# Patient Record
Sex: Male | Born: 1945
Health system: Southern US, Community
[De-identification: ages and names within clinical notes are randomized; demographics above are authoritative.]

## PROBLEM LIST (undated history)

## (undated) DIAGNOSIS — M2550 Pain in unspecified joint: Secondary | ICD-10-CM

## (undated) DIAGNOSIS — F32A Depression, unspecified: Secondary | ICD-10-CM

## (undated) DIAGNOSIS — H269 Unspecified cataract: Secondary | ICD-10-CM

## (undated) DIAGNOSIS — R413 Other amnesia: Secondary | ICD-10-CM

## (undated) DIAGNOSIS — M109 Gout, unspecified: Secondary | ICD-10-CM

## (undated) DIAGNOSIS — F419 Anxiety disorder, unspecified: Secondary | ICD-10-CM

## (undated) DIAGNOSIS — I10 Essential (primary) hypertension: Secondary | ICD-10-CM

## (undated) DIAGNOSIS — K5790 Diverticulosis of intestine, part unspecified, without perforation or abscess without bleeding: Secondary | ICD-10-CM

## (undated) DIAGNOSIS — Z8709 Personal history of other diseases of the respiratory system: Secondary | ICD-10-CM

## (undated) DIAGNOSIS — G5139 Clonic hemifacial spasm, unspecified: Secondary | ICD-10-CM

## (undated) DIAGNOSIS — E079 Disorder of thyroid, unspecified: Secondary | ICD-10-CM

## (undated) DIAGNOSIS — M1990 Unspecified osteoarthritis, unspecified site: Secondary | ICD-10-CM

## (undated) HISTORY — DX: Gout, unspecified: M10.9

## (undated) HISTORY — DX: Unspecified cataract: H26.9

## (undated) HISTORY — PX: OTHER SURGICAL HISTORY: SHX169

## (undated) HISTORY — PX: JOINT REPLACEMENT: SHX530

## (undated) HISTORY — DX: Depression, unspecified: F32.A

## (undated) HISTORY — DX: Other amnesia: R41.3

## (undated) HISTORY — PX: CATARACT EXTRACTION, BILATERAL: SHX1313

## (undated) HISTORY — PX: HERNIA REPAIR: SHX51

## (undated) HISTORY — DX: Disorder of thyroid, unspecified: E07.9

## (undated) HISTORY — PX: COLONOSCOPY: SHX174

## (undated) HISTORY — DX: Anxiety disorder, unspecified: F41.9

---

## 2001-08-29 ENCOUNTER — Ambulatory Visit (HOSPITAL_COMMUNITY): Admission: RE | Admit: 2001-08-29 | Discharge: 2001-08-29 | Payer: Self-pay | Admitting: Family Medicine

## 2001-08-29 ENCOUNTER — Encounter: Payer: Self-pay | Admitting: Family Medicine

## 2001-09-09 ENCOUNTER — Encounter: Payer: Self-pay | Admitting: Family Medicine

## 2001-09-09 ENCOUNTER — Ambulatory Visit (HOSPITAL_COMMUNITY): Admission: RE | Admit: 2001-09-09 | Discharge: 2001-09-09 | Payer: Self-pay | Admitting: Family Medicine

## 2003-03-03 ENCOUNTER — Encounter: Payer: Self-pay | Admitting: Orthopaedic Surgery

## 2003-03-03 ENCOUNTER — Ambulatory Visit (HOSPITAL_COMMUNITY): Admission: RE | Admit: 2003-03-03 | Discharge: 2003-03-03 | Payer: Self-pay | Admitting: Orthopaedic Surgery

## 2003-04-06 ENCOUNTER — Ambulatory Visit (HOSPITAL_COMMUNITY): Admission: RE | Admit: 2003-04-06 | Discharge: 2003-04-06 | Payer: Self-pay | Admitting: Family Medicine

## 2003-04-06 ENCOUNTER — Encounter: Payer: Self-pay | Admitting: Family Medicine

## 2004-05-30 ENCOUNTER — Ambulatory Visit (HOSPITAL_COMMUNITY): Admission: RE | Admit: 2004-05-30 | Discharge: 2004-05-30 | Payer: Self-pay | Admitting: Family Medicine

## 2006-05-29 ENCOUNTER — Ambulatory Visit (HOSPITAL_COMMUNITY): Admission: RE | Admit: 2006-05-29 | Discharge: 2006-05-29 | Payer: Self-pay | Admitting: Family Medicine

## 2006-06-26 ENCOUNTER — Ambulatory Visit (HOSPITAL_COMMUNITY): Admission: RE | Admit: 2006-06-26 | Discharge: 2006-06-26 | Payer: Self-pay | Admitting: General Surgery

## 2010-06-06 ENCOUNTER — Ambulatory Visit (HOSPITAL_COMMUNITY): Admission: RE | Admit: 2010-06-06 | Discharge: 2010-06-06 | Payer: Self-pay | Admitting: Family Medicine

## 2010-12-07 ENCOUNTER — Ambulatory Visit (INDEPENDENT_AMBULATORY_CARE_PROVIDER_SITE_OTHER): Payer: Medicare Other | Admitting: Otolaryngology

## 2010-12-07 DIAGNOSIS — J310 Chronic rhinitis: Secondary | ICD-10-CM

## 2010-12-07 DIAGNOSIS — R070 Pain in throat: Secondary | ICD-10-CM

## 2010-12-07 DIAGNOSIS — R05 Cough: Secondary | ICD-10-CM

## 2011-01-04 ENCOUNTER — Ambulatory Visit (INDEPENDENT_AMBULATORY_CARE_PROVIDER_SITE_OTHER): Payer: Medicare Other | Admitting: Otolaryngology

## 2011-01-04 DIAGNOSIS — H902 Conductive hearing loss, unspecified: Secondary | ICD-10-CM

## 2011-01-04 DIAGNOSIS — H6120 Impacted cerumen, unspecified ear: Secondary | ICD-10-CM

## 2011-01-04 DIAGNOSIS — J310 Chronic rhinitis: Secondary | ICD-10-CM

## 2011-03-09 NOTE — H&P (Signed)
NAME:  Erik Odonnell, Erik Odonnell                ACCOUNT NO.:  [id number]   MEDICAL RECORD NO.:  [id number]          PATIENT TYPE:  AMB   LOCATION:                                FACILITY:  [facility]   PHYSICIAN:  Dalia Heading, M.D.  DATE OF BIRTH:  [birth year]   DATE OF ADMISSION:  DATE OF DISCHARGE:  LH                                HISTORY & PHYSICAL   CHIEF COMPLAINT:  Need for screening colonoscopy.   HISTORY OF PRESENT ILLNESS:  Patient is a 65 year old white male who is  referred for endoscopic evaluation.  He needs a colonoscopy for screening  purposes.  No abdominal pain, weight loss, nausea, vomiting, diarrhea,  constipation, melena, hematochezia have been noted.  He has never had a  colonoscopy.  There is no family history of colon carcinoma.   PAST MEDICAL HISTORY:  Hypertension and gout.   PAST SURGICAL HISTORY:  Eye surgery, herniorrhaphy.   CURRENT MEDICATIONS:  Ziac, allopurinol, Artane, baby aspirin.   ALLERGIES:  No known drug allergies.   REVIEW OF SYSTEMS:  Noncontributory.   PHYSICAL EXAMINATION:  GENERAL:  Patient is a well-developed, well-nourished  white male in no acute distress.  LUNGS:  Clear to auscultation with equal breath sounds bilaterally.  HEART:  Regular rate and rhythm without S3, S4, or murmurs.  ABDOMEN:  Soft, nontender, nondistended.  No hepatosplenomegaly or masses  noted.  RECTAL:  Deferred to the procedure.   IMPRESSION:  Need for screening colonoscopy.   PLAN:  The patient is scheduled for a colonoscopy on June 20, 2006.  The  risks and benefits of the procedure including bleeding and perforation were  fully explained to the patient, gave informed consent.      Dalia Heading, M.D.  Electronically Signed     MAJ/MEDQ  D:  06/11/2006  T:  06/11/2006  Job:  [id number]   cc:   Kirk Ruths, M.D.  Fax: [phone]

## 2012-02-04 ENCOUNTER — Other Ambulatory Visit (HOSPITAL_COMMUNITY): Payer: Self-pay | Admitting: Family Medicine

## 2012-02-04 ENCOUNTER — Ambulatory Visit (HOSPITAL_COMMUNITY)
Admission: RE | Admit: 2012-02-04 | Discharge: 2012-02-04 | Disposition: A | Discharge status: Home / Self Care | Payer: Medicare Other | Source: Ambulatory Visit | Attending: Family Medicine | Admitting: Family Medicine

## 2012-02-04 DIAGNOSIS — R05 Cough: Secondary | ICD-10-CM

## 2012-02-04 DIAGNOSIS — J309 Allergic rhinitis, unspecified: Secondary | ICD-10-CM | POA: Insufficient documentation

## 2012-02-04 DIAGNOSIS — I10 Essential (primary) hypertension: Secondary | ICD-10-CM | POA: Insufficient documentation

## 2012-02-04 DIAGNOSIS — R059 Cough, unspecified: Secondary | ICD-10-CM | POA: Insufficient documentation

## 2012-12-16 ENCOUNTER — Other Ambulatory Visit (HOSPITAL_COMMUNITY): Payer: Self-pay | Admitting: Family Medicine

## 2013-02-19 ENCOUNTER — Ambulatory Visit (HOSPITAL_COMMUNITY)
Admission: RE | Admit: 2013-02-19 | Discharge: 2013-02-19 | Disposition: A | Discharge status: Home / Self Care | Payer: Medicare Other | Source: Ambulatory Visit | Attending: Family Medicine | Admitting: Family Medicine

## 2013-02-19 DIAGNOSIS — Z1389 Encounter for screening for other disorder: Secondary | ICD-10-CM | POA: Insufficient documentation

## 2013-02-19 DIAGNOSIS — Z0000 Encounter for general adult medical examination without abnormal findings: Secondary | ICD-10-CM

## 2013-02-19 DIAGNOSIS — I77819 Aortic ectasia, unspecified site: Secondary | ICD-10-CM | POA: Insufficient documentation

## 2013-02-19 DIAGNOSIS — I10 Essential (primary) hypertension: Secondary | ICD-10-CM | POA: Insufficient documentation

## 2013-03-23 IMAGING — CR DG CHEST 2V
2 series · 2 of 2 positions shown · non-contrast
Comparison: 06/06/2010

CLINICAL DATA: Cough and allergic rhinitis

CHEST - 2 VIEW

[view not recorded (1 of 2)]
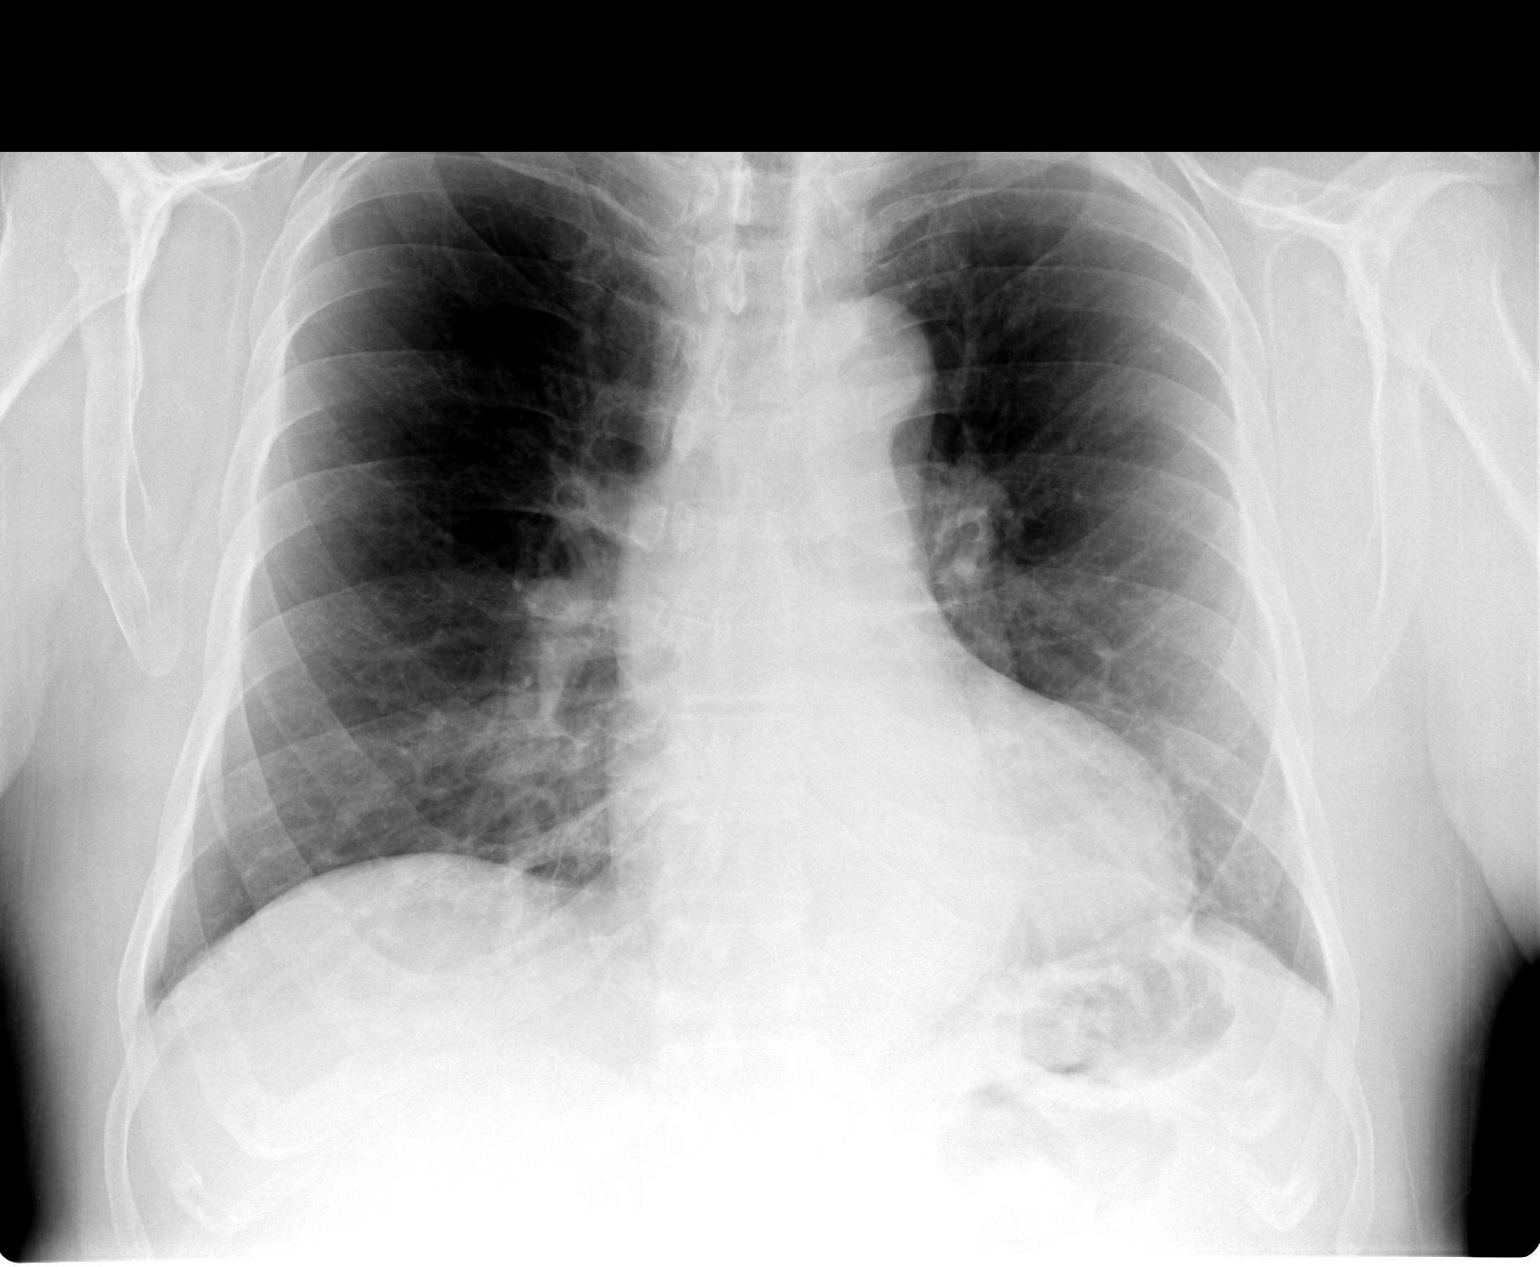

[view not recorded (2 of 2)]
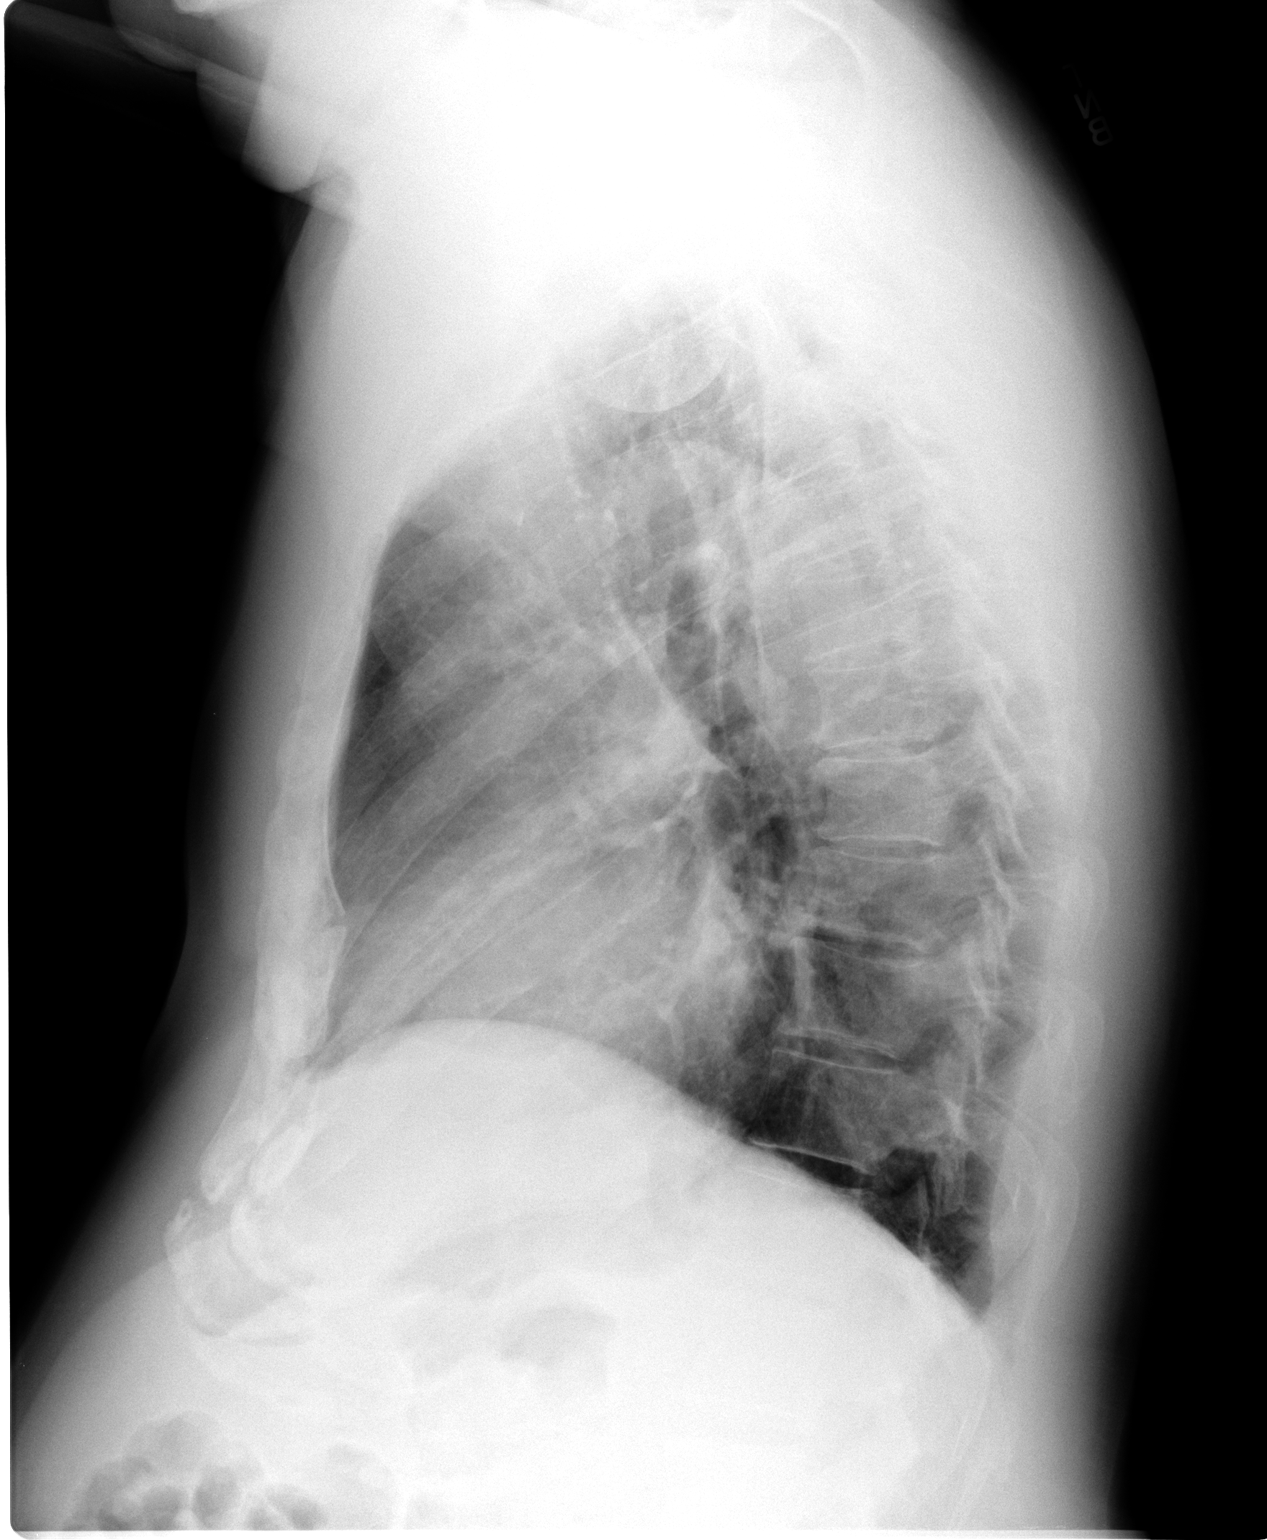

[2 of 2 positions shown; findings below may reference images not displayed]

FINDINGS: The heart size and mediastinal contours are within normal
limits.  Both lungs are clear.  The visualized skeletal structures
are unremarkable.
IMPRESSION: No active cardiopulmonary abnormalities.

## 2013-11-22 DIAGNOSIS — Z8709 Personal history of other diseases of the respiratory system: Secondary | ICD-10-CM

## 2013-11-22 HISTORY — DX: Personal history of other diseases of the respiratory system: Z87.09

## 2013-11-23 ENCOUNTER — Other Ambulatory Visit (HOSPITAL_COMMUNITY): Payer: Self-pay | Admitting: Internal Medicine

## 2013-11-23 ENCOUNTER — Ambulatory Visit (HOSPITAL_COMMUNITY)
Admission: RE | Admit: 2013-11-23 | Discharge: 2013-11-23 | Disposition: A | Discharge status: Home / Self Care | Payer: Medicare Other | Source: Ambulatory Visit | Attending: Internal Medicine | Admitting: Internal Medicine

## 2013-11-23 DIAGNOSIS — R05 Cough: Secondary | ICD-10-CM

## 2013-11-23 DIAGNOSIS — R059 Cough, unspecified: Secondary | ICD-10-CM

## 2013-11-23 DIAGNOSIS — R0602 Shortness of breath: Secondary | ICD-10-CM | POA: Insufficient documentation

## 2013-11-23 DIAGNOSIS — R079 Chest pain, unspecified: Secondary | ICD-10-CM | POA: Insufficient documentation

## 2014-03-08 ENCOUNTER — Other Ambulatory Visit: Payer: Self-pay | Admitting: Physician Assistant

## 2014-03-08 NOTE — H&P (Signed)
TOTAL HIP ADMISSION H&P  Patient is admitted for right total hip arthroplasty.  Subjective:  Chief Complaint: right hip pain  HPI: Erik Odonnell, 68 y.o. male, has a history of pain and functional disability in the right hip(s) due to arthritis and patient has failed non-surgical conservative treatments for greater than 12 weeks to include NSAID's and/or analgesics, corticosteriod injections and activity modification.  Onset of symptoms was abrupt starting 1 years ago with rapidlly worsening course since that time.The patient noted no past surgery on the right hip(s).  Patient currently rates pain in the right hip at 5 out of 10 with activity. Patient has night pain, worsening of pain with activity and weight bearing, trendelenberg gait and pain that interfers with activities of daily living. Patient has evidence of subchondral sclerosis and joint space narrowing by imaging studies. This condition presents safety issues increasing the risk of falls.  There is no current active infection.  There are no active problems to display for this patient.  No past medical history on file.  No past surgical history on file.   (Not in a hospital admission) Allergies not on file  History  Substance Use Topics  . Smoking status: Not on file  . Smokeless tobacco: Not on file  . Alcohol Use: Not on file    No family history on file.   Review of Systems  Constitutional: Negative.   HENT: Negative.   Eyes: Negative.   Respiratory: Negative.   Cardiovascular: Negative.   Gastrointestinal: Negative.   Genitourinary: Negative.   Musculoskeletal: Positive for joint pain.  Skin: Negative.   Neurological: Negative.   Endo/Heme/Allergies: Negative.   Psychiatric/Behavioral: Negative.     Objective:  Physical Exam  Constitutional: He is oriented to person, place, and time. He appears well-developed and well-nourished.  HENT:  Head: Normocephalic and atraumatic.  Eyes: EOM are normal. Pupils are  equal, round, and reactive to light.  Neck: Normal range of motion. Neck supple.  Cardiovascular: Normal rate and regular rhythm.  Exam reveals no gallop and no friction rub.   No murmur heard. Respiratory: Effort normal and breath sounds normal. No respiratory distress. He has no wheezes. He has no rales.  GI: Soft. Bowel sounds are normal. He exhibits no distension. There is no tenderness.  Musculoskeletal:  Antalgic gait Trendelenburg component on the right. Some decreased lumbar motion not extreme. He is neurovascularly intact distally. Markedly positive log roll right hip with very little internal rotation. The left hip has fairly good motion mildly positive log roll nothing extreme. Both knees have good alignment and stability. He is neurovascularly intact distally.  Neurological: He is alert and oriented to person, place, and time.  Skin: Skin is warm and dry.  Psychiatric: He has a normal mood and affect. His behavior is normal. Judgment and thought content normal.    Vital signs in last 24 hours: @VSRANGES @  Labs:   There is no height or weight on file to calculate BMI.   Imaging Review Plain radiographs demonstrate severe degenerative joint disease of the right hip(s). The bone quality appears to be fair for age and reported activity level.  Assessment/Plan:  End stage arthritis, right hip(s)  The patient history, physical examination, clinical judgement of the provider and imaging studies are consistent with end stage degenerative joint disease of the right hip(s) and total hip arthroplasty is deemed medically necessary. The treatment options including medical management, injection therapy, arthroscopy and arthroplasty were discussed at length. The risks and benefits  of total hip arthroplasty were presented and reviewed. The risks due to aseptic loosening, infection, stiffness, dislocation/subluxation,  thromboembolic complications and other imponderables were discussed.  The  patient acknowledged the explanation, agreed to proceed with the plan and consent was signed. Patient is being admitted for inpatient treatment for surgery, pain control, PT, OT, prophylactic antibiotics, VTE prophylaxis, progressive ambulation and ADL's and discharge planning.The patient is planning to be discharged home with home health services

## 2014-03-16 ENCOUNTER — Encounter (HOSPITAL_COMMUNITY): Payer: Self-pay | Admitting: Pharmacy Technician

## 2014-03-17 ENCOUNTER — Encounter (HOSPITAL_COMMUNITY)
Admission: RE | Admit: 2014-03-17 | Discharge: 2014-03-17 | Disposition: A | Discharge status: Home / Self Care | Payer: Medicare Other | Source: Ambulatory Visit | Attending: Orthopedic Surgery | Admitting: Orthopedic Surgery

## 2014-03-17 ENCOUNTER — Encounter (HOSPITAL_COMMUNITY): Payer: Self-pay

## 2014-03-17 DIAGNOSIS — Z01812 Encounter for preprocedural laboratory examination: Secondary | ICD-10-CM | POA: Insufficient documentation

## 2014-03-17 HISTORY — DX: Unspecified osteoarthritis, unspecified site: M19.90

## 2014-03-17 HISTORY — DX: Personal history of other diseases of the respiratory system: Z87.09

## 2014-03-17 HISTORY — DX: Essential (primary) hypertension: I10

## 2014-03-17 HISTORY — DX: Diverticulosis of intestine, part unspecified, without perforation or abscess without bleeding: K57.90

## 2014-03-17 HISTORY — DX: Clonic hemifacial spasm, unspecified: G51.39

## 2014-03-17 HISTORY — DX: Pain in unspecified joint: M25.50

## 2014-03-17 LAB — COMPREHENSIVE METABOLIC PANEL
ALBUMIN: 4.1 g/dL (ref 3.5–5.2)
ALT: 25 U/L (ref 0–53)
AST: 24 U/L (ref 0–37)
Alkaline Phosphatase: 64 U/L (ref 39–117)
BUN: 18 mg/dL (ref 6–23)
CHLORIDE: 96 meq/L (ref 96–112)
CO2: 24 mEq/L (ref 19–32)
CREATININE: 1.03 mg/dL (ref 0.50–1.35)
Calcium: 9.3 mg/dL (ref 8.4–10.5)
GFR calc Af Amer: 84 mL/min — ABNORMAL LOW (ref >90)
GFR calc non Af Amer: 72 mL/min — ABNORMAL LOW (ref >90)
GLUCOSE: 95 mg/dL (ref 70–99)
Potassium: 4.2 mEq/L (ref 3.7–5.3)
Sodium: 134 mEq/L — ABNORMAL LOW (ref 137–147)
Total Bilirubin: 0.5 mg/dL (ref 0.3–1.2)
Total Protein: 7.5 g/dL (ref 6.0–8.3)

## 2014-03-17 LAB — CBC WITH DIFFERENTIAL/PLATELET
BASOS PCT: 1 % (ref 0–1)
Basophils Absolute: 0.1 10*3/uL (ref 0.0–0.1)
EOS ABS: 0.2 10*3/uL (ref 0.0–0.7)
Eosinophils Relative: 3 % (ref 0–5)
HEMATOCRIT: 45.1 % (ref 39.0–52.0)
HEMOGLOBIN: 16 g/dL (ref 13.0–17.0)
LYMPHS ABS: 1.6 10*3/uL (ref 0.7–4.0)
Lymphocytes Relative: 22 % (ref 12–46)
MCH: 34.3 pg — AB (ref 26.0–34.0)
MCHC: 35.5 g/dL (ref 30.0–36.0)
MCV: 96.6 fL (ref 78.0–100.0)
MONO ABS: 0.6 10*3/uL (ref 0.1–1.0)
MONOS PCT: 8 % (ref 3–12)
NEUTROS ABS: 4.8 10*3/uL (ref 1.7–7.7)
Neutrophils Relative %: 66 % (ref 43–77)
Platelets: 198 10*3/uL (ref 150–400)
RBC: 4.67 MIL/uL (ref 4.22–5.81)
RDW: 13.7 % (ref 11.5–15.5)
WBC: 7.3 10*3/uL (ref 4.0–10.5)

## 2014-03-17 LAB — URINALYSIS, ROUTINE W REFLEX MICROSCOPIC
Bilirubin Urine: NEGATIVE
Glucose, UA: NEGATIVE mg/dL
Hgb urine dipstick: NEGATIVE
KETONES UR: NEGATIVE mg/dL
Leukocytes, UA: NEGATIVE
NITRITE: NEGATIVE
PH: 6.5 (ref 5.0–8.0)
Protein, ur: NEGATIVE mg/dL
Specific Gravity, Urine: 1.016 (ref 1.005–1.030)
Urobilinogen, UA: 0.2 mg/dL (ref 0.0–1.0)

## 2014-03-17 LAB — ABO/RH: ABO/RH(D): O POS

## 2014-03-17 LAB — PROTIME-INR
INR: 0.98 (ref 0.00–1.49)
PROTHROMBIN TIME: 12.8 s (ref 11.6–15.2)

## 2014-03-17 LAB — SURGICAL PCR SCREEN
MRSA, PCR: NEGATIVE
Staphylococcus aureus: NEGATIVE

## 2014-03-17 LAB — TYPE AND SCREEN
ABO/RH(D): O POS
Antibody Screen: NEGATIVE

## 2014-03-17 LAB — APTT: aPTT: 32 seconds (ref 24–37)

## 2014-03-17 MED ORDER — CHLORHEXIDINE GLUCONATE 4 % EX LIQD: 60.0000 mL | Freq: Once | CUTANEOUS | Status: DC

## 2014-03-17 NOTE — Progress Notes (Signed)
03/17/14 1154  OBSTRUCTIVE SLEEP APNEA  Have you ever been diagnosed with sleep apnea through a sleep study? No  Do you snore loudly (loud enough to be heard through closed doors)?  0  Do you often feel tired, fatigued, or sleepy during the daytime? 0  Has anyone observed you stop breathing during your sleep? 0  Do you have, or are you being treated for high blood pressure? 1  BMI more than 35 kg/m2? 0  Age over 70 years old? 1  Neck circumference greater than 40 cm/16 inches? 1 (18.5)  Gender: 1  Obstructive Sleep Apnea Score 4  Score 4 or greater  Results sent to PCP

## 2014-03-17 NOTE — Pre-Procedure Instructions (Signed)
OLLY SHINER  03/17/2014   Your procedure is scheduled on:  Wed, June 3 @ 11:00 AM  Report to [facility]  at 8:00 AM.  Call this number if you have problems the morning of surgery: [phone]   Remember:   Do not eat food or drink liquids after midnight.   Take these medicines the morning of surgery with A SIP OF WATER: Allopurinol(Zyloprim) and Bisoprolol-HCTZ(Ziac)               Stop taking your Aspirin.No Goody's,BC's,Aleve,Ibuprofen,Fish Oil,or any Herbal Medications   Do not wear jewelry  Do not wear lotions, powders, or colognes. You may wear deodorant.  Men may shave face and neck.  Do not bring valuables to the hospital.  [facility] is not responsible                  for any belongings or valuables.               Contacts, dentures or bridgework may not be worn into surgery.  Leave suitcase in the car. After surgery it may be brought to your room.  For patients admitted to the hospital, discharge time is determined by your                treatment team.               Patients discharged the day of surgery will not be allowed to drive  home.    Special Instructions:   - Preparing for Surgery  Before surgery, you can play an important role.  Because skin is not sterile, your skin needs to be as free of germs as possible.  You can reduce the number of germs on you skin by washing with CHG (chlorahexidine gluconate) soap before surgery.  CHG is an antiseptic cleaner which kills germs and bonds with the skin to continue killing germs even after washing.  Please DO NOT use if you have an allergy to CHG or antibacterial soaps.  If your skin becomes reddened/irritated stop using the CHG and inform your nurse when you arrive at Short Stay.  Do not shave (including legs and underarms) for at least 48 hours prior to the first CHG shower.  You may shave your face.  Please follow these instructions carefully:   1.  Shower with CHG Soap the night before  surgery and the                                morning of Surgery.  2.  If you choose to wash your hair, wash your hair first as usual with your       normal shampoo.  3.  After you shampoo, rinse your hair and body thoroughly to remove the                      Shampoo.  4.  Use CHG as you would any other liquid soap.  You can apply chg directly       to the skin and wash gently with scrungie or a clean washcloth.  5.  Apply the CHG Soap to your body ONLY FROM THE NECK DOWN.        Do not use on open wounds or open sores.  Avoid contact with your eyes,       ears, mouth and genitals (private parts).  Wash genitals (  private parts)       with your normal soap.  6.  Wash thoroughly, paying special attention to the area where your surgery        will be performed.  7.  Thoroughly rinse your body with warm water from the neck down.  8.  DO NOT shower/wash with your normal soap after using and rinsing off       the CHG Soap.  9.  Pat yourself dry with a clean towel.            10.  Wear clean pajamas.            11.  Place clean sheets on your bed the night of your first shower and do not        sleep with pets.  Day of Surgery  Do not apply any lotions/deoderants the morning of surgery.  Please wear clean clothes to the hospital/surgery center.     Please read over the following fact sheets that you were given: Pain Booklet, Coughing and Deep Breathing, Blood Transfusion Information, MRSA Information and Surgical Site Infection Prevention

## 2014-03-17 NOTE — Progress Notes (Addendum)
Pt doesn't have a cardiologist  Stress test done 12+yrs ago    Denies ever having a heart cath or echo  CXR in epic from 11-23-13  EKG to be requested from [place] with [facility]

## 2014-03-18 LAB — URINE CULTURE
CULTURE: NO GROWTH
Colony Count: NO GROWTH

## 2014-03-23 MED ORDER — CEFAZOLIN SODIUM-DEXTROSE 2-3 GM-% IV SOLR
2.0000 g | INTRAVENOUS | Status: AC
  Administered 2014-03-24: 2 g via INTRAVENOUS
  Filled 2014-03-23: qty 50

## 2014-03-23 NOTE — Progress Notes (Signed)
Patient called with time change. To arrive at 645am

## 2014-03-24 ENCOUNTER — Encounter (HOSPITAL_COMMUNITY): Payer: Self-pay | Admitting: Certified Registered Nurse Anesthetist

## 2014-03-24 ENCOUNTER — Encounter (HOSPITAL_COMMUNITY): Payer: Medicare Other | Admitting: Anesthesiology

## 2014-03-24 ENCOUNTER — Inpatient Hospital Stay (HOSPITAL_COMMUNITY): Payer: Medicare Other | Admitting: Anesthesiology

## 2014-03-24 ENCOUNTER — Encounter (HOSPITAL_COMMUNITY)
Admission: RE | Disposition: A | Discharge status: Home Health | Payer: Self-pay | Source: Ambulatory Visit | Attending: Orthopedic Surgery

## 2014-03-24 ENCOUNTER — Inpatient Hospital Stay (HOSPITAL_COMMUNITY): Payer: Medicare Other

## 2014-03-24 ENCOUNTER — Inpatient Hospital Stay (HOSPITAL_COMMUNITY)
Admission: RE | Admit: 2014-03-24 | Discharge: 2014-03-25 | DRG: 470 | Disposition: A | Discharge status: Home Health | Payer: Medicare Other | Source: Ambulatory Visit | Attending: Orthopedic Surgery | Admitting: Orthopedic Surgery

## 2014-03-24 DIAGNOSIS — G518 Other disorders of facial nerve: Secondary | ICD-10-CM | POA: Diagnosis present

## 2014-03-24 DIAGNOSIS — I10 Essential (primary) hypertension: Secondary | ICD-10-CM | POA: Diagnosis present

## 2014-03-24 DIAGNOSIS — Z96649 Presence of unspecified artificial hip joint: Secondary | ICD-10-CM

## 2014-03-24 DIAGNOSIS — M1610 Unilateral primary osteoarthritis, unspecified hip: Principal | ICD-10-CM | POA: Diagnosis present

## 2014-03-24 DIAGNOSIS — M169 Osteoarthritis of hip, unspecified: Principal | ICD-10-CM | POA: Diagnosis present

## 2014-03-24 HISTORY — PX: TOTAL HIP ARTHROPLASTY: SHX124

## 2014-03-24 SURGERY — ARTHROPLASTY, HIP, TOTAL, ANTERIOR APPROACH
Anesthesia: General | Laterality: Right

## 2014-03-24 MED ORDER — METOCLOPRAMIDE HCL 10 MG PO TABS: 5.0000 mg | ORAL_TABLET | Freq: Three times a day (TID) | ORAL | Status: DC | PRN

## 2014-03-24 MED ORDER — NEOSTIGMINE METHYLSULFATE 10 MG/10ML IV SOLN
INTRAVENOUS | Status: DC | PRN
  Administered 2014-03-24: 4 mg via INTRAVENOUS

## 2014-03-24 MED ORDER — DOCUSATE SODIUM 100 MG PO CAPS
100.0000 mg | ORAL_CAPSULE | Freq: Two times a day (BID) | ORAL | Status: DC
  Administered 2014-03-24 – 2014-03-25 (×2): 100 mg via ORAL
  Filled 2014-03-24 (×2): qty 1

## 2014-03-24 MED ORDER — LACTATED RINGERS IV SOLN
INTRAVENOUS | Status: DC | PRN
  Administered 2014-03-24 (×2): via INTRAVENOUS

## 2014-03-24 MED ORDER — ACETAMINOPHEN 650 MG RE SUPP: 650.0000 mg | Freq: Four times a day (QID) | RECTAL | Status: DC | PRN

## 2014-03-24 MED ORDER — ROCURONIUM BROMIDE 50 MG/5ML IV SOLN
INTRAVENOUS | Status: AC
  Filled 2014-03-24: qty 1

## 2014-03-24 MED ORDER — FENTANYL CITRATE 0.05 MG/ML IJ SOLN
INTRAMUSCULAR | Status: AC
  Filled 2014-03-24: qty 5

## 2014-03-24 MED ORDER — CELECOXIB 200 MG PO CAPS
200.0000 mg | ORAL_CAPSULE | Freq: Two times a day (BID) | ORAL | Status: DC
  Administered 2014-03-24 – 2014-03-25 (×2): 200 mg via ORAL
  Filled 2014-03-24 (×4): qty 1

## 2014-03-24 MED ORDER — ONDANSETRON HCL 4 MG PO TABS: 4.0000 mg | ORAL_TABLET | Freq: Four times a day (QID) | ORAL | Status: DC | PRN

## 2014-03-24 MED ORDER — 0.9 % SODIUM CHLORIDE (POUR BTL) OPTIME
TOPICAL | Status: DC | PRN
  Administered 2014-03-24: 1000 mL

## 2014-03-24 MED ORDER — BUPIVACAINE LIPOSOME 1.3 % IJ SUSP
INTRAMUSCULAR | Status: DC | PRN
  Administered 2014-03-24: 20 mL

## 2014-03-24 MED ORDER — OXYCODONE-ACETAMINOPHEN 5-325 MG PO TABS: 1.0000 | ORAL_TABLET | ORAL | Status: DC | PRN

## 2014-03-24 MED ORDER — ALUM & MAG HYDROXIDE-SIMETH 200-200-20 MG/5ML PO SUSP: 30.0000 mL | ORAL | Status: DC | PRN

## 2014-03-24 MED ORDER — METHOCARBAMOL 1000 MG/10ML IJ SOLN
500.0000 mg | Freq: Four times a day (QID) | INTRAVENOUS | Status: DC | PRN
  Filled 2014-03-24: qty 5

## 2014-03-24 MED ORDER — ASPIRIN EC 325 MG PO TBEC
325.0000 mg | DELAYED_RELEASE_TABLET | Freq: Every day | ORAL | Status: DC
  Administered 2014-03-25: 325 mg via ORAL
  Filled 2014-03-24 (×2): qty 1

## 2014-03-24 MED ORDER — OXYCODONE HCL 5 MG PO TABS: 5.0000 mg | ORAL_TABLET | Freq: Once | ORAL | Status: DC | PRN

## 2014-03-24 MED ORDER — FENTANYL CITRATE 0.05 MG/ML IJ SOLN
INTRAMUSCULAR | Status: DC | PRN
  Administered 2014-03-24: 50 ug via INTRAVENOUS
  Administered 2014-03-24: 100 ug via INTRAVENOUS
  Administered 2014-03-24 (×2): 50 ug via INTRAVENOUS
  Administered 2014-03-24: 100 ug via INTRAVENOUS

## 2014-03-24 MED ORDER — ALBUMIN HUMAN 5 % IV SOLN
INTRAVENOUS | Status: DC | PRN
  Administered 2014-03-24: 12:00:00 via INTRAVENOUS

## 2014-03-24 MED ORDER — PROMETHAZINE HCL 25 MG/ML IJ SOLN: 6.2500 mg | INTRAMUSCULAR | Status: DC | PRN

## 2014-03-24 MED ORDER — MIDAZOLAM HCL 2 MG/2ML IJ SOLN
INTRAMUSCULAR | Status: AC
  Filled 2014-03-24: qty 2

## 2014-03-24 MED ORDER — TRIHEXYPHENIDYL HCL 2 MG PO TABS
2.0000 mg | ORAL_TABLET | Freq: Every day | ORAL | Status: DC
  Administered 2014-03-24 – 2014-03-25 (×2): 2 mg via ORAL
  Filled 2014-03-24 (×2): qty 1

## 2014-03-24 MED ORDER — METHOCARBAMOL 500 MG PO TABS
500.0000 mg | ORAL_TABLET | Freq: Four times a day (QID) | ORAL | Status: DC | PRN
  Administered 2014-03-24 (×2): 500 mg via ORAL
  Filled 2014-03-24: qty 1

## 2014-03-24 MED ORDER — SODIUM CHLORIDE 0.9 % IR SOLN
Status: DC | PRN
  Administered 2014-03-24: 40 mL

## 2014-03-24 MED ORDER — ROCURONIUM BROMIDE 100 MG/10ML IV SOLN
INTRAVENOUS | Status: DC | PRN
  Administered 2014-03-24: 50 mg via INTRAVENOUS
  Administered 2014-03-24 (×2): 10 mg via INTRAVENOUS
  Administered 2014-03-24: 5 mg via INTRAVENOUS

## 2014-03-24 MED ORDER — ONDANSETRON HCL 4 MG/2ML IJ SOLN
INTRAMUSCULAR | Status: AC
  Filled 2014-03-24: qty 2

## 2014-03-24 MED ORDER — ONDANSETRON HCL 4 MG/2ML IJ SOLN
INTRAMUSCULAR | Status: DC | PRN
  Administered 2014-03-24: 4 mg via INTRAVENOUS

## 2014-03-24 MED ORDER — METHOCARBAMOL 500 MG PO TABS: 500.0000 mg | ORAL_TABLET | Freq: Four times a day (QID) | ORAL | Status: DC

## 2014-03-24 MED ORDER — CEFAZOLIN SODIUM 1-5 GM-% IV SOLN
1.0000 g | Freq: Four times a day (QID) | INTRAVENOUS | Status: AC
  Administered 2014-03-24 (×2): 1 g via INTRAVENOUS
  Filled 2014-03-24 (×2): qty 50

## 2014-03-24 MED ORDER — SODIUM CHLORIDE 0.9 % IV SOLN
1000.0000 mg | INTRAVENOUS | Status: AC
  Administered 2014-03-24: 1000 mg via INTRAVENOUS
  Filled 2014-03-24: qty 10

## 2014-03-24 MED ORDER — DEXAMETHASONE SODIUM PHOSPHATE 4 MG/ML IJ SOLN
INTRAMUSCULAR | Status: DC | PRN
  Administered 2014-03-24: 8 mg via INTRAVENOUS

## 2014-03-24 MED ORDER — METOCLOPRAMIDE HCL 5 MG/ML IJ SOLN: 5.0000 mg | Freq: Three times a day (TID) | INTRAMUSCULAR | Status: DC | PRN

## 2014-03-24 MED ORDER — BISACODYL 5 MG PO TBEC: 5.0000 mg | DELAYED_RELEASE_TABLET | Freq: Every day | ORAL | Status: DC | PRN

## 2014-03-24 MED ORDER — POTASSIUM CHLORIDE IN NACL 20-0.9 MEQ/L-% IV SOLN
INTRAVENOUS | Status: DC
  Administered 2014-03-24: 16:00:00 via INTRAVENOUS
  Filled 2014-03-24 (×3): qty 1000

## 2014-03-24 MED ORDER — BISOPROLOL-HYDROCHLOROTHIAZIDE 2.5-6.25 MG PO TABS
1.0000 | ORAL_TABLET | Freq: Every day | ORAL | Status: DC
  Administered 2014-03-25: 1 via ORAL
  Filled 2014-03-24: qty 1

## 2014-03-24 MED ORDER — MENTHOL 3 MG MT LOZG: 1.0000 | LOZENGE | OROMUCOSAL | Status: DC | PRN

## 2014-03-24 MED ORDER — NEOSTIGMINE METHYLSULFATE 10 MG/10ML IV SOLN
INTRAVENOUS | Status: AC
  Filled 2014-03-24: qty 1

## 2014-03-24 MED ORDER — ACETAMINOPHEN 325 MG PO TABS: 650.0000 mg | ORAL_TABLET | Freq: Four times a day (QID) | ORAL | Status: DC | PRN

## 2014-03-24 MED ORDER — BUPIVACAINE LIPOSOME 1.3 % IJ SUSP
20.0000 mL | INTRAMUSCULAR | Status: DC
  Filled 2014-03-24: qty 20

## 2014-03-24 MED ORDER — ASPIRIN EC 325 MG PO TBEC: 325.0000 mg | DELAYED_RELEASE_TABLET | Freq: Every day | ORAL | Status: DC

## 2014-03-24 MED ORDER — MIDAZOLAM HCL 5 MG/5ML IJ SOLN
INTRAMUSCULAR | Status: DC | PRN
  Administered 2014-03-24: 2 mg via INTRAVENOUS

## 2014-03-24 MED ORDER — GLYCOPYRROLATE 0.2 MG/ML IJ SOLN
INTRAMUSCULAR | Status: AC
  Filled 2014-03-24: qty 3

## 2014-03-24 MED ORDER — GLYCOPYRROLATE 0.2 MG/ML IJ SOLN
INTRAMUSCULAR | Status: DC | PRN
  Administered 2014-03-24: 0.6 mg via INTRAVENOUS

## 2014-03-24 MED ORDER — LIDOCAINE HCL (CARDIAC) 20 MG/ML IV SOLN
INTRAVENOUS | Status: AC
  Filled 2014-03-24: qty 5

## 2014-03-24 MED ORDER — LACTATED RINGERS IV SOLN: INTRAVENOUS | Status: DC

## 2014-03-24 MED ORDER — PROPOFOL 10 MG/ML IV BOLUS
INTRAVENOUS | Status: AC
  Filled 2014-03-24: qty 20

## 2014-03-24 MED ORDER — OXYCODONE HCL 5 MG PO TABS
5.0000 mg | ORAL_TABLET | ORAL | Status: DC | PRN
  Administered 2014-03-24: 10 mg via ORAL
  Administered 2014-03-25: 5 mg via ORAL
  Filled 2014-03-24 (×2): qty 2

## 2014-03-24 MED ORDER — LACTATED RINGERS IV SOLN
INTRAVENOUS | Status: DC
  Administered 2014-03-24: 09:00:00 via INTRAVENOUS

## 2014-03-24 MED ORDER — PHENOL 1.4 % MT LIQD: 1.0000 | OROMUCOSAL | Status: DC | PRN

## 2014-03-24 MED ORDER — ONDANSETRON HCL 4 MG PO TABS: 4.0000 mg | ORAL_TABLET | Freq: Three times a day (TID) | ORAL | Status: DC | PRN

## 2014-03-24 MED ORDER — OXYCODONE HCL 5 MG/5ML PO SOLN: 5.0000 mg | Freq: Once | ORAL | Status: DC | PRN

## 2014-03-24 MED ORDER — METHOCARBAMOL 500 MG PO TABS
ORAL_TABLET | ORAL | Status: AC
  Filled 2014-03-24: qty 1

## 2014-03-24 MED ORDER — PROPOFOL 10 MG/ML IV BOLUS
INTRAVENOUS | Status: DC | PRN
  Administered 2014-03-24: 200 mg via INTRAVENOUS

## 2014-03-24 MED ORDER — DIPHENHYDRAMINE HCL 12.5 MG/5ML PO ELIX: 12.5000 mg | ORAL_SOLUTION | ORAL | Status: DC | PRN

## 2014-03-24 MED ORDER — ALLOPURINOL 300 MG PO TABS
300.0000 mg | ORAL_TABLET | Freq: Every day | ORAL | Status: DC
  Administered 2014-03-25: 300 mg via ORAL
  Filled 2014-03-24: qty 1

## 2014-03-24 MED ORDER — HYDROMORPHONE HCL PF 1 MG/ML IJ SOLN
0.5000 mg | INTRAMUSCULAR | Status: DC | PRN
  Administered 2014-03-24: 1 mg via INTRAVENOUS
  Filled 2014-03-24: qty 1

## 2014-03-24 MED ORDER — LIDOCAINE HCL (CARDIAC) 20 MG/ML IV SOLN
INTRAVENOUS | Status: DC | PRN
  Administered 2014-03-24: 100 mg via INTRAVENOUS

## 2014-03-24 MED ORDER — EPHEDRINE SULFATE 50 MG/ML IJ SOLN
INTRAMUSCULAR | Status: DC | PRN
  Administered 2014-03-24 (×2): 10 mg via INTRAVENOUS
  Administered 2014-03-24 (×2): 5 mg via INTRAVENOUS

## 2014-03-24 MED ORDER — HYDROMORPHONE HCL PF 1 MG/ML IJ SOLN: 0.2500 mg | INTRAMUSCULAR | Status: DC | PRN

## 2014-03-24 MED ORDER — ONDANSETRON HCL 4 MG/2ML IJ SOLN: 4.0000 mg | Freq: Four times a day (QID) | INTRAMUSCULAR | Status: DC | PRN

## 2014-03-24 SURGICAL SUPPLY — 53 items
BLADE SAW SGTL 18X1.27X75 (BLADE) ×2 IMPLANT
BLADE SAW SGTL 18X1.27X75MM (BLADE) ×1
BLADE SURG ROTATE 9660 (MISCELLANEOUS) IMPLANT
CATH URET WHISTLE 8FR 331008 (CATHETERS) ×3 IMPLANT
CLOSURE STERI-STRIP 1/2X4 (GAUZE/BANDAGES/DRESSINGS) ×1
CLSR STERI-STRIP ANTIMIC 1/2X4 (GAUZE/BANDAGES/DRESSINGS) ×2 IMPLANT
COVER SURGICAL LIGHT HANDLE (MISCELLANEOUS) ×3 IMPLANT
DRAPE IMP U-DRAPE 54X76 (DRAPES) ×6 IMPLANT
DRAPE INCISE IOBAN 66X45 STRL (DRAPES) ×3 IMPLANT
DRAPE ORTHO SPLIT 77X108 STRL (DRAPES) ×4
DRAPE PROXIMA HALF (DRAPES) ×3 IMPLANT
DRAPE SURG 17X23 STRL (DRAPES) ×3 IMPLANT
DRAPE SURG ORHT 6 SPLT 77X108 (DRAPES) ×2 IMPLANT
DRAPE U-SHAPE 47X51 STRL (DRAPES) ×3 IMPLANT
DRSG AQUACEL AG ADV 3.5X10 (GAUZE/BANDAGES/DRESSINGS) ×3 IMPLANT
DURAPREP 26ML APPLICATOR (WOUND CARE) ×3 IMPLANT
ELECT BLADE 4.0 EZ CLEAN MEGAD (MISCELLANEOUS)
ELECT CAUTERY BLADE 6.4 (BLADE) ×3 IMPLANT
ELECT REM PT RETURN 9FT ADLT (ELECTROSURGICAL) ×3
ELECTRODE BLDE 4.0 EZ CLN MEGD (MISCELLANEOUS) IMPLANT
ELECTRODE REM PT RTRN 9FT ADLT (ELECTROSURGICAL) ×1 IMPLANT
FACESHIELD WRAPAROUND (MASK) ×6 IMPLANT
GLOVE BIOGEL PI IND STRL 7.0 (GLOVE) IMPLANT
GLOVE BIOGEL PI INDICATOR 7.0 (GLOVE)
GLOVE ECLIPSE 6.5 STRL STRAW (GLOVE) IMPLANT
GLOVE ORTHO TXT STRL SZ7.5 (GLOVE) ×3 IMPLANT
GOWN STRL REUS W/ TWL LRG LVL3 (GOWN DISPOSABLE) ×3 IMPLANT
GOWN STRL REUS W/ TWL XL LVL3 (GOWN DISPOSABLE) ×1 IMPLANT
GOWN STRL REUS W/TWL LRG LVL3 (GOWN DISPOSABLE) ×6
GOWN STRL REUS W/TWL XL LVL3 (GOWN DISPOSABLE) ×2
HANDPIECE INTERPULSE COAX TIP (DISPOSABLE)
HIP/CERM HD VIT E LINR LEV 1C ×3 IMPLANT
KIT BASIN OR (CUSTOM PROCEDURE TRAY) ×3 IMPLANT
KIT ROOM TURNOVER OR (KITS) ×3 IMPLANT
MANIFOLD NEPTUNE II (INSTRUMENTS) ×3 IMPLANT
NDL SAFETY ECLIPSE 18X1.5 (NEEDLE) ×1 IMPLANT
NEEDLE HYPO 18GX1.5 SHARP (NEEDLE) ×2
NS IRRIG 1000ML POUR BTL (IV SOLUTION) ×3 IMPLANT
PACK TOTAL JOINT (CUSTOM PROCEDURE TRAY) ×3 IMPLANT
PAD ARMBOARD 7.5X6 YLW CONV (MISCELLANEOUS) ×6 IMPLANT
SET HNDPC FAN SPRY TIP SCT (DISPOSABLE) IMPLANT
SUT ETHIBOND NAB CT1 #1 30IN (SUTURE) IMPLANT
SUT MNCRL AB 4-0 PS2 18 (SUTURE) ×3 IMPLANT
SUT MON AB 2-0 CT1 27 (SUTURE) ×3 IMPLANT
SUT VIC AB 0 CT1 27 (SUTURE) ×2
SUT VIC AB 0 CT1 27XBRD ANBCTR (SUTURE) ×1 IMPLANT
SUT VIC AB 1 CT1 27 (SUTURE) ×2
SUT VIC AB 1 CT1 27XBRD ANBCTR (SUTURE) ×1 IMPLANT
SYR 50ML LL SCALE MARK (SYRINGE) ×3 IMPLANT
TOWEL OR 17X24 6PK STRL BLUE (TOWEL DISPOSABLE) ×3 IMPLANT
TOWEL OR 17X26 10 PK STRL BLUE (TOWEL DISPOSABLE) ×3 IMPLANT
TRAY FOLEY CATH 14FR (SET/KITS/TRAYS/PACK) IMPLANT
WATER STERILE IRR 1000ML POUR (IV SOLUTION) ×6 IMPLANT

## 2014-03-24 NOTE — Interval H&P Note (Signed)
History and Physical Interval Note:  03/24/2014 7:56 AM  Erik Odonnell  has presented today for surgery, with the diagnosis of DJD RIGHT HIP  The various methods of treatment have been discussed with the patient and family. After consideration of risks, benefits and other options for treatment, the patient has consented to  Procedure(s): TOTAL HIP ARTHROPLASTY ANTERIOR APPROACH (Right) as a surgical intervention .  The patient's history has been reviewed, patient examined, no change in status, stable for surgery.  I have reviewed the patient's chart and labs.  Questions were answered to the patient's satisfaction.     Ninetta Lights

## 2014-03-24 NOTE — Anesthesia Postprocedure Evaluation (Signed)
  Anesthesia Post-op Note  Patient: Erik Odonnell  Procedure(s) Performed: Procedure(s): TOTAL HIP ARTHROPLASTY ANTERIOR APPROACH (Right)  Patient Location: PACU  Anesthesia Type:General  Level of Consciousness: awake and alert   Airway and Oxygen Therapy: Patient Spontanous Breathing  Post-op Pain: mild  Post-op Assessment: Post-op Vital signs reviewed  Post-op Vital Signs: stable  Last Vitals:  Filed Vitals:   03/24/14 1350  BP:   Pulse: 56  Temp:   Resp: 12    Complications: No apparent anesthesia complications

## 2014-03-24 NOTE — Discharge Instructions (Signed)
Total Hip Replacement Care After Refer to this sheet in the next few weeks. These instructions provide you with information on caring for yourself after your procedure. Your caregiver also may give you specific instructions. Your treatment has been planned according to the most current medical practices, but problems sometimes occur. Call your caregiver if you have any problems or questions after your procedure. HOME CARE INSTRUCTIONS   Weight bearing as tolerated.  Take Aspirin 1 tab a day for the next 30 days to prevent blood clots.  May change bandage daily starting on Saturday.  May shower on Monday, but do not soak incision.  May apply ice for up to 20 minutes at a time for pain and swelling.  Follow up appointment in our office in 2 weeks.   Your caregiver will give you specific precautions for certain types of movement. Additional instructions include:  Take over-the-counter or prescription medicines for pain, discomfort, or fever only as directed by your caregiver.  Take quick showers (3 5 min) rather than bathe until your caregiver tells you that you can take baths again.  Avoid lifting until your caregiver instructs you otherwise.  Use a raised toilet seat and avoid sitting in low chairs as instructed by your caregiver.  Use crutches or a walker as instructed by your caregiver. SEEK MEDICAL CARE IF:  You have difficulty breathing.  Your wound is red, swollen, or has become increasingly painful.  You have pus draining from your wound.  You have a bad smell coming from your wound.  You have persistent bleeding from your wound.  Your wound breaks open after sutures (stitches) or staples have been removed. SEEK IMMEDIATE MEDICAL CARE IF:   You have a fever.  You have a rash.  You have pain or swelling in your calf or thigh.  You have shortness of breath or chest pain. MAKE SURE YOU:  Understand these instructions.  Will watch your condition.  Will get help right  away if you are not doing well or get worse. Document Released: 04/27/2005 Document Revised: 04/08/2012 Document Reviewed: 11/25/2011 [facility] Patient Information 2014 [place].

## 2014-03-24 NOTE — H&P (View-Only) (Signed)
TOTAL HIP ADMISSION H&P  Patient is admitted for right total hip arthroplasty.  Subjective:  Chief Complaint: right hip pain  HPI: ACEN CRAUN, 68 y.o. male, has a history of pain and functional disability in the right hip(s) due to arthritis and patient has failed non-surgical conservative treatments for greater than 12 weeks to include NSAID's and/or analgesics, corticosteriod injections and activity modification.  Onset of symptoms was abrupt starting 1 years ago with rapidlly worsening course since that time.The patient noted no past surgery on the right hip(s).  Patient currently rates pain in the right hip at 5 out of 10 with activity. Patient has night pain, worsening of pain with activity and weight bearing, trendelenberg gait and pain that interfers with activities of daily living. Patient has evidence of subchondral sclerosis and joint space narrowing by imaging studies. This condition presents safety issues increasing the risk of falls.  There is no current active infection.  There are no active problems to display for this patient.  No past medical history on file.  No past surgical history on file.   (Not in a hospital admission) Allergies not on file  History  Substance Use Topics  . Smoking status: Not on file  . Smokeless tobacco: Not on file  . Alcohol Use: Not on file    No family history on file.   Review of Systems  Constitutional: Negative.   HENT: Negative.   Eyes: Negative.   Respiratory: Negative.   Cardiovascular: Negative.   Gastrointestinal: Negative.   Genitourinary: Negative.   Musculoskeletal: Positive for joint pain.  Skin: Negative.   Neurological: Negative.   Endo/Heme/Allergies: Negative.   Psychiatric/Behavioral: Negative.     Objective:  Physical Exam  Constitutional: He is oriented to person, place, and time. He appears well-developed and well-nourished.  HENT:  Head: Normocephalic and atraumatic.  Eyes: EOM are normal. Pupils are  equal, round, and reactive to light.  Neck: Normal range of motion. Neck supple.  Cardiovascular: Normal rate and regular rhythm.  Exam reveals no gallop and no friction rub.   No murmur heard. Respiratory: Effort normal and breath sounds normal. No respiratory distress. He has no wheezes. He has no rales.  GI: Soft. Bowel sounds are normal. He exhibits no distension. There is no tenderness.  Musculoskeletal:  Antalgic gait Trendelenburg component on the right. Some decreased lumbar motion not extreme. He is neurovascularly intact distally. Markedly positive log roll right hip with very little internal rotation. The left hip has fairly good motion mildly positive log roll nothing extreme. Both knees have good alignment and stability. He is neurovascularly intact distally.  Neurological: He is alert and oriented to person, place, and time.  Skin: Skin is warm and dry.  Psychiatric: He has a normal mood and affect. His behavior is normal. Judgment and thought content normal.    Vital signs in last 24 hours: @VSRANGES @  Labs:   There is no height or weight on file to calculate BMI.   Imaging Review Plain radiographs demonstrate severe degenerative joint disease of the right hip(s). The bone quality appears to be fair for age and reported activity level.  Assessment/Plan:  End stage arthritis, right hip(s)  The patient history, physical examination, clinical judgement of the provider and imaging studies are consistent with end stage degenerative joint disease of the right hip(s) and total hip arthroplasty is deemed medically necessary. The treatment options including medical management, injection therapy, arthroscopy and arthroplasty were discussed at length. The risks and benefits  of total hip arthroplasty were presented and reviewed. The risks due to aseptic loosening, infection, stiffness, dislocation/subluxation,  thromboembolic complications and other imponderables were discussed.  The  patient acknowledged the explanation, agreed to proceed with the plan and consent was signed. Patient is being admitted for inpatient treatment for surgery, pain control, PT, OT, prophylactic antibiotics, VTE prophylaxis, progressive ambulation and ADL's and discharge planning.The patient is planning to be discharged home with home health services

## 2014-03-24 NOTE — Progress Notes (Signed)
Utilization review completed.  

## 2014-03-24 NOTE — Discharge Summary (Signed)
Patient ID: Erik Odonnell MRN: [id number] DOB/AGE: Aug 23, 1946 68 y.o.  Admit date: 03/24/2014 Discharge date: 03/24/2014  Admission Diagnoses:  Active Problems:   Primary localized osteoarthrosis, pelvic region and thigh   Discharge Diagnoses:  Same  Past Medical History  Diagnosis Date  . Hemifacial spasm     takes Artane daily  . Hypertension     takes Ziac daily  . History of bronchitis 11/2013  . Arthritis   . Joint pain   . Diverticulosis     Surgeries: Procedure(s): TOTAL HIP ARTHROPLASTY ANTERIOR APPROACH on 03/24/2014   Consultants:    Discharged Condition: Improved  Hospital Course: Erik Odonnell is an 68 y.o. male who was admitted 03/24/2014 for operative treatment of<principal problem not specified>. Patient has severe unremitting pain that affects sleep, daily activities, and work/hobbies. After pre-op clearance the patient was taken to the operating room on 03/24/2014 and underwent  Procedure(s): TOTAL HIP ARTHROPLASTY ANTERIOR APPROACH.    Patient was given perioperative antibiotics: Anti-infectives   Start     Dose/Rate Route Frequency Ordered Stop   03/24/14 0600  ceFAZolin (ANCEF) IVPB 2 g/50 mL premix     2 g 100 mL/hr over 30 Minutes Intravenous On call to O.R. 03/23/14 1408 03/24/14 1000       Patient was given sequential compression devices, early ambulation, and chemoprophylaxis to prevent DVT.  Patient benefited maximally from hospital stay and there were no complications.    Recent vital signs: Patient Vitals for the past 24 hrs:  BP Temp Temp src Pulse Resp SpO2 Weight  03/24/14 1400 118/78 mmHg 97.5 F (36.4 C) - 62 14 96 % -  03/24/14 1350 - - - 56 12 97 % -  03/24/14 1345 111/73 mmHg - - 58 16 97 % -  03/24/14 1330 133/80 mmHg - - 57 12 96 % -  03/24/14 1315 113/74 mmHg - - 59 12 96 % -  03/24/14 1300 117/78 mmHg - - 64 14 96 % -  03/24/14 1245 110/72 mmHg - - 62 13 95 % -  03/24/14 1239 117/70 mmHg 97.4 F (36.3 C) - 65 12 95 % -   03/24/14 0736 135/91 mmHg 98.1 F (36.7 C) Oral 56 20 97 % 88.905 kg (196 lb)     Recent laboratory studies: No results found for this basename: WBC, HGB, HCT, PLT, NA, K, CL, CO2, BUN, CREATININE, GLUCOSE, PT, INR, CALCIUM, 2,  in the last 72 hours   Discharge Medications:     Medication List    ASK your doctor about these medications       allopurinol 300 MG tablet  Commonly known as:  ZYLOPRIM  Take 300 mg by mouth daily.     aspirin EC 81 MG tablet  Take 81 mg by mouth daily.     bisoprolol-hydrochlorothiazide 2.5-6.25 MG per tablet  Commonly known as:  ZIAC  Take 1 tablet by mouth daily.     trihexyphenidyl 2 MG tablet  Commonly known as:  ARTANE  Take 2 mg by mouth daily.        Diagnostic Studies: Dg Pelvis Portable  03/24/2014   CLINICAL DATA:  Postop right hip  EXAM: PORTABLE PELVIS 1-2 VIEWS  COMPARISON:  None.  FINDINGS: Right total hip arthroplasty in satisfactory position.  Mild narrowing of the left hip joint space.  Visualized bony pelvis appears intact.  Mild degenerative changes of the lower lumbar spine.  IMPRESSION: Right total hip arthroplasty in satisfactory position.  Electronically Signed   By: Julian Hy M.D.   On: 03/24/2014 13:19   Dg Hip Portable 1 View Right  03/24/2014   CLINICAL DATA:  Status post right hip arthroplasty.  EXAM: PORTABLE RIGHT HIP - 1 VIEW  COMPARISON:  None.  FINDINGS: Cross-table lateral view shows right hip prosthesis in expected position. No fracture identified although radiographic detailed is limited by patient habitus/under exposure.  IMPRESSION: Technically suboptimal exam. Right hip prosthesis demonstrated, without definite complication.   Electronically Signed   By: Earle Gell M.D.   On: 03/24/2014 13:29    Disposition: Final discharge disposition not confirmed        Follow-up Information   Follow up with [facility], MD. Schedule an appointment as soon as possible for a visit in 2 weeks.   Specialty:   Orthopedic Surgery   Contact information:   [place] [address] [phone]        Signed: Jerilynn Mages. Doran Stabler 03/24/2014, 2:17 PM

## 2014-03-24 NOTE — Anesthesia Preprocedure Evaluation (Signed)
Anesthesia Evaluation  Patient identified by MRN, date of birth, ID band Patient awake    Reviewed: Allergy & Precautions, H&P , NPO status , Patient's Chart, lab work & pertinent test results  History of Anesthesia Complications Negative for: history of anesthetic complications  Airway Mallampati: II      Dental  (+) Teeth Intact   Pulmonary neg pulmonary ROS,  breath sounds clear to auscultation        Cardiovascular hypertension, Rhythm:Regular Rate:Normal     Neuro/Psych    GI/Hepatic negative GI ROS, Neg liver ROS,   Endo/Other  negative endocrine ROS  Renal/GU negative Renal ROS     Musculoskeletal   Abdominal   Peds  Hematology negative hematology ROS (+)   Anesthesia Other Findings   Reproductive/Obstetrics                           Anesthesia Physical Anesthesia Plan  ASA: II  Anesthesia Plan: General   Post-op Pain Management:    Induction: Intravenous  Airway Management Planned: Oral ETT  Additional Equipment:   Intra-op Plan:   Post-operative Plan: Extubation in OR  Informed Consent: I have reviewed the patients History and Physical, chart, labs and discussed the procedure including the risks, benefits and alternatives for the proposed anesthesia with the patient or authorized representative who has indicated his/her understanding and acceptance.   Dental advisory given  Plan Discussed with: CRNA and Surgeon  Anesthesia Plan Comments:         Anesthesia Quick Evaluation

## 2014-03-24 NOTE — Transfer of Care (Signed)
Immediate Anesthesia Transfer of Care Note  Patient: Erik Odonnell  Procedure(s) Performed: Procedure(s): TOTAL HIP ARTHROPLASTY ANTERIOR APPROACH (Right)  Patient Location: PACU  Anesthesia Type:General  Level of Consciousness: patient cooperative and responds to stimulation  Airway & Oxygen Therapy: Patient Spontanous Breathing and Patient connected to nasal cannula oxygen  Post-op Assessment: Report given to PACU RN, Post -op Vital signs reviewed and stable and Patient moving all extremities X 4  Post vital signs: Reviewed and stable  Complications: No apparent anesthesia complications

## 2014-03-25 LAB — BASIC METABOLIC PANEL
BUN: 18 mg/dL (ref 6–23)
CHLORIDE: 95 meq/L — AB (ref 96–112)
CO2: 23 meq/L (ref 19–32)
Calcium: 9 mg/dL (ref 8.4–10.5)
Creatinine, Ser: 1 mg/dL (ref 0.50–1.35)
GFR calc Af Amer: 87 mL/min — ABNORMAL LOW (ref >90)
GFR calc non Af Amer: 75 mL/min — ABNORMAL LOW (ref >90)
Glucose, Bld: 140 mg/dL — ABNORMAL HIGH (ref 70–99)
POTASSIUM: 4.3 meq/L (ref 3.7–5.3)
Sodium: 133 mEq/L — ABNORMAL LOW (ref 137–147)

## 2014-03-25 LAB — CBC
HCT: 36.9 % — ABNORMAL LOW (ref 39.0–52.0)
Hemoglobin: 13 g/dL (ref 13.0–17.0)
MCH: 34.4 pg — ABNORMAL HIGH (ref 26.0–34.0)
MCHC: 35.2 g/dL (ref 30.0–36.0)
MCV: 97.6 fL (ref 78.0–100.0)
Platelets: 204 10*3/uL (ref 150–400)
RBC: 3.78 MIL/uL — ABNORMAL LOW (ref 4.22–5.81)
RDW: 13.7 % (ref 11.5–15.5)
WBC: 12.5 10*3/uL — ABNORMAL HIGH (ref 4.0–10.5)

## 2014-03-25 NOTE — Progress Notes (Signed)
Agree with SPT.    Nik Gorrell, PT [phone]  

## 2014-03-25 NOTE — Progress Notes (Signed)
Subjective: 1 Day Post-Op Procedure(s) (LRB): TOTAL HIP ARTHROPLASTY ANTERIOR APPROACH (Right) Patient reports pain as 2 on 0-10 scale.  Pain well controlled.  No nausea/vomiting, lightheadedness/dizziness.  No flatus and no bm as of yet.    Objective: Vital signs in last 24 hours: Temp:  [97.4 F (36.3 C)-98.1 F (36.7 C)] 97.5 F (36.4 C) (06/04 0537) Pulse Rate:  [56-86] 78 (06/04 0537) Resp:  [12-20] 16 (06/04 0537) BP: (110-135)/(70-91) 122/75 mmHg (06/04 0537) SpO2:  [95 %-100 %] 97 % (06/04 0537) Weight:  [88.905 kg (196 lb)] 88.905 kg (196 lb) (06/03 1443)  Intake/Output from previous day: 06/03 0701 - 06/04 0700 In: 2650 [P.O.:240; I.V.:2110; IV Piggyback:300] Out: 1550 [Urine:900; Blood:650] Intake/Output this shift: Total I/O In: 240 [P.O.:240] Out: 900 [Urine:900]   Recent Labs  03/25/14 0538  HGB 13.0    Recent Labs  03/25/14 0538  WBC 12.5*  RBC 3.78*  HCT 36.9*  PLT 204   No results found for this basename: NA, K, CL, CO2, BUN, CREATININE, GLUCOSE, CALCIUM,  in the last 72 hours No results found for this basename: LABPT, INR,  in the last 72 hours  Neurologically intact Neurovascular intact Sensation intact distally Intact pulses distally Dorsiflexion/Plantar flexion intact Compartment soft  Assessment/Plan: 1 Day Post-Op Procedure(s) (LRB): TOTAL HIP ARTHROPLASTY ANTERIOR APPROACH (Right) Advance diet Up with therapy D/C IV fluids Discharge home with home health most likely today wbat RLE Anterior total hip replacement precautions  M. Doran Stabler 03/25/2014, 6:40 AM

## 2014-03-25 NOTE — Op Note (Signed)
Erik Odonnell, Erik Odonnell NO.:  [id number]  MEDICAL RECORD NO.:  [id number]  LOCATION:  5N24C                        FACILITY:  [facility]  PHYSICIAN:  Ninetta Lights, M.D. DATE OF BIRTH:  [birth year]  DATE OF PROCEDURE:  03/24/2014 DATE OF DISCHARGE:                              OPERATIVE REPORT   PREOPERATIVE DIAGNOSIS:  Right hip end-stage degenerative arthritis.  POSTOPERATIVE DIAGNOSIS:  Right hip end-stage degenerative arthritis.  PROCEDURE:  Direct anterior right total hip replacement utilizing Stryker prosthesis.  Press-fit 52 mm acetabular component screw fixation x2.  A 36-mm internal diameter liner.  A press-fit #5 Accolade stem. 127-degree neck angle.  A 36 -2.5 Biolox head.  SURGEON:  Ninetta Lights, M.D.  ASSISTANT:  Doran Stabler P.A., present throughout the entire case, necessary for timely completion of procedure.  ANESTHESIA:  General.  ESTIMATED BLOOD LOSS:  300 mL.  BLOOD GIVEN:  None.  SPECIMENS:  None.  CULTURES:  None.  COMPLICATIONS:  None.  DRESSINGS:  Soft compressive.  DESCRIPTION OF PROCEDURE:  The patient was brought to the operating room, placed on the operating table in supine position.  After adequate anesthesia had been obtained, appropriate positioning, prepped and draped for an anterior approach total hip replacement.  Longitudinal incision just posterior to the anterior superior iliac spine.  Extending distal and slightly posterior.  Skin and subcutaneous tissues divided. Fascia over the tensor incised.  Retracted to allow access.  The deep fascia and capsule were then completely excised from the front of the hip.  Hemostasis with cautery.  Neurovascular structures protected.  The femoral head was cut, applied, and then a napkin ring below this.  The most distal cut 1 fingerbreadth above the lesser trochanter.  Bone fragments removed.  Acetabulum exposed.  Brought up to good bleeding bone with reamers with  sufficient inferior medial placement.  Fitted with a 52-mm cup and 40 degrees of abduction.  Hammered in place.  Good capturing and fixation augmented with 2 screws through the cup.  A 36-mm internal diameter liner.  The femur was then freed up and delivered. Appropriate broaches up to a #5 which gave good fitting and filling. After appropriate trials, #5 stem was seated.  A 36 -2.5 Biolox head. Hip reduced.  Grade stability equal leg lengths.  Wound irrigated.  Soft tissues injected with Exparel.  The fascia over the tensor was closed with Vicryl and then subcutaneous subcuticular closure.  Margins were injected with Marcaine. Sterile compressive dressing applied.  Anesthesia reversed.  Brought to the recovery room.  Tolerated the surgery well.  No complications.     Ninetta Lights, M.D.     DFM/MEDQ  D:  03/24/2014  T:  03/25/2014  Job:  [id number]

## 2014-03-25 NOTE — Care Management Note (Signed)
CARE MANAGEMENT NOTE 03/25/2014  Patient:  CHIVAS, NOTZ   Account Number:  [id number]  Date Initiated:  03/25/2014  Documentation initiated by:  Ricki Miller  Subjective/Objective Assessment:   68 yr old male s/p right total hip arthroplasty.     Action/Plan:   Case manager spoke with patient and wife concerning home health and DME needs at discharge. Choice offered. Referral called to [facility], [facility] called with referral. Patient requests Tressia Danas if he is available in [place], [place].   Anticipated DC Date:  03/26/2014   Anticipated DC Plan:  [place]  CM consult      PAC Choice  [place]   Choice offered to / List presented to:  C-1 Patient   DME arranged  3-N-1  [facility]      DME agency  [employer]     HH arranged  HH-2 PT      [facility].   Status of service:  Completed, signed off Medicare Important Message given?  NA - LOS <3 / Initial given by admissions (If response is "NO", the following Medicare IM given date fields will be blank) Date Medicare IM given:   Date Additional Medicare IM given:    Discharge Disposition:  [place]  Per UR Regulation:  Reviewed for med. necessity/level of care/duration of stay

## 2014-03-25 NOTE — Progress Notes (Signed)
Oxycodone 5 mg returned after patient changed mind - wanted just 1 tablet.

## 2014-03-25 NOTE — Progress Notes (Signed)
Physical Therapy Treatment Patient Details Name: Erik Odonnell MRN: [id number] DOB: 07-14-1946 Today's Date: 03/25/2014    History of Present Illness 68 y.o. Male s/p direct anterior R THA on 03/24/14    PT Comments    Pt ambulated 500 ft with RW and supervision for safety.  He completed 5 steps with L rail and cane with VC for strategy and sequencing, then completed 2 more sets of 5 stairs with no VC and supervision for balance and safety.  Pt plans to d/c to home today.  Will continue to follow until pt is discharged from acute inpatient care.   Follow Up Recommendations  Home health PT;Supervision for mobility/OOB     Equipment Recommendations  Rolling walker with 5" wheels    Recommendations for Other Services       Precautions / Restrictions Precautions Precautions: Anterior Hip;Fall Precaution Booklet Issued: Yes (comment) Precaution Comments: Discussed precautions with pt. Restrictions Weight Bearing Restrictions: Yes RLE Weight Bearing: Weight bearing as tolerated    Mobility  Bed Mobility               General bed mobility comments: Pt up in chair upon PT arrival.  Transfers Overall transfer level: Needs assistance Equipment used: Rolling walker (2 wheeled) Transfers: Sit to/from Stand Sit to Stand: Supervision         General transfer comment: Supervision to ensure proper technique with RW.  Ambulation/Gait Ambulation/Gait assistance: Supervision Ambulation Distance (Feet): 500 Feet Assistive device: Rolling walker (2 wheeled) Gait Pattern/deviations: Step-to pattern;Decreased stance time - right;Decreased step length - left Gait velocity: decreased Gait velocity interpretation: Below normal speed for age/gender General Gait Details: Supervision for safety with balance.  No VC or physical assistance necessary.   Stairs Stairs: Yes Stairs assistance: Supervision Stair Management: One rail Left;With cane;Step to pattern Number of Stairs:  15 General stair comments: Pt demonstrated good understanding of stair strategy and demonstrated 2 sets of 5 steps without VC after completing first set of 5 with instructions.  Wheelchair Mobility    Modified Rankin (Stroke Patients Only)       Balance Overall balance assessment: Needs assistance Sitting-balance support: No upper extremity supported;Feet supported Sitting balance-Leahy Scale: Fair     Standing balance support: No upper extremity supported;During functional activity Standing balance-Leahy Scale: Fair Standing balance comment: Pt able to take steps to turn from walker in standing with no UE support.                    Cognition Arousal/Alertness: Awake/alert Behavior During Therapy: WFL for tasks assessed/performed Overall Cognitive Status: Within Functional Limits for tasks assessed                      Exercises      General Comments        Pertinent Vitals/Pain Pt reports that he is not having much pain at this time.    Home Living Family/patient expects to be discharged to:: Private residence Living Arrangements: Spouse/significant other Available Help at Discharge: Family;Friend(s);Available 24 hours/day Type of Home: House Home Access: Stairs to enter Entrance Stairs-Rails: Left Home Layout: One level Home Equipment: Walker - standard;Cane - single point;Shower seat;Toilet riser;Adaptive equipment      Prior Function Level of Independence: Independent          PT Goals (current goals can now be found in the care plan section) Acute Rehab PT Goals Patient Stated Goal: Go to [place]. [place] with wife next May.  PT Goal Formulation: With patient Time For Goal Achievement: 04/05/14 Potential to Achieve Goals: Good Progress towards PT goals: Progressing toward goals    Frequency  7X/week    PT Plan Current plan remains appropriate    Co-evaluation             End of Session Equipment Utilized During Treatment: Gait  belt Activity Tolerance: Patient tolerated treatment well Patient left: in chair;with call bell/phone within reach;with family/visitor present     Time: 1349-1415 PT Time Calculation (min): 26 min  Charges:  $Gait Training: 23-37 mins                    G Codes:      Erik Odonnell, SPT 03/25/2014, 3:09 PM

## 2014-03-25 NOTE — Evaluation (Signed)
Agree with SPT.    Rylyn Ranganathan, PT [phone]  

## 2014-03-25 NOTE — Evaluation (Signed)
Agree with note. 03/25/2014 Nestor Lewandowsky, OTR/L Pager: [phone]

## 2014-03-25 NOTE — Evaluation (Signed)
Physical Therapy Evaluation Patient Details Name: Erik Odonnell MRN: [id number] DOB: [birth year] Today's Date: 03/25/2014   History of Present Illness  68 y.o. Male s/p direct anterior R THA on 03/24/14  Clinical Impression  Pt performed bed mobility and sit to/from stand transfers with no physical assistance, VC for hand placement and strategy.  Pt ambulated 200 ft with RW and min-guard with VC for sequencing and safe use of RW.  Pt plans to D/C to home with wife, 4 STE with left hand rail.  Pt will need RW for home use.  He will benefit from continued PT services to improve mobility and increased strength and ROM.  Will continue to follow.    Follow Up Recommendations Home health PT;Supervision for mobility/OOB    Equipment Recommendations  Rolling walker with 5" wheels    Recommendations for Other Services       Precautions / Restrictions Precautions Precautions: Anterior Hip;Fall Precaution Booklet Issued: Yes (comment) Precaution Comments: Discussed precautions with pt. Restrictions Weight Bearing Restrictions: Yes RLE Weight Bearing: Weight bearing as tolerated      Mobility  Bed Mobility Overal bed mobility: Needs Assistance Bed Mobility: Supine to Sit     Supine to sit: Min guard     General bed mobility comments: Pt able to perform supine to sit with no physical assistance, VC for strategy and min-guard for safety due to decreased speed of movement.  Transfers Overall transfer level: Needs assistance Equipment used: Rolling walker (2 wheeled) Transfers: Sit to/from Stand Sit to Stand: Min guard         General transfer comment: VC provided for hand placement and to lean forward before standing.  Min-guard for safety.  Ambulation/Gait Ambulation/Gait assistance: Min guard Ambulation Distance (Feet): 200 Feet Assistive device: Rolling walker (2 wheeled) Gait Pattern/deviations: Step-to pattern;Decreased stance time - right;Decreased step length -  left;Decreased weight shift to right Gait velocity: decreased Gait velocity interpretation: Below normal speed for age/gender General Gait Details: VC for sequencing and safe use of RW.  Pt ambulates very slowly to concentrate on advancing walker before stepping forward.  Pt tends to push walker too far out in front of him, which he corrected with VC.  Pt's O2 sats at 93 in sitting with 2L O2 upon PT arrival, this went up to 95 when sitting EOB having conversation.  O2 was removed for ambulation, upon return to room, pt's O2 was 88-89.  Pt put back on 2L O2 in sitting, in 1 minute, his O2 sat was back to 92.  Stairs            Wheelchair Mobility    Modified Rankin (Stroke Patients Only)       Balance Overall balance assessment: Needs assistance Sitting-balance support: No upper extremity supported;Feet supported Sitting balance-Leahy Scale: Fair Sitting balance - Comments: Pt able to sit at EOB with no UE support but not able to accept more than minimal challenge.   Standing balance support: During functional activity;Bilateral upper extremity supported Standing balance-Leahy Scale: Poor Standing balance comment: Pt maintained bil. UE support on RW throughout stance activities.                             Pertinent Vitals/Pain Pt reports 3/10 pain with activity.  Pt premedicated.    Home Living Family/patient expects to be discharged to:: Private residence Living Arrangements: Spouse/significant other Available Help at Discharge: Family;Friend(s);Available 24 hours/day Type of Home: [place]  Access: Stairs to enter Entrance Stairs-Rails: Left Entrance Stairs-Number of Steps: 4 Home Layout: One level Home Equipment: Walker - standard;Cane - single point;Shower seat;Toilet riser;Adaptive equipment      Prior Function Level of Independence: Independent               Hand Dominance        Extremity/Trunk Assessment   Upper Extremity Assessment:  Overall WFL for tasks assessed           Lower Extremity Assessment: RLE deficits/detail RLE Deficits / Details: decreased ROM and strength, increased pain       Communication   Communication: No difficulties  Cognition Arousal/Alertness: Awake/alert Behavior During Therapy: WFL for tasks assessed/performed Overall Cognitive Status: Within Functional Limits for tasks assessed                      General Comments      Exercises Total Joint Exercises Ankle Circles/Pumps: AROM;Both;20 reps;Seated Quad Sets: AROM;Right;5 reps;Seated Long Arc Quad: AROM;15 reps;Seated Knee Flexion: AROM;Right;10 reps;Seated      Assessment/Plan    PT Assessment Patient needs continued PT services  PT Diagnosis Abnormality of gait;Acute pain   PT Problem List Decreased strength;Decreased range of motion;Decreased activity tolerance;Decreased balance;Decreased mobility;Decreased knowledge of use of DME;Decreased safety awareness;Decreased knowledge of precautions;Pain  PT Treatment Interventions DME instruction;Stair training;Gait training;Functional mobility training;Therapeutic activities;Therapeutic exercise;Balance training;Patient/family education   PT Goals (Current goals can be found in the Care Plan section) Acute Rehab PT Goals Patient Stated Goal: Go to [place]. [place] with wife next May. PT Goal Formulation: With patient Time For Goal Achievement: 04/05/14 Potential to Achieve Goals: Good    Frequency 7X/week   Barriers to discharge        Co-evaluation               End of Session Equipment Utilized During Treatment: Gait belt Activity Tolerance: Patient tolerated treatment well Patient left: in chair;with call bell/phone within reach Nurse Communication: Mobility status         Time: 7035-0093 PT Time Calculation (min): 47 min   Charges:         PT G Codes:          Lateka Rady, SPT 03/25/2014, 9:35 AM

## 2014-03-25 NOTE — Evaluation (Signed)
Occupational Therapy Evaluation and Discharge Patient Details Name: Erik Odonnell MRN: [id number] DOB: 1946-01-02 Today's Date: 03/25/2014    History of Present Illness 68 y.o. Male s/p direct anterior R THA on 03/24/14   Clinical Impression   Prior to admission for above, pt was independent with ADLs and is now at a modified independent level. Pt ambulated to/from the toilet using his RW without physical assistance. Pt was educated on and practiced using AE to assist with LB dressing and bathing. Pt has good support and assistance as needed from his wife at home and therefore no further OT is needed.   Follow Up Recommendations  No OT follow up          Precautions / Restrictions Precautions Precautions: Anterior Hip;Fall Precaution Comments: Discussed precautions with pt. Restrictions Weight Bearing Restrictions: Yes RLE Weight Bearing: Weight bearing as tolerated      Mobility Bed Mobility  Transfers Overall transfer level: Needs assistance Equipment used: Rolling walker (2 wheeled) Transfers: Sit to/from Stand Sit to Stand: Modified independent (Device/Increase time)         General transfer comment: VC provided for technique while sitting.    Balance Overall balance assessment: Needs assistance Sitting-balance support: No upper extremity supported;Feet supported Sitting balance-Leahy Scale: Fair    Standing balance support: During functional activity;Bilateral upper extremity supported Standing balance-Leahy Scale: Fair Standing balance comment: Pt able to lift up RW while standing putting all his weight onto LEs                            ADL Overall ADL's : Needs assistance/impaired Eating/Feeding: Independent;Sitting   Grooming: Modified independent;Standing   Upper Body Bathing: Modified independent;Sitting;With adaptive equipment   Lower Body Bathing: Modified independent;Sit to/from stand;With adaptive equipment   Upper Body Dressing :  Sitting;Modified independent   Lower Body Dressing: With adaptive equipment;Modified independent;Sit to/from stand   Toilet Transfer: Modified Independent;Ambulation;RW   Toileting- Clothing Manipulation and Hygiene: Modified independent;Sit to/from stand       Functional mobility during ADLs: Modified independent;Rolling walker General ADL Comments: Pt ambulated to/from toilet using RW without physical assistance. Pt educated on and practiced using AE to assist with LB bathing and dressing. Pt requires additional time for ADLs due to WBAT and ROM limitations.               Pertinent Vitals/Pain No c/o pain.     Hand Dominance  Right   Extremity/Trunk Assessment Upper Extremity Assessment Upper Extremity Assessment: Overall WFL for tasks assessed       Communication Communication Communication: No difficulties   Cognition Arousal/Alertness: Awake/alert Behavior During Therapy: WFL for tasks assessed/performed Overall Cognitive Status: Within Functional Limits for tasks assessed                                Home Living Family/patient expects to be discharged to:: Private residence Living Arrangements: Spouse/significant other Available Help at Discharge: Family;Friend(s);Available 24 hours/day Type of Home: House Home Access: Stairs to enter [employer] of Steps: 4 Entrance Stairs-Rails: Left Home Layout: One level     Bathroom Shower/Tub: Teacher, early years/pre: Standard     Home Equipment: Walker - standard;Cane - single point;Shower seat;Toilet riser;Adaptive equipment Adaptive Equipment: Reacher;Sock aid;Long-handled shoe horn        Prior Functioning/Environment Level of Independence: Independent  OT Goals(Current goals can be found in the care plan section) Acute Rehab OT Goals Patient Stated Goal: Go to [place]. [place] with wife next May. OT Goal Formulation: With patient Time For Goal  Achievement: 04/01/14 Potential to Achieve Goals: Good                End of Session Equipment Utilized During Treatment: Rolling walker  Activity Tolerance: Patient tolerated treatment well Patient left: in chair;with call bell/phone within reach;with family/visitor present   Time:  -    Charges:    G-Codes:    Lyda Perone 2014/04/07, 2:00 PM

## 2014-03-26 ENCOUNTER — Encounter (HOSPITAL_COMMUNITY): Payer: Self-pay | Admitting: Orthopedic Surgery

## 2014-04-07 ENCOUNTER — Ambulatory Visit (HOSPITAL_COMMUNITY)
Admission: RE | Admit: 2014-04-07 | Discharge: 2014-04-07 | Disposition: A | Discharge status: Home / Self Care | Payer: Medicare Other | Source: Ambulatory Visit | Attending: Orthopedic Surgery | Admitting: Orthopedic Surgery

## 2014-04-07 DIAGNOSIS — R262 Difficulty in walking, not elsewhere classified: Secondary | ICD-10-CM

## 2014-04-07 DIAGNOSIS — IMO0001 Reserved for inherently not codable concepts without codable children: Secondary | ICD-10-CM | POA: Insufficient documentation

## 2014-04-07 DIAGNOSIS — R2689 Other abnormalities of gait and mobility: Secondary | ICD-10-CM

## 2014-04-07 DIAGNOSIS — R29898 Other symptoms and signs involving the musculoskeletal system: Secondary | ICD-10-CM

## 2014-04-07 DIAGNOSIS — I10 Essential (primary) hypertension: Secondary | ICD-10-CM | POA: Insufficient documentation

## 2014-04-07 DIAGNOSIS — M25559 Pain in unspecified hip: Secondary | ICD-10-CM | POA: Insufficient documentation

## 2014-04-07 HISTORY — DX: Difficulty in walking, not elsewhere classified: R26.2

## 2014-04-07 HISTORY — DX: Other symptoms and signs involving the musculoskeletal system: R29.898

## 2014-04-07 HISTORY — DX: Other abnormalities of gait and mobility: R26.89

## 2014-04-07 NOTE — Evaluation (Signed)
Physical Therapy Evaluation  Patient Details  Name: Erik Odonnell MRN: [id number] Date of Birth: [birth year]  Today's Date: 04/07/2014 Time: 1015-1100 PT Time Calculation (min): 45 min Charge:   Evaluation 1015-1050: there ex 1050-1100             Visit#: 1 of 8  Re-eval: 05/07/14 Assessment Diagnosis: Rt TKR  Surgical Date: 03/24/14 Next MD Visit: 05/07/2014 Prior Therapy: HH  Authorization: [employer] medicare    Past Medical History:  Past Medical History  Diagnosis Date  . Hemifacial spasm     takes Artane daily  . Hypertension     takes Ziac daily  . History of bronchitis 11/2013  . Arthritis   . Joint pain   . Diverticulosis    Past Surgical History:  Past Surgical History  Procedure Laterality Date  . Hernia repair Left     as a child-inguinal;right inguinal in 1969  . Eye muschle surgeries  as a child    x 5  . Colonoscopy    . Total hip arthroplasty Right 03/24/2014    Procedure: TOTAL HIP ARTHROPLASTY ANTERIOR APPROACH;  Surgeon: Erik Lights, MD;  Location: [place];  Service: Orthopedics;  Laterality: Right;    Subjective Symptoms/Limitations Symptoms: Erik Odonnell had a THR on his Rt hip on 03/24/2014 and was discharged to home on 03/26/2014.  He did well with home health and is now being referred to therapy to improve his functional ability.  Erik Odonnell states that standing prolong periods and squatting is the most difficult things for him to complete. How long can you sit comfortably?: able to sit as long as he wanted  How long can you stand comfortably?: able to stand for several minutes  How long can you walk comfortably?: Pt  is walking with a cane for about ten minutes  Patient Stated Goals: walk without a cane, able to go up steps reciprically; able to go up 29 steps to get [place] to [place].  Pt to be able to come up from squat positon with ease; to be painfree; to be able to walk 4 miles  Pain Assessment Currently in Pain?: No/denies (highest pain has been a  5/10 in the past 24 hr. )  Precautions/Restrictions   none  Balance Screening  no falls  Prior Functioning   I;  Pt enjoys chopping wood, walking and going to the beach     Sensation/Coordination/Flexibility/Functional Tests Functional Tests Functional Tests: foto 76  Assessment RLE Strength Right Hip Flexion: 5/5 Right Hip Extension:  (4-/5) Right Hip ABduction:  (4-/5) Right Hip ADduction: 5/5 Right Knee Extension: 5/5 Right Ankle Dorsiflexion: 5/5  Exercise/Treatments Mobility/Balance  Static Standing Balance Single Leg Stance - Right Leg: 3 Single Leg Stance - Left Leg: 19     Standing Heel Raises: 10 reps Functional Squat: 10 reps SLS: 3 x 10"   Supine Bridges: 10 reps Sidelying Hip ABduction: 10 reps Prone  Hip Extension: 10 reps     Physical Therapy Assessment and Plan PT Assessment and Plan Clinical Impression Statement: PT is a 68 yo male who underwent and anterior approach THR on his Rt side.  The patient has been referred to outpatient physical therapy.  Examination demonstrates decreased strength, decreased balance and decreased power.  Erik Odonnell will benefit from skilled PT to improve these deficiets and maximize his functioning abiliyt.  Pt will benefit from skilled therapeutic intervention in order to improve on the following deficits: Decreased activity tolerance;Decreased balance;Pain;Decreased strength Rehab  Potential: Good PT Frequency: Min 2X/week PT Duration: 4 weeks PT Plan: Pt to begin Rockerboard; step up, lateral step up , step down, lunging and balance actitivies.     Goals Home Exercise Program Pt/caregiver will Perform Home Exercise Program: For increased strengthening PT Goal: Perform Home Exercise Program - Progress: Goal set today PT Short Term Goals Time to Complete Short Term Goals: 2 weeks PT Short Term Goal 1: Pt to be ambulating without a cane inside PT Short Term Goal 2: Pt pain to be no greater than a 3/10 PT Short  Term Goal 3: Pt to be able to go up a filight of steps in a reciprocal manner with hand hold PT Long Term Goals Time to Complete Long Term Goals: 4 weeks PT Long Term Goal 1: Pt to be I in advance HEP PT Long Term Goal 2: Pt to be able to ambulate with confidence outside without a cane Long Term Goal 3: Pt to be able to go upu 29 steps in a reciprocal manner to get into his beach house.   Long Term Goal 4: no pain  Problem List Patient Active Problem List   Diagnosis Date Noted  . Proximal leg weakness 04/07/2014  . Difficulty in walking(719.7) 04/07/2014  . Impairment of balance 04/07/2014  . Primary localized osteoarthrosis, pelvic region and thigh 03/24/2014    PT Plan of Care PT Home Exercise Plan: given  GP Functional Assessment Tool Used: foto Functional Limitation: Mobility: Walking and moving around Mobility: Walking and Moving Around Current Status (E8315): At least 20 percent but less than 40 percent impaired, limited or restricted Mobility: Walking and Moving Around Goal Status [phone]): At least 1 percent but less than 20 percent impaired, limited or restricted  Erik Odonnell 04/07/2014, 11:15 AM  Physician Documentation Your signature is required to indicate approval of the treatment plan as stated above.  Please sign and either send electronically or make a copy of this report for your files and return this physician signed original.   Please mark one 1.__approve of plan  2. ___approve of plan with the following conditions.   ______________________________                                                          _____________________ Physician Signature                                                                                                             Date

## 2014-04-09 ENCOUNTER — Ambulatory Visit (HOSPITAL_COMMUNITY)
Admission: RE | Admit: 2014-04-09 | Discharge: 2014-04-09 | Disposition: A | Discharge status: Home / Self Care | Payer: Medicare Other | Source: Ambulatory Visit | Attending: Orthopedic Surgery | Admitting: Orthopedic Surgery

## 2014-04-09 DIAGNOSIS — R262 Difficulty in walking, not elsewhere classified: Secondary | ICD-10-CM

## 2014-04-09 DIAGNOSIS — R2689 Other abnormalities of gait and mobility: Secondary | ICD-10-CM

## 2014-04-09 DIAGNOSIS — R29898 Other symptoms and signs involving the musculoskeletal system: Secondary | ICD-10-CM

## 2014-04-09 NOTE — Progress Notes (Signed)
Physical Therapy Treatment Patient Details  Name: Erik Odonnell MRN: [id number] Date of Birth: Mar 02, 1946  Today's Date: 04/09/2014 Time: 1140-1227 PT Time Calculation (min): 47 min There ex 8003-4917 Visit#: 2 of 8  Re-eval: 05/07/14    Authorization: [employer] medicare    Subjective: Symptoms/Limitations Symptoms: Pt states he was sore after last treatmentl Pain Assessment Pain Score: 1   Exercise/Treatments  Stretches Passive Hamstring Stretch: 60 seconds;Limitations Passive Hamstring Stretch Limitations: on14" box Gastroc Stretch: 30 seconds;3 reps;Limitations Gastroc Stretch Limitations: on slant board Aerobic Stationary Bike: nustep hills L4 x 8'   Standing Heel Raises: 10 reps Forward Lunges: 10 reps Lateral Step Up: 10 reps;Step Height: 4" Forward Step Up: 10 reps;Step Height: 4" Functional Squat: 10 reps Rocker Board: 2 minutes SLS: 3x max 12 seconds Other Standing Knee Exercises: marching x 10; sidestep with t-band x 2 RT  Seated Other Seated Knee Exercises: sit to stand x 10      Physical Therapy Assessment and Plan PT Assessment and Plan Clinical Impression Statement: Pt completed strengthening and balance routine with therapist facilitation.  Pt deficits include weak gluteals both maximus and medius as well as balance.   PT Plan: gt train to decrease limp increase stride.    Goals    Problem List Patient Active Problem List   Diagnosis Date Noted  . Proximal leg weakness 04/07/2014  . Difficulty in walking(719.7) 04/07/2014  . Impairment of balance 04/07/2014  . Primary localized osteoarthrosis, pelvic region and thigh 03/24/2014       GP    RUSSELL,CINDY 04/09/2014, 12:25 PM

## 2014-04-13 ENCOUNTER — Ambulatory Visit (HOSPITAL_COMMUNITY)
Admission: RE | Admit: 2014-04-13 | Discharge: 2014-04-13 | Disposition: A | Discharge status: Home / Self Care | Payer: Medicare Other | Source: Ambulatory Visit | Attending: Orthopedic Surgery | Admitting: Orthopedic Surgery

## 2014-04-13 NOTE — Progress Notes (Signed)
Physical Therapy Treatment Patient Details  Name: Erik Odonnell MRN: [id number] Date of Birth: 06/02/1946  Today's Date: 04/13/2014 Time: 1110-1206 PT Time Calculation (min): 56 min TE   Visit#: 3 of 8  Re-eval: 05/07/14    Authorization: [employer] medicare  Authorization Time Period:    Authorization Visit#:   of     Subjective: Symptoms/Limitations Symptoms: Pt states no pain just sore. Pain Assessment Pain Score: 0-No pain  Precautions/Restrictions  Anterior Hip    Exercise/Treatments        Stretches Passive Hamstring Stretch: 60 seconds;Limitations Gastroc Stretch: 30 seconds;3 reps;Limitations Gastroc Stretch Limitations: on slant board Aerobic Stationary Bike: nustep hills L4 x 8' Standing Heel Raises: 15 reps;Limitations Heel Raises Limitations: toe raises Forward Lunges: 10 reps Lateral Step Up: Step Height: 6";Hand Hold: 1;10 reps Forward Step Up: Hand Hold: 1;Step Height: 6";10 reps;Limitations (fingertips) Step Down: Hand Hold: 1;Step Height: 6";10 reps Rocker Board: 2 minutes SLS: best of 3=11" SLS with Vectors: 2" hold with fingertips x3 Other Standing Knee Exercises: marching x 10; sidestep with t-band x 2 NT:[id number] 1 RT  Seated Other Seated Knee Exercises: sit to stand x 10 (last 5 with LLE fwd) Supine Bridges: 10 reps Sidelying Hip ABduction: 10 reps Prone  Hip Extension: 10 reps      Physical Therapy Assessment and Plan PT Assessment and Plan Clinical Impression Statement: Patient continues to recovery well; strength is improving, however balance is poor. Patient is confident and is anxious to return to his very active lifestyle. Warned patient of not overdoing it and to take safety measures as in using cane at all time to decrease risk of fall until balance improves PT Plan: gt train to decrease limp increase stride.: progress balace acitivitie, continue tiwht pT POC    Goals    Problem List Patient Active Problem List   Diagnosis  Date Noted  . Proximal leg weakness 04/07/2014  . Difficulty in walking(719.7) 04/07/2014  . Impairment of balance 04/07/2014  . Primary localized osteoarthrosis, pelvic region and thigh 03/24/2014    PT - End of Session Equipment Utilized During Treatment: Gait belt Activity Tolerance: Patient tolerated treatment well  GP    EAGLETON, [place] 04/13/2014, 3:26 PM

## 2014-04-15 ENCOUNTER — Ambulatory Visit (HOSPITAL_COMMUNITY)
Admission: RE | Admit: 2014-04-15 | Discharge: 2014-04-15 | Disposition: A | Discharge status: Home / Self Care | Payer: Medicare Other | Source: Ambulatory Visit | Attending: Orthopedic Surgery | Admitting: Orthopedic Surgery

## 2014-04-15 NOTE — Progress Notes (Signed)
Physical Therapy Treatment Patient Details  Name: Erik Odonnell MRN: [id number] Date of Birth: [birth year]  Today's Date: 04/15/2014 Time: 1110-1144 PT Time Calculation (min): 34 min  Visit#: 5 of 8  Re-eval: 05/07/14    Authorization: [employer] medicare  Authorization Time Period:    Authorization Visit#:   of     Subjective: Symptoms/Limitations Symptoms: Patient enters gym without cane today, states he is no longer using it. Pain only reported when pt initial walks, 3/10 and then pain subsides and soreness only presents Pain Assessment Pain Score: 0-No pain  Precautions/Restrictions   Anterior HIp  Exercise/Treatments    Stretches Active Hamstring Stretch: 30 seconds;Limitations (14" box) Gastroc Stretch: 30 seconds;3 reps;Limitations Gastroc Stretch Limitations: on slant board Aerobic Stationary Bike: nustep hills L4 x 8'    Standing Heel Raises: 15 reps;Limitations Forward Lunges: 15 reps;Limitations (6"box) Side Lunges: 15 reps        Physical Therapy Assessment and Plan PT Assessment and Plan Clinical Impression Statement: Patient has met short term goals of reciprocal stairs (22) with railing;supervision. Patient again displays "overconfidence" not using any hands to hold, discussed safety with patient and importance of slowing down and using safety measure to decrease fall risks. Patient has also d/c cane use. Patient completed 225' of outdoor gait without AD. He required verbal cues for increased step length of RLE.  PT Plan: gt train to decrease limp increase stride.: progress balace acitivitie, continue with pT POC    Goals    Problem List Patient Active Problem List   Diagnosis Date Noted  . Proximal leg weakness 04/07/2014  . Difficulty in walking(719.7) 04/07/2014  . Impairment of balance 04/07/2014  . Primary localized osteoarthrosis, pelvic region and thigh 03/24/2014    PT - End of Session Activity Tolerance: Patient tolerated treatment  well  GP    EAGLETON, [place] 04/15/2014, 12:02 PM

## 2014-04-19 ENCOUNTER — Ambulatory Visit (HOSPITAL_COMMUNITY)
Admission: RE | Admit: 2014-04-19 | Discharge: 2014-04-19 | Disposition: A | Discharge status: Home / Self Care | Payer: Medicare Other | Source: Ambulatory Visit | Attending: Family Medicine | Admitting: Family Medicine

## 2014-04-19 DIAGNOSIS — R262 Difficulty in walking, not elsewhere classified: Secondary | ICD-10-CM

## 2014-04-19 DIAGNOSIS — R29898 Other symptoms and signs involving the musculoskeletal system: Secondary | ICD-10-CM

## 2014-04-19 DIAGNOSIS — R2689 Other abnormalities of gait and mobility: Secondary | ICD-10-CM

## 2014-04-19 NOTE — Progress Notes (Signed)
Physical Therapy Treatment Patient Details  Name: Erik Odonnell MRN: [id number] Date of Birth: [birth year]  Today's Date: 04/19/2014 Time: 5361-4431 PT Time Calculation (min): 48 min Charge TE 5400-8676  Visit#: 6 of 8  Re-eval: 05/07/14 Assessment Diagnosis: Rt TKR  Surgical Date: 03/24/14 Next MD Visit: 05/07/2014 Prior Therapy: HH  Authorization: [employer] medicare  Authorization Time Period:    Authorization Visit#: 6 of 10   Subjective: Symptoms/Limitations Symptoms: Pt reported working in garden, stated hip was stiff.  Pain free today. Pain Assessment Currently in Pain?: No/denies  Precautions/Restrictions  Precautions Precautions: Anterior Hip  Exercise/Treatments Stretches Active Hamstring Stretch: 3 reps;30 seconds;Limitations Active Hamstring Stretch Limitations: 3 directions on 14in box Hip Flexor Stretch: 2 reps;20 seconds;Limitations Hip Flexor Stretch Limitations: on 14in step Gastroc Stretch: 2 reps;30 seconds;Limitations Gastroc Stretch Limitations: on slant board Aerobic Stationary Bike: nustep hills L4 x10' SPM goal 100' Standing Heel Raises: 15 reps;Limitations Heel Raises Limitations: toe raises Forward Lunges: 15 reps;Limitations Side Lunges: 15 reps Functional Squat: 15 reps;Limitations Functional Squat Limitations: 3D hip excursion Stairs: 2RT with intermittent HHA Rocker Board: 2 minutes;Limitations Rocker Board Limitations: R/L and A/P SLS: Rt 13", Lt 15" max of 3 SLS with Vectors: 3x 5" without HHA Gait Training: 46' with min cueing for equal stance phase     Physical Therapy Assessment and Plan PT Assessment and Plan Clinical Impression Statement: Session focus on improving gait mechanics with cueing to improve stance phase and stride length, pt able to demonstrate proper gait mechanics following cueing.  Began hip mobilty exercises following report that his hips felt tight, pt given 3D hip excursion for HEP.  Pt was able to demonstrate  appropriate reciprocal pattern stair training safely with cueing for eccentric controlling due to weakness.  Added balance activities to improve stabilty wtih gait.  No reports of increased pain through session. PT Plan: Continue with current POC.  Progress to dynamic surfaces with vector stance and begin squat reaching activities within anterior hip precautions.    Goals PT Short Term Goals PT Short Term Goal 1: Pt to be ambulating without a cane inside PT Short Term Goal 1 - Progress: Progressing toward goal PT Short Term Goal 2: Pt pain to be no greater than a 3/10 PT Short Term Goal 2 - Progress: Progressing toward goal PT Short Term Goal 3: Pt to be able to go up a filight of steps in a reciprocal manner with hand hold PT Short Term Goal 3 - Progress: Progressing toward goal  Problem List Patient Active Problem List   Diagnosis Date Noted  . Proximal leg weakness 04/07/2014  . Difficulty in walking(719.7) 04/07/2014  . Impairment of balance 04/07/2014  . Primary localized osteoarthrosis, pelvic region and thigh 03/24/2014    PT - End of Session Activity Tolerance: Patient tolerated treatment well General Behavior During Therapy: WFL for tasks assessed/performed PT Plan of Care PT Home Exercise Plan: 3D hip excursion given today, discussed balance activities to complete at home safely  GP    Aldona Lento 04/19/2014, 12:25 PM

## 2014-04-21 ENCOUNTER — Ambulatory Visit (HOSPITAL_COMMUNITY)
Admission: RE | Admit: 2014-04-21 | Discharge: 2014-04-21 | Disposition: A | Discharge status: Home / Self Care | Payer: Medicare Other | Source: Ambulatory Visit | Attending: Orthopedic Surgery | Admitting: Orthopedic Surgery

## 2014-04-21 DIAGNOSIS — I10 Essential (primary) hypertension: Secondary | ICD-10-CM | POA: Insufficient documentation

## 2014-04-21 DIAGNOSIS — R2689 Other abnormalities of gait and mobility: Secondary | ICD-10-CM

## 2014-04-21 DIAGNOSIS — IMO0001 Reserved for inherently not codable concepts without codable children: Secondary | ICD-10-CM | POA: Diagnosis not present

## 2014-04-21 DIAGNOSIS — R29898 Other symptoms and signs involving the musculoskeletal system: Secondary | ICD-10-CM | POA: Insufficient documentation

## 2014-04-21 DIAGNOSIS — R262 Difficulty in walking, not elsewhere classified: Secondary | ICD-10-CM | POA: Insufficient documentation

## 2014-04-21 DIAGNOSIS — M25559 Pain in unspecified hip: Secondary | ICD-10-CM | POA: Insufficient documentation

## 2014-04-21 NOTE — Progress Notes (Signed)
Physical Therapy Treatment Patient Details  Name: Erik Odonnell MRN: [id number] Date of Birth: 1946/08/15  Today's Date: 04/21/2014 Time: 4132-4401 PT Time Calculation (min): 32 min Charge: TE 0272-5366  Visit#: 7 of 8  Re-eval: 05/07/14 Assessment Diagnosis: Rt TKR  Surgical Date: 03/24/14 Next MD Visit: Maretta Los 05/07/2014 Prior Therapy: HH  Authorization: [employer] medicare  Authorization Time Period:    Authorization Visit#: 7 of 10   Subjective: Symptoms/Limitations Symptoms: Pain free today, reports thigh a little stiff today.  Preparing to go to [employer].   Pain Assessment Currently in Pain?: No/denies  Precautions/Restrictions  Precautions Precautions: Anterior Hip  Exercise/Treatments Stretches Active Hamstring Stretch: 3 reps;30 seconds;Limitations Active Hamstring Stretch Limitations: 3 directions on 14in box Hip Flexor Stretch: 2 reps;20 seconds;Limitations Hip Flexor Stretch Limitations: on 14in step Gastroc Stretch: 3 reps;30 seconds;Limitations Gastroc Stretch Limitations: on slant board Aerobic Stationary Bike: nustep hills L4 x10' SPM goal >100 Standing Forward Lunges: 15 reps;Limitations Side Lunges: 15 reps Lateral Step Up: Right;10 reps;Hand Hold: 1;Step Height: 6" Forward Step Up: Right;Hand Hold: 1;Step Height: 6";10 reps Step Down: Hand Hold: 1;Step Height: 6";10 reps Functional Squat: 15 reps;Limitations Functional Squat Limitations: 3D hip excursion SLS: Rt 18", Lt 30" SLS with Vectors: 5x5 without HHA    Physical Therapy Assessment and Plan PT Assessment and Plan Clinical Impression Statement: Pt progressing well towards goals.  Pt ambulates without AD indoor and outdoor setting and reports confidence without AD.  Continued hip mobilty exercises to improve pelvic rotation with gait.  Progressed balance gait activtiies to improve confidence with gait and overall hip stabilty.  Added squat reaching matrix for gluteal strengthening.   PT  Plan: Reassess next session for probable discharge to HEP.  Continue with dynamic surface gait activties to improve confidence with balance and gait stabilty and functional strengthening activities.      Goals PT Short Term Goals PT Short Term Goal 1 - Progress: Met PT Short Term Goal 2: Pt pain to be no greater than a 3/10 PT Short Term Goal 2 - Progress: Progressing toward goal PT Short Term Goal 3: Pt to be able to go up a filight of steps in a reciprocal manner with hand hold PT Short Term Goal 3 - Progress: Progressing toward goal PT Long Term Goals PT Long Term Goal 1: Pt to be I in advance HEP PT Long Term Goal 2: Pt to be able to ambulate with confidence outside without a cane PT Long Term Goal 2 - Progress: Met Long Term Goal 3: Pt to be able to go up 29 steps in a reciprocal manner to get into his beach house.   Long Term Goal 4: no pain  Problem List Patient Active Problem List   Diagnosis Date Noted  . Proximal leg weakness 04/07/2014  . Difficulty in walking(719.7) 04/07/2014  . Impairment of balance 04/07/2014  . Primary localized osteoarthrosis, pelvic region and thigh 03/24/2014    PT - End of Session Activity Tolerance: Patient tolerated treatment well General Behavior During Therapy: [facility] for tasks assessed/performed  GP    Aldona Lento 04/21/2014, 11:00 AM

## 2014-04-26 ENCOUNTER — Ambulatory Visit (HOSPITAL_COMMUNITY): Payer: Medicare Other

## 2014-04-28 ENCOUNTER — Ambulatory Visit (HOSPITAL_COMMUNITY): Payer: Medicare Other | Admitting: Physical Therapy

## 2014-05-03 ENCOUNTER — Ambulatory Visit (HOSPITAL_COMMUNITY)
Admission: RE | Admit: 2014-05-03 | Discharge: 2014-05-03 | Disposition: A | Discharge status: Home / Self Care | Payer: Medicare Other | Source: Ambulatory Visit | Attending: Family Medicine | Admitting: Family Medicine

## 2014-05-03 DIAGNOSIS — R2689 Other abnormalities of gait and mobility: Secondary | ICD-10-CM

## 2014-05-03 DIAGNOSIS — R262 Difficulty in walking, not elsewhere classified: Secondary | ICD-10-CM

## 2014-05-03 DIAGNOSIS — R29898 Other symptoms and signs involving the musculoskeletal system: Secondary | ICD-10-CM

## 2014-05-03 DIAGNOSIS — IMO0001 Reserved for inherently not codable concepts without codable children: Secondary | ICD-10-CM | POA: Diagnosis not present

## 2014-05-03 NOTE — Evaluation (Addendum)
Physical Therapy reassessment/discharge Patient Details  Name: Erik Odonnell MRN: [id number] Date of Birth: [birth year]  Today's Date: 05/03/2014 Time: 1300-1325 PT Time Calculation (min): 25 min Charge:  Mm test 2409-7353; self care; 1315-1325             Visit#: 8 of 8  Re-eval:   Assessment Diagnosis: Rt TKR  Surgical Date: 03/24/14 Next MD Visit: Maretta Los 05/07/2014 Prior Therapy: HH  Authorization: [employer] medicare     Authorization Visit#: 8 of 8   Past Medical History:  Past Medical History  Diagnosis Date  . Hemifacial spasm     takes Artane daily  . Hypertension     takes Ziac daily  . History of bronchitis 11/2013  . Arthritis   . Joint pain   . Diverticulosis    Past Surgical History:  Past Surgical History  Procedure Laterality Date  . Hernia repair Left     as a child-inguinal;right inguinal in 1969  . Eye muschle surgeries  as a child    x 5  . Colonoscopy    . Total hip arthroplasty Right 03/24/2014    Procedure: TOTAL HIP ARTHROPLASTY ANTERIOR APPROACH;  Surgeon: Ninetta Lights, MD;  Location: [place];  Service: Orthopedics;  Laterality: Right;    Subjective Symptoms/Limitations Symptoms: Erik Odonnell states that his hip is stiff in the morning but after he walks around it feels beter.  He states that at night his hip aches like a toothache but it is not enough to keep him up.  He is not taking any pain medication  How long can you sit comfortably?: able to sit as long as he wanted  How long can you stand comfortably?: Pt is able to stand as long as he wants to was several minutes.  How long can you walk comfortably?: Pt is not longer walking with a cane he is able to walk for over a mile was ten minutes with a cane.  Pain Assessment Currently in Pain?: No/denies  Precautions/Restrictions  Precautions Precautions: Anterior Hip    Sensation/Coordination/Flexibility/Functional Tests Functional Tests Functional Tests: foto 88 was 76  Assessment RLE  Strength Right Hip Flexion: 5/5 (was 5/5) Right Hip Extension: 5/5 Right Hip ABduction: 5/5 (was 4-/5) Right Hip ADduction: 5/5 (was 5/5) Right Knee Flexion: 5/5 Right Knee Extension: 5/5 (was 5/5) Right Ankle Dorsiflexion: 5/5 (was 5/5 )  Exercise/Treatments Mobility/Balance  Static Standing Balance Single Leg Stance - Right Leg: 16 Single Leg Stance - Left Leg: 18     Physical Therapy Assessment and Plan PT Assessment and Plan Clinical Impression Statement: Pt has met all goals and is ready to be discharged PT Plan: discharge from skilled therapy.    Goals Home Exercise Program Pt/caregiver will Perform Home Exercise Program: For increased strengthening PT Goal: Perform Home Exercise Program - Progress: Met PT Short Term Goals Time to Complete Short Term Goals: 2 weeks PT Short Term Goal 1: Pt to be ambulating without a cane inside PT Short Term Goal 1 - Progress: Met PT Short Term Goal 2: Pt pain to be no greater than a 3/10 PT Short Term Goal 2 - Progress: Met PT Short Term Goal 3: Pt to be able to go up a filight of steps in a reciprocal manner with hand hold PT Short Term Goal 3 - Progress: Met PT Long Term Goals Time to Complete Long Term Goals: 4 weeks PT Long Term Goal 1: Pt to be I in advance HEP PT Long Term Goal  1 - Progress: Met PT Long Term Goal 2: Pt to be able to ambulate with confidence outside without a cane PT Long Term Goal 2 - Progress: Met Long Term Goal 3: Pt to be able to go up 29 steps in a reciprocal manner to get into his beach house.   Long Term Goal 3 Progress: Met Long Term Goal 4: no pain Long Term Goal 4 Progress: Progressing toward goal  Problem List Patient Active Problem List   Diagnosis Date Noted  . Proximal leg weakness 04/07/2014  . Difficulty in walking(719.7) 04/07/2014  . Impairment of balance 04/07/2014  . Primary localized osteoarthrosis, pelvic region and thigh 03/24/2014       GP Functional Assessment Tool Used:  foto Functional Limitation: Mobility: Walking and moving around Mobility: Walking and Moving Around Goal Status [phone]): At least 1 percent but less than 20 percent impaired, limited or restricted Mobility: Walking and Moving Around Discharge Status [phone]): At least 1 percent but less than 20 percent impaired, limited or restricted  Erik Odonnell 05/03/2014, 1:33 PM  Physician Documentation Your signature is required to indicate approval of the treatment plan as stated above.  Please sign and either send electronically or make a copy of this report for your files and return this physician signed original.   Please mark one 1.__approve of plan  2. ___approve of plan with the following conditions.   ______________________________                                                          _____________________ Physician Signature                                                                                                             Date

## 2014-05-06 ENCOUNTER — Ambulatory Visit (HOSPITAL_COMMUNITY): Payer: Medicare Other | Admitting: Physical Therapy

## 2014-05-10 ENCOUNTER — Ambulatory Visit (HOSPITAL_COMMUNITY): Payer: Medicare Other | Admitting: Physical Therapy

## 2014-05-12 ENCOUNTER — Ambulatory Visit (HOSPITAL_COMMUNITY): Payer: Medicare Other | Admitting: Physical Therapy

## 2015-05-11 IMAGING — CR DG PORTABLE PELVIS
1 series · 1 of 1 positions shown · non-contrast
Comparison: None.

CLINICAL DATA: Postop right hip

EXAM:
PORTABLE PELVIS 1-2 VIEWS

[AP]
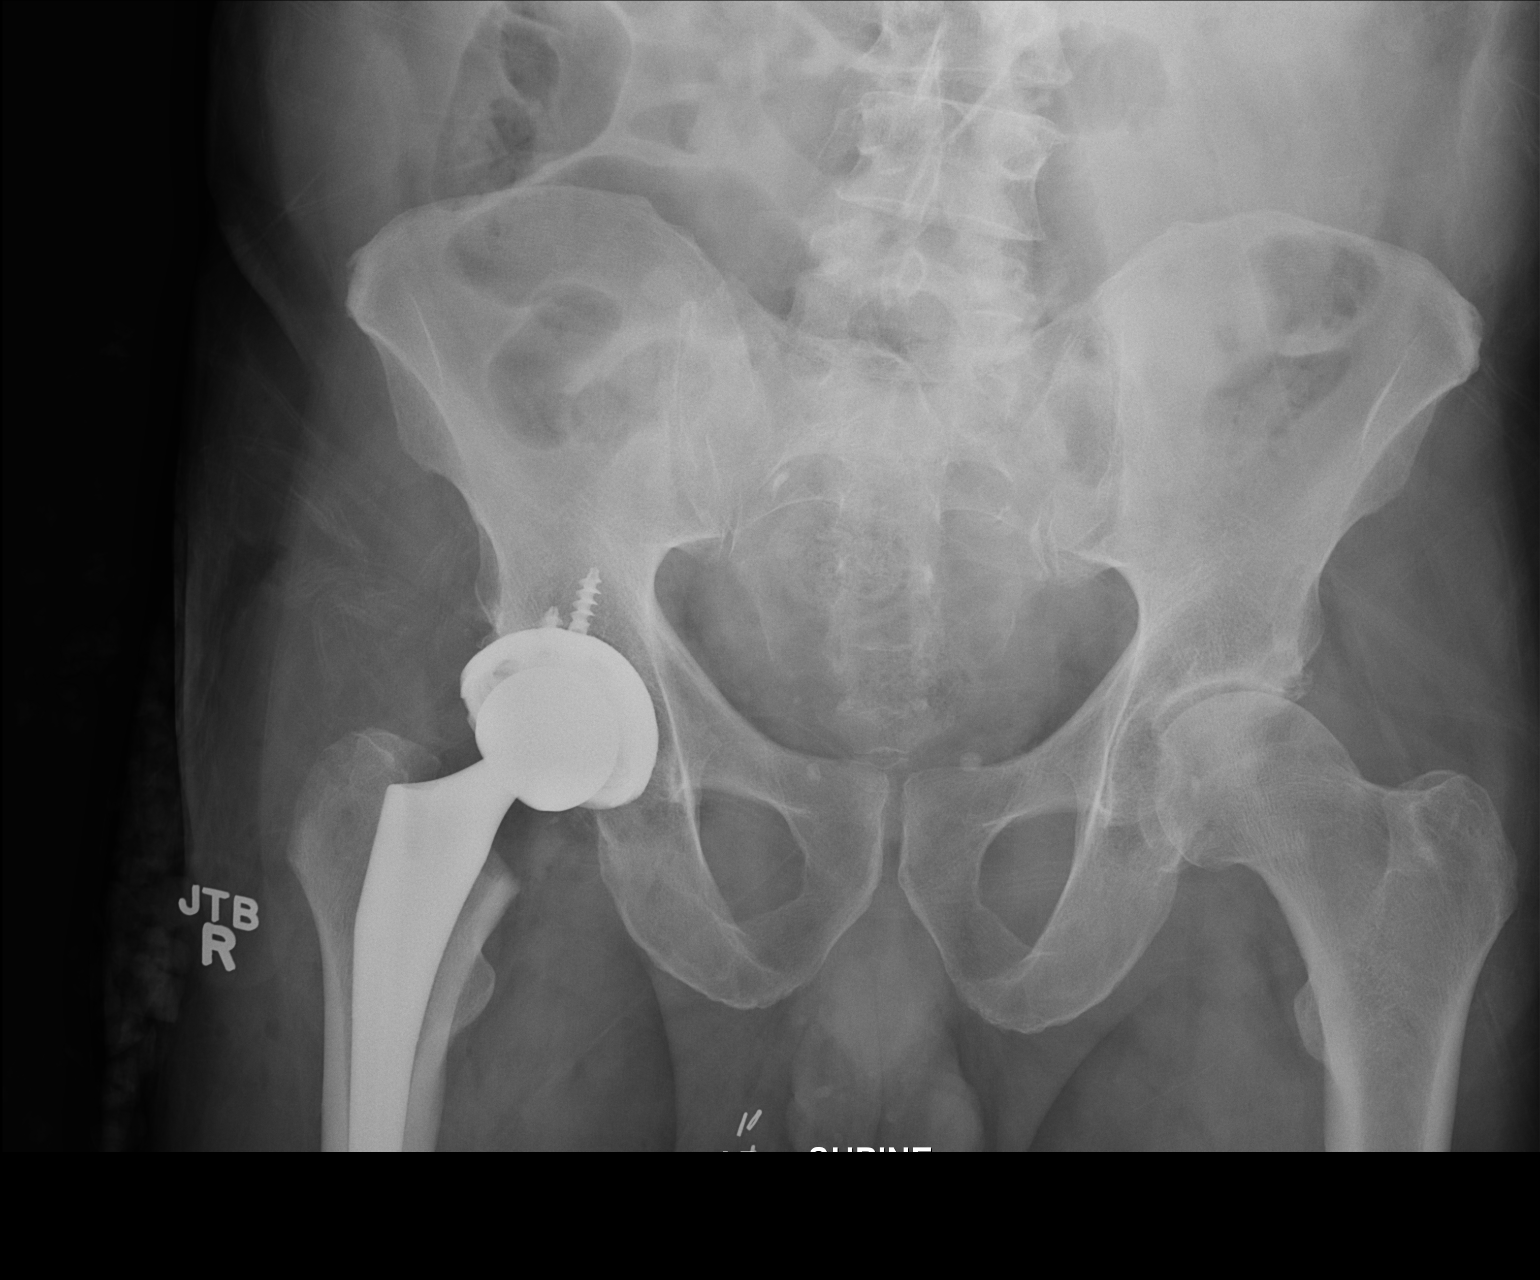

[1 of 1 positions shown; findings below may reference images not displayed]

FINDINGS: Right total hip arthroplasty in satisfactory position.

Mild narrowing of the left hip joint space.

Visualized bony pelvis appears intact.

Mild degenerative changes of the lower lumbar spine.
IMPRESSION: Right total hip arthroplasty in satisfactory position.

## 2017-01-08 ENCOUNTER — Encounter (INDEPENDENT_AMBULATORY_CARE_PROVIDER_SITE_OTHER): Payer: Self-pay | Admitting: *Deleted

## 2017-09-11 ENCOUNTER — Other Ambulatory Visit (INDEPENDENT_AMBULATORY_CARE_PROVIDER_SITE_OTHER): Payer: Self-pay | Admitting: *Deleted

## 2017-09-11 DIAGNOSIS — Z1211 Encounter for screening for malignant neoplasm of colon: Secondary | ICD-10-CM | POA: Insufficient documentation

## 2017-10-31 ENCOUNTER — Encounter (INDEPENDENT_AMBULATORY_CARE_PROVIDER_SITE_OTHER): Payer: Self-pay | Admitting: *Deleted

## 2017-10-31 ENCOUNTER — Telehealth (INDEPENDENT_AMBULATORY_CARE_PROVIDER_SITE_OTHER): Payer: Self-pay | Admitting: *Deleted

## 2017-10-31 MED ORDER — PEG 3350-KCL-NA BICARB-NACL 420 G PO SOLR: 4000.0000 mL | Freq: Once | ORAL | 0 refills | Status: AC

## 2017-10-31 NOTE — Telephone Encounter (Signed)
Patient needs trilyte 

## 2017-11-20 ENCOUNTER — Telehealth (INDEPENDENT_AMBULATORY_CARE_PROVIDER_SITE_OTHER): Payer: Self-pay | Admitting: *Deleted

## 2017-11-20 NOTE — Telephone Encounter (Signed)
agree

## 2017-11-20 NOTE — Telephone Encounter (Signed)
Referring MD/PCP: fusco   Procedure: tcs  Reason/Indication:  screening  Has patient had this procedure before?  Yes, 10 yrs sgo  If so, when, by whom and where?    Is there a family history of colon cancer?  no  Who?  What age when diagnosed?    Is patient diabetic?   no      Does patient have prosthetic heart valve or mechanical valve?  no  Do you have a pacemaker?  no  Has patient ever had endocarditis? no  Has patient had joint replacement within last 12 months?  no  Is patient constipated or take laxatives? no  Does patient have a history of alcohol/drug use?  no  Is patient on Coumadin, Plavix and/or Aspirin? yes  Medications: see epic  Allergies: nkda  Medication Adjustment per Dr Laural Golden: asa 2 days  Procedure date & time: 12/18/17 930

## 2017-12-18 ENCOUNTER — Encounter (HOSPITAL_COMMUNITY)
Admission: RE | Disposition: A | Discharge status: Home / Self Care | Payer: Self-pay | Source: Ambulatory Visit | Attending: Internal Medicine

## 2017-12-18 ENCOUNTER — Encounter (HOSPITAL_COMMUNITY): Payer: Self-pay | Admitting: *Deleted

## 2017-12-18 ENCOUNTER — Ambulatory Visit (HOSPITAL_COMMUNITY)
Admission: RE | Admit: 2017-12-18 | Discharge: 2017-12-18 | Disposition: A | Discharge status: Home / Self Care | Payer: Medicare Other | Source: Ambulatory Visit | Attending: Internal Medicine | Admitting: Internal Medicine

## 2017-12-18 ENCOUNTER — Other Ambulatory Visit: Payer: Self-pay

## 2017-12-18 DIAGNOSIS — G5139 Clonic hemifacial spasm, unspecified: Secondary | ICD-10-CM | POA: Insufficient documentation

## 2017-12-18 DIAGNOSIS — Z79899 Other long term (current) drug therapy: Secondary | ICD-10-CM | POA: Diagnosis not present

## 2017-12-18 DIAGNOSIS — Z8719 Personal history of other diseases of the digestive system: Secondary | ICD-10-CM | POA: Diagnosis not present

## 2017-12-18 DIAGNOSIS — I10 Essential (primary) hypertension: Secondary | ICD-10-CM | POA: Insufficient documentation

## 2017-12-18 DIAGNOSIS — K648 Other hemorrhoids: Secondary | ICD-10-CM

## 2017-12-18 DIAGNOSIS — Z96641 Presence of right artificial hip joint: Secondary | ICD-10-CM | POA: Insufficient documentation

## 2017-12-18 DIAGNOSIS — K5730 Diverticulosis of large intestine without perforation or abscess without bleeding: Secondary | ICD-10-CM

## 2017-12-18 DIAGNOSIS — Z7982 Long term (current) use of aspirin: Secondary | ICD-10-CM | POA: Insufficient documentation

## 2017-12-18 DIAGNOSIS — Z1211 Encounter for screening for malignant neoplasm of colon: Secondary | ICD-10-CM | POA: Diagnosis not present

## 2017-12-18 DIAGNOSIS — M1990 Unspecified osteoarthritis, unspecified site: Secondary | ICD-10-CM | POA: Diagnosis not present

## 2017-12-18 HISTORY — PX: COLONOSCOPY: SHX5424

## 2017-12-18 SURGERY — COLONOSCOPY
Anesthesia: Moderate Sedation

## 2017-12-18 MED ORDER — SODIUM CHLORIDE 0.9 % IV SOLN
INTRAVENOUS | Status: DC
  Administered 2017-12-18: 20 mL/h via INTRAVENOUS

## 2017-12-18 MED ORDER — MEPERIDINE HCL 50 MG/ML IJ SOLN
INTRAMUSCULAR | Status: AC
  Filled 2017-12-18: qty 1

## 2017-12-18 MED ORDER — STERILE WATER FOR IRRIGATION IR SOLN
Status: DC | PRN
  Administered 2017-12-18: 10:00:00

## 2017-12-18 MED ORDER — MIDAZOLAM HCL 5 MG/5ML IJ SOLN
INTRAMUSCULAR | Status: AC
  Filled 2017-12-18: qty 10

## 2017-12-18 MED ORDER — MEPERIDINE HCL 50 MG/ML IJ SOLN
INTRAMUSCULAR | Status: DC | PRN
  Administered 2017-12-18 (×2): 25 mg via INTRAVENOUS

## 2017-12-18 MED ORDER — MIDAZOLAM HCL 5 MG/5ML IJ SOLN
INTRAMUSCULAR | Status: DC | PRN
  Administered 2017-12-18: 2 mg via INTRAVENOUS
  Administered 2017-12-18: 1 mg via INTRAVENOUS
  Administered 2017-12-18: 2 mg via INTRAVENOUS
  Administered 2017-12-18: 1 mg via INTRAVENOUS

## 2017-12-18 NOTE — Discharge Instructions (Signed)
Resume usual medications including aspirin as before. High fiber diet. No driving for 24 hours.   Colonoscopy, Adult, Care After This sheet gives you information about how to care for yourself after your procedure. Your doctor may also give you more specific instructions. If you have problems or questions, call your doctor. Follow these instructions at home: General instructions   For the first 24 hours after the procedure: ? Do not drive or use machinery. ? Do not sign important documents. ? Do not drink alcohol. ? Do your daily activities more slowly than normal. ? Eat foods that are soft and easy to digest. ? Rest often.  Take over-the-counter or prescription medicines only as told by your doctor.  It is up to you to get the results of your procedure. Ask your doctor, or the department performing the procedure, when your results will be ready. To help cramping and bloating:  Try walking around.  Put heat on your belly (abdomen) as told by your doctor. Use a heat source that your doctor recommends, such as a moist heat pack or a heating pad. ? Put a towel between your skin and the heat source. ? Leave the heat on for 20-30 minutes. ? Remove the heat if your skin turns bright red. This is especially important if you cannot feel pain, heat, or cold. You can get burned. Eating and drinking  Drink enough fluid to keep your pee (urine) clear or pale yellow.  Return to your normal diet as told by your doctor. Avoid heavy or fried foods that are hard to digest.  Avoid drinking alcohol for as long as told by your doctor. Contact a doctor if:  You have blood in your poop (stool) 2-3 days after the procedure. Get help right away if:  You have more than a small amount of blood in your poop.  You see large clumps of tissue (blood clots) in your poop.  Your belly is swollen.  You feel sick to your stomach (nauseous).  You throw up (vomit).  You have a fever.  You have belly  pain that gets worse, and medicine does not help your pain. This information is not intended to replace advice given to you by your health care provider. Make sure you discuss any questions you have with your health care provider. Document Released: 11/10/2010 Document Revised: 07/02/2016 Document Reviewed: 07/02/2016 Elsevier Interactive Patient Education  2017 [employer]  Hemorrhoids Hemorrhoids are swollen veins in and around the rectum or anus. There are two types of hemorrhoids:  Internal hemorrhoids. These occur in the veins that are just inside the rectum. They may poke through to the outside and become irritated and painful.  External hemorrhoids. These occur in the veins that are outside of the anus and can be felt as a painful swelling or hard lump near the anus.  Most hemorrhoids do not cause serious problems, and they can be managed with home treatments such as diet and lifestyle changes. If home treatments do not help your symptoms, procedures can be done to shrink or remove the hemorrhoids. What are the causes? This condition is caused by increased pressure in the anal area. This pressure may result from various things, including:  Constipation.  Straining to have a bowel movement.  Diarrhea.  Pregnancy.  Obesity.  Sitting for long periods of time.  Heavy lifting or other activity that causes you to strain.  Anal sex.  What are the signs or symptoms? Symptoms of this condition include:  Pain.  Anal itching or irritation.  Rectal bleeding.  Leakage of stool (feces).  Anal swelling.  One or more lumps around the anus.  How is this diagnosed? This condition can often be diagnosed through a visual exam. Other exams or tests may also be done, such as:  Examination of the rectal area with a gloved hand (digital rectal exam).  Examination of the anal canal using a small tube (anoscope).  A blood test, if you have lost a significant amount of  blood.  A test to look inside the colon (sigmoidoscopy or colonoscopy).  How is this treated? This condition can usually be treated at home. However, various procedures may be done if dietary changes, lifestyle changes, and other home treatments do not help your symptoms. These procedures can help make the hemorrhoids smaller or remove them completely. Some of these procedures involve surgery, and others do not. Common procedures include:  Rubber band ligation. Rubber bands are placed at the base of the hemorrhoids to cut off the blood supply to them.  Sclerotherapy. Medicine is injected into the hemorrhoids to shrink them.  Infrared coagulation. A type of light energy is used to get rid of the hemorrhoids.  Hemorrhoidectomy surgery. The hemorrhoids are surgically removed, and the veins that supply them are tied off.  Stapled hemorrhoidopexy surgery. A circular stapling device is used to remove the hemorrhoids and use staples to cut off the blood supply to them.  Follow these instructions at home: Eating and drinking  Eat foods that have a lot of fiber in them, such as whole grains, beans, nuts, fruits, and vegetables. Ask your health care provider about taking products that have added fiber (fiber supplements).  Drink enough fluid to keep your urine clear or pale yellow. Managing pain and swelling  Take warm sitz baths for 20 minutes, 3-4 times a day to ease pain and discomfort.  If directed, apply ice to the affected area. Using ice packs between sitz baths may be helpful. ? Put ice in a plastic bag. ? Place a towel between your skin and the bag. ? Leave the ice on for 20 minutes, 2-3 times a day. General instructions  Take over-the-counter and prescription medicines only as told by your health care provider.  Use medicated creams or suppositories as told.  Exercise regularly.  Go to the bathroom when you have the urge to have a bowel movement. Do not wait.  Avoid straining  to have bowel movements.  Keep the anal area dry and clean. Use wet toilet paper or moist towelettes after a bowel movement.  Do not sit on the toilet for long periods of time. This increases blood pooling and pain. Contact a health care provider if:  You have increasing pain and swelling that are not controlled by treatment or medicine.  You have uncontrolled bleeding.  You have difficulty having a bowel movement, or you are unable to have a bowel movement.  You have pain or inflammation outside the area of the hemorrhoids. This information is not intended to replace advice given to you by your health care provider. Make sure you discuss any questions you have with your health care provider. Document Released: 10/05/2000 Document Revised: 03/07/2016 Document Reviewed: 06/22/2015 Elsevier Interactive Patient Education  2018 [employer].  Diverticulosis Diverticulosis is a condition that develops when small pouches (diverticula) form in the wall of the large intestine (colon). The colon is where water is absorbed and stool is formed. The pouches form when the inside  layer of the colon pushes through weak spots in the outer layers of the colon. You may have a few pouches or many of them. What are the causes? The cause of this condition is not known. What increases the risk? The following factors may make you more likely to develop this condition:  Being older than age 63. Your risk for this condition increases with age. Diverticulosis is rare among people younger than age 74. By age 81, many people have it.  Eating a low-fiber diet.  Having frequent constipation.  Being overweight.  Not getting enough exercise.  Smoking.  Taking over-the-counter pain medicines, like aspirin and ibuprofen.  Having a family history of diverticulosis.  What are the signs or symptoms? In most people, there are no symptoms of this condition. If you do have symptoms, they may  include:  Bloating.  Cramps in the abdomen.  Constipation or diarrhea.  Pain in the lower left side of the abdomen.  How is this diagnosed? This condition is most often diagnosed during an exam for other colon problems. Because diverticulosis usually has no symptoms, it often cannot be diagnosed independently. This condition may be diagnosed by:  Using a flexible scope to examine the colon (colonoscopy).  Taking an X-ray of the colon after dye has been put into the colon (barium enema).  Doing a CT scan.  How is this treated? You may not need treatment for this condition if you have never developed an infection related to diverticulosis. If you have had an infection before, treatment may include:  Eating a high-fiber diet. This may include eating more fruits, vegetables, and grains.  Taking a fiber supplement.  Taking a live bacteria supplement (probiotic).  Taking medicine to relax your colon.  Taking antibiotic medicines.  Follow these instructions at home:  Drink 6-8 glasses of water or more each day to prevent constipation.  Try not to strain when you have a bowel movement.  If you have had an infection before: ? Eat more fiber as directed by your health care provider or your diet and nutrition specialist (dietitian). ? Take a fiber supplement or probiotic, if your health care provider approves.  Take over-the-counter and prescription medicines only as told by your health care provider.  If you were prescribed an antibiotic, take it as told by your health care provider. Do not stop taking the antibiotic even if you start to feel better.  Keep all follow-up visits as told by your health care provider. This is important. Contact a health care provider if:  You have pain in your abdomen.  You have bloating.  You have cramps.  You have not had a bowel movement in 3 days. Get help right away if:  Your pain gets worse.  Your bloating becomes very bad.  You  have a fever or chills, and your symptoms suddenly get worse.  You vomit.  You have bowel movements that are bloody or black.  You have bleeding from your rectum. Summary  Diverticulosis is a condition that develops when small pouches (diverticula) form in the wall of the large intestine (colon).  You may have a few pouches or many of them.  This condition is most often diagnosed during an exam for other colon problems.  If you have had an infection related to diverticulosis, treatment may include increasing the fiber in your diet, taking supplements, or taking medicines. This information is not intended to replace advice given to you by your health care provider. Make sure  you discuss any questions you have with your health care provider. Document Released: 07/05/2004 Document Revised: 08/27/2016 Document Reviewed: 08/27/2016 Elsevier Interactive Patient Education  2017 [employer].

## 2017-12-18 NOTE — Op Note (Signed)
[facility] Patient Name: Erik Odonnell Procedure Date: 12/18/2017 9:23 AM MRN: [id number] Date of Birth: 1946/04/17 Attending MD: Hildred Laser , MD CSN: [id number] Age: 72 Admit Type: Outpatient Procedure:                Colonoscopy Indications:              Screening for colorectal malignant neoplasm Providers:                Hildred Laser, MD, Otis Peak B. Sharon Seller, RN, Nelma Rothman, Technician Referring MD:             Redmond School, MD Medicines:                Meperidine 50 mg IV, Midazolam 6 mg IV Complications:            No immediate complications. Estimated Blood Loss:     Estimated blood loss: none. Procedure:                Pre-Anesthesia Assessment:                           - Prior to the procedure, a History and Physical                            was performed, and patient medications and                            allergies were reviewed. The patient's tolerance of                            previous anesthesia was also reviewed. The risks                            and benefits of the procedure and the sedation                            options and risks were discussed with the patient.                            All questions were answered, and informed consent                            was obtained. Prior Anticoagulants: The patient                            last took aspirin 3 days and ibuprofen 7 days prior                            to the procedure. ASA Grade Assessment: II - A                            patient with mild systemic disease. After reviewing  the risks and benefits, the patient was deemed in                            satisfactory condition to undergo the procedure.                           After obtaining informed consent, the colonoscope                            was passed under direct vision. Throughout the                            procedure, the patient's blood pressure, pulse, and                             oxygen saturations were monitored continuously. The                            EC-[id number] ([id number]) scope was introduced through                            the anus and advanced to the the cecum, identified                            by appendiceal orifice and ileocecal valve. The                            colonoscopy was somewhat difficult due to                            significant looping and a tortuous colon. The                            patient tolerated the procedure well. The quality                            of the bowel preparation was adequate. The                            ileocecal valve, appendiceal orifice, and rectum                            were photographed. Scope In: 9:44:18 AM Scope Out: 10:02:47 AM Scope Withdrawal Time: 0 hours 9 minutes 9 seconds  Total Procedure Duration: 0 hours 18 minutes 29 seconds  Findings:      The perianal and digital rectal examinations were normal.      Multiple small and large-mouthed diverticula were found in the sigmoid       colon.      Scattered small and large-mouthed diverticula were found in the       descending colon, splenic flexure, transverse colon and hepatic flexure.      Internal hemorrhoids were found during retroflexion. The hemorrhoids       were small. Impression:               -  Diverticulosis in the sigmoid colon.                           - Diverticulosis in the descending colon, at the                            splenic flexure, in the transverse colon and at the                            hepatic flexure.                           - Internal hemorrhoids.                           - No specimens collected. Moderate Sedation:      Moderate (conscious) sedation was administered by the endoscopy nurse       and supervised by the endoscopist. The following parameters were       monitored: oxygen saturation, heart rate, blood pressure, CO2       capnography and response to care.  Total physician intraservice time was       24 minutes. Recommendation:           - Patient has a contact number available for                            emergencies. The signs and symptoms of potential                            delayed complications were discussed with the                            patient. Return to normal activities tomorrow.                            Written discharge instructions were provided to the                            patient.                           - High fiber diet today.                           - Continue present medications.                           - Resume aspirin at prior dose today.                           - No repeat colonoscopy due to age and the absence                            of advanced adenomas. Procedure Code(s):        --- Professional ---  [phone], Colonoscopy, flexible; diagnostic, including                            collection of specimen(s) by brushing or washing,                            when performed (separate procedure)                           99152, Moderate sedation services provided by the                            same physician or other qualified health care                            professional performing the diagnostic or                            therapeutic service that the sedation supports,                            requiring the presence of an independent trained                            observer to assist in the monitoring of the                            patient's level of consciousness and physiological                            status; initial 15 minutes of intraservice time,                            patient age 72 years or older                           [id number], Moderate sedation services; each additional                            15 minutes intraservice time Diagnosis Code(s):        --- Professional ---                           Z12.11, Encounter for screening for  malignant                            neoplasm of colon                           K64.8, Other hemorrhoids                           K57.30, Diverticulosis of large intestine without                            perforation or abscess  without bleeding CPT copyright 2016 American Medical Association. All rights reserved. The codes documented in this report are preliminary and upon coder review may  be revised to meet current compliance requirements. Hildred Laser, MD Hildred Laser, MD 12/18/2017 10:08:54 AM This report has been signed electronically. Number of Addenda: 0

## 2017-12-18 NOTE — H&P (Signed)
Erik Odonnell is an 72 y.o. male.   Chief Complaint: Patient is here for colonoscopy.   HPI: Patient is 72 year old Caucasian male who is here for screening colonoscopy.  Last exam in August 2007 revealed colonic diverticulosis but no polyps.  He denies abdominal pain change in bowel habits or rectal bleeding. On low-dose aspirin.  Last dose was 3 days ago.  He also takes OTC ibuprofen as needed for left hip pain but not daily. Family History is negative for CRC.  Past Medical History:  Diagnosis Date  . Arthritis   . Diverticulosis   . Hemifacial spasm    takes Artane daily  . History of bronchitis 11/2013  . Hypertension    takes Ziac daily  . Joint pain     Past Surgical History:  Procedure Laterality Date  . COLONOSCOPY    . eye muschle surgeries  as a child   x 5  . HERNIA REPAIR Left    as a child-inguinal;right inguinal in 1969  . TOTAL HIP ARTHROPLASTY Right 03/24/2014   Procedure: TOTAL HIP ARTHROPLASTY ANTERIOR APPROACH;  Surgeon: Ninetta Lights, MD;  Location: [place];  Service: Orthopedics;  Laterality: Right;    Family History  Problem Relation Age of Onset  . Colon cancer Neg Hx    Social History:  reports that  has never smoked. he has never used smokeless tobacco. He reports that he drinks alcohol. He reports that he does not use drugs.  Allergies: No Known Allergies  Medications Prior to Admission  Medication Sig Dispense Refill  . allopurinol (ZYLOPRIM) 300 MG tablet Take 300 mg by mouth daily.    Marland Kitchen ALPRAZolam (XANAX) 0.5 MG tablet Take 0.5 mg by mouth at bedtime as needed for anxiety.    Marland Kitchen aspirin EC 81 MG tablet Take 81 mg by mouth daily.    . bisoprolol-hydrochlorothiazide (ZIAC) 2.5-6.25 MG per tablet Take 1 tablet by mouth daily.    . hydrocortisone (PROCTOSOL HC) 2.5 % rectal cream Place 1 application rectally 2 (two) times daily.    . polyethylene glycol-electrolytes (NULYTELY/GOLYTELY) 420 g solution Take 4,000 mLs by mouth once.  0    No results  found for this or any previous visit (from the past 48 hour(s)). No results found.  ROS  Blood pressure (!) 144/99, pulse (!) 57, temperature 97.7 F (36.5 C), temperature source Oral, resp. rate 17, height 5\' 8"  (1.727 m), weight 195 lb (88.5 kg), SpO2 98 %. Physical Exam  Constitutional: He appears well-developed and well-nourished.  HENT:  Mouth/Throat: Oropharynx is clear and moist.  Eyes: Conjunctivae are normal. No scleral icterus.  Neck: No thyromegaly present.  Cardiovascular: Normal rate, regular rhythm and normal heart sounds.  No murmur heard. Respiratory: Effort normal and breath sounds normal.  GI: He exhibits no distension.  Abdomen is full.  Bilateral small inguinal scars noted.  Abdomen is soft and nontender with organomegaly or masses.  Musculoskeletal: He exhibits no edema.  Lymphadenopathy:    He has no cervical adenopathy.  Neurological: He is alert.  Skin: Skin is warm and dry.     Assessment/Plan Average risk screening colonoscopy.  Hildred Laser, MD 12/18/2017, 9:33 AM

## 2017-12-20 ENCOUNTER — Encounter (HOSPITAL_COMMUNITY): Payer: Self-pay | Admitting: Internal Medicine

## 2017-12-20 ENCOUNTER — Encounter (INDEPENDENT_AMBULATORY_CARE_PROVIDER_SITE_OTHER): Payer: Self-pay | Admitting: *Deleted

## 2018-08-04 DIAGNOSIS — M1612 Unilateral primary osteoarthritis, left hip: Secondary | ICD-10-CM | POA: Diagnosis present

## 2018-08-04 NOTE — H&P (Signed)
HIP ARTHROPLASTY ADMISSION H&P  Patient ID: Erik Odonnell MRN: [id number] DOB/AGE: 74-30-1947 72 y.o.  Chief Complaint: left hip pain.  Planned Procedure Date: 08/26/18 Medical Clearance by Dr. Gerarda Fraction    HPI: Erik Odonnell is a 72 y.o. male with a history of hypothyroidism, and gout who presents for evaluation of OA LEFT HIP.  He is status post right total hip arthroplasty by Dr. Amada Jupiter 03/25/2014.  The patient has a history of pain and functional disability in the left hip due to arthritis and has failed non-surgical conservative treatments for greater than 12 weeks to include NSAID's and/or analgesics and activity modification.  Onset of symptoms was gradual, starting 2 + years ago with gradually worsening course since that time.  Patient currently rates pain at 8 out of 10 with activity. Patient has night pain, worsening of pain with activity and weight bearing and pain that interferes with activities of daily living.  Patient has evidence of subchondral cysts, subchondral sclerosis, periarticular osteophytes and joint space narrowing by imaging studies.  There is no active infection.  Past Medical History:  Diagnosis Date  . Arthritis   . Diverticulosis   . Hemifacial spasm    takes Artane daily  . History of bronchitis 11/2013  . Hypertension    takes Ziac daily  . Joint pain    Past Surgical History:  Procedure Laterality Date  . COLONOSCOPY    . COLONOSCOPY N/A 12/18/2017   Procedure: COLONOSCOPY;  Surgeon: Rogene Houston, MD;  Location[facility];  Service: Endoscopy;  Laterality: N/A;  930  . eye muschle surgeries  as a child   x 5  . HERNIA REPAIR Left    as a child-inguinal;right inguinal in 1969  . TOTAL HIP ARTHROPLASTY Right 03/24/2014   Procedure: TOTAL HIP ARTHROPLASTY ANTERIOR APPROACH;  Surgeon: Ninetta Lights, MD;  Location: [facility];  Service: Orthopedics;  Laterality: Right;   No Known Allergies   Prior to Admission medications   Medication Sig Start Date  End Date Taking? Authorizing Provider  allopurinol (ZYLOPRIM) 300 MG tablet Take 300 mg by mouth daily.    [provider]  ALPRAZolam Duanne Moron) 0.5 MG tablet Take 0.5 mg by mouth at bedtime as needed for anxiety.    [provider]  aspirin EC 81 MG tablet Take 81 mg by mouth daily.    [provider]  bisoprolol-hydrochlorothiazide (ZIAC) 2.5-6.25 MG per tablet Take 1 tablet by mouth daily.    [provider]  hydrocortisone (PROCTOSOL HC) 2.5 % rectal cream Place 1 application rectally 2 (two) times daily.    [provider]  polyethylene glycol-electrolytes (NULYTELY/GOLYTELY) 420 g solution Take 4,000 mLs by mouth once. 12/03/17   [provider]   Social History: Married, non smoker.  2 alcoholic drinks several times per week.  Family History: Father and grandfather w/ history of MI.  ROS: Currently denies lightheadedness, dizziness, Fever, chills, CP, SOB.   No personal history of DVT, PE, MI, or CVA. No loose teeth or dentures.  He wears glasses or contacts. All other systems have been reviewed and were otherwise currently negative with the exception of those mentioned in the HPI and as above.  Objective: Vitals: Ht: 5'8" Wt: 218 Temp: 97.5 BP: 156/87 Pulse: 50 O2 93% on room air.   Physical Exam: General: Alert, NAD. Trendelenberg Gait without aid.  HEENT: EOMI, Good Neck Extension  Pulm: No increased work of breathing.  Clear B/L A/P w/o crackle  or wheeze.  CV: RRR, No m/g/r appreciated  GI: soft, NT, ND Neuro: Neuro without gross focal deficit.  Sensation intact distally Skin: No lesions in the area of chief complaint MSK/Surgical Site: Left Hip pain with passive ROM.  Positive Stinchfield.  5/5 strength.  NVI.  Sensation intact distally.  Imaging Review Plain radiographs demonstrate severe degenerative joint disease of the left hip.    Preoperative templating of the joint replacement has been completed, documented, and  submitted to the Operating Room personnel in order to optimize intra-operative equipment management.  Assessment: OA LEFT HIP Principal Problem:   Primary osteoarthritis of left hip   Plan: Plan for Procedure(s): LEFT TOTAL HIP ARTHROPLASTY ANTERIOR APPROACH  The patient history, physical exam, clinical judgement of the provider and imaging are consistent with end stage degenerative joint disease and total joint arthroplasty is deemed medically necessary. The treatment options including medical management, injection therapy, and arthroplasty were discussed at length. The risks and benefits of Procedure(s): LEFT TOTAL HIP ARTHROPLASTY ANTERIOR APPROACH were presented and reviewed.  The risks of nonoperative treatment, versus surgical intervention including but not limited to continued pain, aseptic loosening, stiffness, dislocation/subluxation, infection, bleeding, nerve injury, blood clots, cardiopulmonary complications, morbidity, mortality, among others were discussed. The patient verbalizes understanding and wishes to proceed with the plan.  Patient is being admitted for inpatient treatment for surgery, pain control, PT, OT, prophylactic antibiotics, VTE prophylaxis, progressive ambulation, ADL's and discharge planning.   Dental prophylaxis discussed and recommended for 2 years postoperatively.   The patient does meet the criteria for TXA which will be used perioperatively.    ASA 81 mg BID will be used postoperatively for DVT prophylaxis in addition to SCDs, and early ambulation.  Norco for breakthrough pain.  History of agitation and significant constipation with oxycodone.  The patient is planning to be discharged home with home health services [facility] home care) in care of his wife.   Prudencio Burly III, PA-C 08/04/2018 8:09 AM

## 2018-08-15 NOTE — Patient Instructions (Signed)
Erik Odonnell  08/15/2018   Your procedure is scheduled on: 08-26-18   Report to [facility] Main  Entrance    Report to admitting at 5:30AM    Call this number if you have problems the morning of surgery [phone]     Remember: Do not eat food or drink liquids :After Midnight. BRUSH YOUR TEETH MORNING OF SURGERY AND RINSE YOUR MOUTH OUT, NO CHEWING GUM CANDY OR MINTS.     Take these medicines the morning of surgery with A SIP OF WATER: allopurinol, levothyroxine                                 You may not have any metal on your body including hair pins and              piercings  Do not wear jewelry, make-up, lotions, powders or perfumes, deodorant                          Men may shave face and neck.   Do not bring valuables to the hospital. [place].  Contacts, dentures or bridgework may not be worn into surgery.  Leave suitcase in the car. After surgery it may be brought to your room.                 Please read over the following fact sheets you were given: _____________________________________________________________________             [facility] - Preparing for Surgery Before surgery, you can play an important role.  Because skin is not sterile, your skin needs to be as free of germs as possible.  You can reduce the number of germs on your skin by washing with CHG (chlorahexidine gluconate) soap before surgery.  CHG is an antiseptic cleaner which kills germs and bonds with the skin to continue killing germs even after washing. Please DO NOT use if you have an allergy to CHG or antibacterial soaps.  If your skin becomes reddened/irritated stop using the CHG and inform your nurse when you arrive at Short Stay. Do not shave (including legs and underarms) for at least 48 hours prior to the first CHG shower.  You may shave your face/neck. Please follow these instructions carefully:  1.  Shower  with CHG Soap the night before surgery and the  morning of Surgery.  2.  If you choose to wash your hair, wash your hair first as usual with your  normal  shampoo.  3.  After you shampoo, rinse your hair and body thoroughly to remove the  shampoo.                           4.  Use CHG as you would any other liquid soap.  You can apply chg directly  to the skin and wash                       Gently with a scrungie or clean washcloth.  5.  Apply the CHG Soap to your body ONLY FROM THE NECK DOWN.   Do not use on face/ open  Wound or open sores. Avoid contact with eyes, ears mouth and genitals (private parts).                       Wash face,  Genitals (private parts) with your normal soap.             6.  Wash thoroughly, paying special attention to the area where your surgery  will be performed.  7.  Thoroughly rinse your body with warm water from the neck down.  8.  DO NOT shower/wash with your normal soap after using and rinsing off  the CHG Soap.                9.  Pat yourself dry with a clean towel.            10.  Wear clean pajamas.            11.  Place clean sheets on your bed the night of your first shower and do not  sleep with pets. Day of Surgery : Do not apply any lotions/deodorants the morning of surgery.  Please wear clean clothes to the hospital/surgery center.  FAILURE TO FOLLOW THESE INSTRUCTIONS MAY RESULT IN THE CANCELLATION OF YOUR SURGERY PATIENT SIGNATURE_________________________________  NURSE SIGNATURE__________________________________  ________________________________________________________________________   Erik Odonnell  An incentive spirometer is a tool that can help keep your lungs clear and active. This tool measures how well you are filling your lungs with each breath. Taking long deep breaths may help reverse or decrease the chance of developing breathing (pulmonary) problems (especially infection) following:  A long period  of time when you are unable to move or be active. BEFORE THE PROCEDURE   If the spirometer includes an indicator to show your best effort, your nurse or respiratory therapist will set it to a desired goal.  If possible, sit up straight or lean slightly forward. Try not to slouch.  Hold the incentive spirometer in an upright position. INSTRUCTIONS FOR USE  1. Sit on the edge of your bed if possible, or sit up as far as you can in bed or on a chair. 2. Hold the incentive spirometer in an upright position. 3. Breathe out normally. 4. Place the mouthpiece in your mouth and seal your lips tightly around it. 5. Breathe in slowly and as deeply as possible, raising the piston or the ball toward the top of the column. 6. Hold your breath for 3-5 seconds or for as long as possible. Allow the piston or ball to fall to the bottom of the column. 7. Remove the mouthpiece from your mouth and breathe out normally. 8. Rest for a few seconds and repeat Steps 1 through 7 at least 10 times every 1-2 hours when you are awake. Take your time and take a few normal breaths between deep breaths. 9. The spirometer may include an indicator to show your best effort. Use the indicator as a goal to work toward during each repetition. 10. After each set of 10 deep breaths, practice coughing to be sure your lungs are clear. If you have an incision (the cut made at the time of surgery), support your incision when coughing by placing a pillow or rolled up towels firmly against it. Once you are able to get out of bed, walk around indoors and cough well. You may stop using the incentive spirometer when instructed by your caregiver.  RISKS AND COMPLICATIONS  Take your time so you do not get  dizzy or light-headed.  If you are in pain, you may need to take or ask for pain medication before doing incentive spirometry. It is harder to take a deep breath if you are having pain. AFTER USE  Rest and breathe slowly and easily.  It  can be helpful to keep track of a log of your progress. Your caregiver can provide you with a simple table to help with this. If you are using the spirometer at home, follow these instructions: [place] IF:   You are having difficultly using the spirometer.  You have trouble using the spirometer as often as instructed.  Your pain medication is not giving enough relief while using the spirometer.  You develop fever of 100.5 F (38.1 C) or higher. SEEK IMMEDIATE MEDICAL CARE IF:   You cough up bloody sputum that had not been present before.  You develop fever of 102 F (38.9 C) or greater.  You develop worsening pain at or near the incision site. MAKE SURE YOU:   Understand these instructions.  Will watch your condition.  Will get help right away if you are not doing well or get worse. Document Released: 02/18/2007 Document Revised: 12/31/2011 Document Reviewed: 04/21/2007 [facility] Patient Information 2014 [place], [place].   ________________________________________________________________________

## 2018-08-18 ENCOUNTER — Encounter (HOSPITAL_COMMUNITY): Payer: Self-pay

## 2018-08-18 ENCOUNTER — Other Ambulatory Visit: Payer: Self-pay

## 2018-08-18 ENCOUNTER — Encounter (HOSPITAL_COMMUNITY)
Admission: RE | Admit: 2018-08-18 | Discharge: 2018-08-18 | Disposition: A | Discharge status: Home / Self Care | Payer: Medicare Other | Source: Ambulatory Visit | Attending: Orthopedic Surgery | Admitting: Orthopedic Surgery

## 2018-08-18 DIAGNOSIS — Z01818 Encounter for other preprocedural examination: Secondary | ICD-10-CM | POA: Diagnosis not present

## 2018-08-18 DIAGNOSIS — I10 Essential (primary) hypertension: Secondary | ICD-10-CM | POA: Insufficient documentation

## 2018-08-18 LAB — CBC
HCT: 48.8 % (ref 39.0–52.0)
Hemoglobin: 16.3 g/dL (ref 13.0–17.0)
MCH: 33.1 pg (ref 26.0–34.0)
MCHC: 33.4 g/dL (ref 30.0–36.0)
MCV: 99 fL (ref 80.0–100.0)
NRBC: 0 % (ref 0.0–0.2)
Platelets: 161 10*3/uL (ref 150–400)
RBC: 4.93 MIL/uL (ref 4.22–5.81)
RDW: 13.3 % (ref 11.5–15.5)
WBC: 6.9 10*3/uL (ref 4.0–10.5)

## 2018-08-18 LAB — BASIC METABOLIC PANEL
Anion gap: 9 (ref 5–15)
BUN: 29 mg/dL — ABNORMAL HIGH (ref 8–23)
CALCIUM: 9.1 mg/dL (ref 8.9–10.3)
CO2: 26 mmol/L (ref 22–32)
CREATININE: 1.23 mg/dL (ref 0.61–1.24)
Chloride: 100 mmol/L (ref 98–111)
GFR calc non Af Amer: 56 mL/min — ABNORMAL LOW (ref >60)
Glucose, Bld: 107 mg/dL — ABNORMAL HIGH (ref 70–99)
Potassium: 4.2 mmol/L (ref 3.5–5.1)
SODIUM: 135 mmol/L (ref 135–145)

## 2018-08-18 LAB — SURGICAL PCR SCREEN
MRSA, PCR: NEGATIVE
Staphylococcus aureus: NEGATIVE

## 2018-08-18 NOTE — Progress Notes (Signed)
BMP ROUTED [place]

## 2018-08-25 MED ORDER — BUPIVACAINE LIPOSOME 1.3 % IJ SUSP
20.0000 mL | Freq: Once | INTRAMUSCULAR | Status: DC
  Filled 2018-08-25: qty 20

## 2018-08-25 NOTE — Anesthesia Preprocedure Evaluation (Signed)
Anesthesia Evaluation  Patient identified by MRN, date of birth, ID band Patient awake    Reviewed: Allergy & Precautions, H&P , NPO status , Patient's Chart, lab work & pertinent test results  History of Anesthesia Complications Negative for: history of anesthetic complications  Airway Mallampati: II       Dental  (+) Teeth Intact   Pulmonary neg pulmonary ROS,    breath sounds clear to auscultation       Cardiovascular hypertension,  Rhythm:Regular Rate:Normal     Neuro/Psych    GI/Hepatic negative GI ROS, Neg liver ROS,   Endo/Other  negative endocrine ROS  Renal/GU negative Renal ROS     Musculoskeletal  (+) Arthritis ,   Abdominal   Peds  Hematology negative hematology ROS (+)   Anesthesia Other Findings   Reproductive/Obstetrics                             Anesthesia Physical  Anesthesia Plan  ASA: II  Anesthesia Plan:    Post-op Pain Management:    Induction: Intravenous  PONV Risk Score and Plan: 2 and Ondansetron, Dexamethasone and Propofol infusion  Airway Management Planned:   Additional Equipment:   Intra-op Plan:   Post-operative Plan:   Informed Consent: I have reviewed the patients History and Physical, chart, labs and discussed the procedure including the risks, benefits and alternatives for the proposed anesthesia with the patient or authorized representative who has indicated his/her understanding and acceptance.   Dental advisory given  Plan Discussed with: CRNA and Surgeon  Anesthesia Plan Comments:         Anesthesia Quick Evaluation

## 2018-08-26 ENCOUNTER — Other Ambulatory Visit: Payer: Self-pay

## 2018-08-26 ENCOUNTER — Inpatient Hospital Stay (HOSPITAL_COMMUNITY): Payer: Medicare Other | Admitting: Anesthesiology

## 2018-08-26 ENCOUNTER — Inpatient Hospital Stay (HOSPITAL_COMMUNITY): Payer: Medicare Other

## 2018-08-26 ENCOUNTER — Encounter (HOSPITAL_COMMUNITY)
Admission: RE | Disposition: A | Discharge status: Home Health | Payer: Self-pay | Source: Ambulatory Visit | Attending: Orthopedic Surgery

## 2018-08-26 ENCOUNTER — Encounter (HOSPITAL_COMMUNITY): Payer: Self-pay | Admitting: Certified Registered Nurse Anesthetist

## 2018-08-26 ENCOUNTER — Inpatient Hospital Stay (HOSPITAL_COMMUNITY)
Admission: RE | Admit: 2018-08-26 | Discharge: 2018-08-27 | DRG: 470 | Disposition: A | Discharge status: Home Health | Payer: Medicare Other | Source: Ambulatory Visit | Attending: Orthopedic Surgery | Admitting: Orthopedic Surgery

## 2018-08-26 DIAGNOSIS — Z79899 Other long term (current) drug therapy: Secondary | ICD-10-CM | POA: Diagnosis not present

## 2018-08-26 DIAGNOSIS — M1612 Unilateral primary osteoarthritis, left hip: Secondary | ICD-10-CM | POA: Diagnosis present

## 2018-08-26 DIAGNOSIS — Z96641 Presence of right artificial hip joint: Secondary | ICD-10-CM | POA: Diagnosis present

## 2018-08-26 DIAGNOSIS — M1610 Unilateral primary osteoarthritis, unspecified hip: Secondary | ICD-10-CM

## 2018-08-26 DIAGNOSIS — E039 Hypothyroidism, unspecified: Secondary | ICD-10-CM | POA: Diagnosis present

## 2018-08-26 DIAGNOSIS — Z7982 Long term (current) use of aspirin: Secondary | ICD-10-CM

## 2018-08-26 DIAGNOSIS — I10 Essential (primary) hypertension: Secondary | ICD-10-CM | POA: Diagnosis present

## 2018-08-26 DIAGNOSIS — M25552 Pain in left hip: Secondary | ICD-10-CM

## 2018-08-26 HISTORY — DX: Unilateral primary osteoarthritis, unspecified hip: M16.10

## 2018-08-26 HISTORY — PX: TOTAL HIP ARTHROPLASTY: SHX124

## 2018-08-26 SURGERY — ARTHROPLASTY, HIP, TOTAL, ANTERIOR APPROACH
Anesthesia: Spinal | Site: Hip | Laterality: Left

## 2018-08-26 MED ORDER — DEXAMETHASONE SODIUM PHOSPHATE 10 MG/ML IJ SOLN
INTRAMUSCULAR | Status: DC | PRN
  Administered 2018-08-26: 10 mg via INTRAVENOUS

## 2018-08-26 MED ORDER — PROMETHAZINE HCL 25 MG/ML IJ SOLN: 6.2500 mg | INTRAMUSCULAR | Status: DC | PRN

## 2018-08-26 MED ORDER — 0.9 % SODIUM CHLORIDE (POUR BTL) OPTIME
TOPICAL | Status: DC | PRN
  Administered 2018-08-26: 1000 mL

## 2018-08-26 MED ORDER — SORBITOL 70 % SOLN
30.0000 mL | Freq: Every day | Status: DC | PRN
  Filled 2018-08-26: qty 30

## 2018-08-26 MED ORDER — MAGNESIUM CITRATE PO SOLN: 1.0000 | Freq: Once | ORAL | Status: DC | PRN

## 2018-08-26 MED ORDER — FENTANYL CITRATE (PF) 100 MCG/2ML IJ SOLN
INTRAMUSCULAR | Status: DC | PRN
  Administered 2018-08-26 (×2): 50 ug via INTRAVENOUS

## 2018-08-26 MED ORDER — LACTATED RINGERS IV SOLN
INTRAVENOUS | Status: DC
  Administered 2018-08-26: 08:00:00 via INTRAVENOUS
  Administered 2018-08-26: 1000 mL via INTRAVENOUS

## 2018-08-26 MED ORDER — ONDANSETRON HCL 4 MG PO TABS: 4.0000 mg | ORAL_TABLET | Freq: Three times a day (TID) | ORAL | 0 refills | Status: DC | PRN

## 2018-08-26 MED ORDER — DOCUSATE SODIUM 100 MG PO CAPS
100.0000 mg | ORAL_CAPSULE | Freq: Two times a day (BID) | ORAL | Status: DC
  Administered 2018-08-26 – 2018-08-27 (×2): 100 mg via ORAL
  Filled 2018-08-26 (×2): qty 1

## 2018-08-26 MED ORDER — METOCLOPRAMIDE HCL 5 MG/ML IJ SOLN: 5.0000 mg | Freq: Three times a day (TID) | INTRAMUSCULAR | Status: DC | PRN

## 2018-08-26 MED ORDER — ACETAMINOPHEN 500 MG PO TABS
1000.0000 mg | ORAL_TABLET | Freq: Once | ORAL | Status: AC
  Administered 2018-08-26: 1000 mg via ORAL
  Filled 2018-08-26: qty 2

## 2018-08-26 MED ORDER — EPHEDRINE SULFATE-NACL 50-0.9 MG/10ML-% IV SOSY
PREFILLED_SYRINGE | INTRAVENOUS | Status: DC | PRN
  Administered 2018-08-26 (×4): 10 mg via INTRAVENOUS

## 2018-08-26 MED ORDER — MORPHINE SULFATE (PF) 2 MG/ML IV SOLN
0.5000 mg | INTRAVENOUS | Status: DC | PRN
  Administered 2018-08-26: 1 mg via INTRAVENOUS
  Filled 2018-08-26: qty 1

## 2018-08-26 MED ORDER — DIPHENHYDRAMINE HCL 12.5 MG/5ML PO ELIX: 12.5000 mg | ORAL_SOLUTION | ORAL | Status: DC | PRN

## 2018-08-26 MED ORDER — HYDROCODONE-ACETAMINOPHEN 5-325 MG PO TABS
1.0000 | ORAL_TABLET | ORAL | Status: DC | PRN
  Administered 2018-08-26: 1 via ORAL
  Administered 2018-08-27 (×2): 2 via ORAL
  Filled 2018-08-26: qty 1
  Filled 2018-08-26 (×2): qty 2

## 2018-08-26 MED ORDER — BISOPROLOL-HYDROCHLOROTHIAZIDE 2.5-6.25 MG PO TABS
1.0000 | ORAL_TABLET | Freq: Every day | ORAL | Status: DC
  Filled 2018-08-26: qty 1

## 2018-08-26 MED ORDER — FENTANYL CITRATE (PF) 100 MCG/2ML IJ SOLN: 25.0000 ug | INTRAMUSCULAR | Status: DC | PRN

## 2018-08-26 MED ORDER — BUPIVACAINE IN DEXTROSE 0.75-8.25 % IT SOLN
INTRATHECAL | Status: DC | PRN
  Administered 2018-08-26: 2 mL via INTRATHECAL

## 2018-08-26 MED ORDER — ALPRAZOLAM 0.25 MG PO TABS: 0.2500 mg | ORAL_TABLET | Freq: Every evening | ORAL | Status: DC | PRN

## 2018-08-26 MED ORDER — BUPIVACAINE LIPOSOME 1.3 % IJ SUSP
INTRAMUSCULAR | Status: DC | PRN
  Administered 2018-08-26: 20 mL

## 2018-08-26 MED ORDER — GABAPENTIN 300 MG PO CAPS
300.0000 mg | ORAL_CAPSULE | Freq: Once | ORAL | Status: AC
  Administered 2018-08-26: 300 mg via ORAL
  Filled 2018-08-26: qty 1

## 2018-08-26 MED ORDER — SODIUM CHLORIDE FLUSH 0.9 % IV SOLN
INTRAVENOUS | Status: DC | PRN
  Administered 2018-08-26: 20 mL

## 2018-08-26 MED ORDER — ONDANSETRON HCL 4 MG/2ML IJ SOLN
INTRAMUSCULAR | Status: DC | PRN
  Administered 2018-08-26: 4 mg via INTRAVENOUS

## 2018-08-26 MED ORDER — BISOPROLOL-HYDROCHLOROTHIAZIDE 2.5-6.25 MG PO TABS
1.0000 | ORAL_TABLET | Freq: Every day | ORAL | Status: DC
  Administered 2018-08-27: 1 via ORAL
  Filled 2018-08-26: qty 1

## 2018-08-26 MED ORDER — PROPOFOL 10 MG/ML IV BOLUS
INTRAVENOUS | Status: DC | PRN
  Administered 2018-08-26 (×2): 20 mg via INTRAVENOUS

## 2018-08-26 MED ORDER — EPHEDRINE 5 MG/ML INJ
INTRAVENOUS | Status: AC
  Filled 2018-08-26: qty 10

## 2018-08-26 MED ORDER — METHOCARBAMOL 500 MG IVPB - SIMPLE MED
500.0000 mg | Freq: Four times a day (QID) | INTRAVENOUS | Status: DC | PRN
  Filled 2018-08-26: qty 50

## 2018-08-26 MED ORDER — ONDANSETRON HCL 4 MG/2ML IJ SOLN
INTRAMUSCULAR | Status: AC
  Filled 2018-08-26: qty 2

## 2018-08-26 MED ORDER — ASPIRIN EC 81 MG PO TBEC: 81.0000 mg | DELAYED_RELEASE_TABLET | Freq: Two times a day (BID) | ORAL | 0 refills | Status: DC

## 2018-08-26 MED ORDER — TRANEXAMIC ACID-NACL 1000-0.7 MG/100ML-% IV SOLN
1000.0000 mg | INTRAVENOUS | Status: AC
  Administered 2018-08-26: 1000 mg via INTRAVENOUS
  Filled 2018-08-26: qty 100

## 2018-08-26 MED ORDER — HYDROCODONE-ACETAMINOPHEN 5-325 MG PO TABS: 1.0000 | ORAL_TABLET | Freq: Four times a day (QID) | ORAL | 0 refills | Status: DC | PRN

## 2018-08-26 MED ORDER — DEXAMETHASONE SODIUM PHOSPHATE 10 MG/ML IJ SOLN
10.0000 mg | Freq: Once | INTRAMUSCULAR | Status: AC
  Administered 2018-08-27: 10 mg via INTRAVENOUS
  Filled 2018-08-26: qty 1

## 2018-08-26 MED ORDER — ACETAMINOPHEN 500 MG PO TABS
500.0000 mg | ORAL_TABLET | Freq: Four times a day (QID) | ORAL | Status: DC
  Administered 2018-08-26 – 2018-08-27 (×3): 500 mg via ORAL
  Filled 2018-08-26 (×3): qty 1

## 2018-08-26 MED ORDER — LIDOCAINE HCL (CARDIAC) PF 100 MG/5ML IV SOSY
PREFILLED_SYRINGE | INTRAVENOUS | Status: DC | PRN
  Administered 2018-08-26: 60 mg via INTRAVENOUS

## 2018-08-26 MED ORDER — MENTHOL 3 MG MT LOZG: 1.0000 | LOZENGE | OROMUCOSAL | Status: DC | PRN

## 2018-08-26 MED ORDER — METHOCARBAMOL 500 MG PO TABS: 500.0000 mg | ORAL_TABLET | Freq: Three times a day (TID) | ORAL | 0 refills | Status: DC | PRN

## 2018-08-26 MED ORDER — PROPOFOL 500 MG/50ML IV EMUL
INTRAVENOUS | Status: DC | PRN
  Administered 2018-08-26: 75 ug/kg/min via INTRAVENOUS

## 2018-08-26 MED ORDER — GABAPENTIN 300 MG PO CAPS: 300.0000 mg | ORAL_CAPSULE | Freq: Two times a day (BID) | ORAL | 0 refills | Status: DC

## 2018-08-26 MED ORDER — STERILE WATER FOR IRRIGATION IR SOLN
Status: DC | PRN
  Administered 2018-08-26: 2000 mL

## 2018-08-26 MED ORDER — FENTANYL CITRATE (PF) 100 MCG/2ML IJ SOLN
INTRAMUSCULAR | Status: AC
  Filled 2018-08-26: qty 2

## 2018-08-26 MED ORDER — POLYETHYLENE GLYCOL 3350 17 G PO PACK: 17.0000 g | PACK | Freq: Every day | ORAL | Status: DC | PRN

## 2018-08-26 MED ORDER — GABAPENTIN 300 MG PO CAPS
300.0000 mg | ORAL_CAPSULE | Freq: Three times a day (TID) | ORAL | Status: DC
  Administered 2018-08-26 – 2018-08-27 (×3): 300 mg via ORAL
  Filled 2018-08-26 (×3): qty 1

## 2018-08-26 MED ORDER — OXYCODONE HCL 5 MG/5ML PO SOLN
5.0000 mg | Freq: Once | ORAL | Status: DC | PRN
  Filled 2018-08-26: qty 5

## 2018-08-26 MED ORDER — LEVOTHYROXINE SODIUM 25 MCG PO TABS
25.0000 ug | ORAL_TABLET | Freq: Every day | ORAL | Status: DC
  Administered 2018-08-27: 25 ug via ORAL
  Filled 2018-08-26: qty 1

## 2018-08-26 MED ORDER — PROPOFOL 10 MG/ML IV BOLUS
INTRAVENOUS | Status: AC
  Filled 2018-08-26: qty 80

## 2018-08-26 MED ORDER — HYDROMORPHONE HCL 1 MG/ML IJ SOLN: 0.2500 mg | INTRAMUSCULAR | Status: DC | PRN

## 2018-08-26 MED ORDER — ACETAMINOPHEN 325 MG PO TABS: 325.0000 mg | ORAL_TABLET | Freq: Four times a day (QID) | ORAL | Status: DC | PRN

## 2018-08-26 MED ORDER — PHENYLEPHRINE 40 MCG/ML (10ML) SYRINGE FOR IV PUSH (FOR BLOOD PRESSURE SUPPORT)
PREFILLED_SYRINGE | INTRAVENOUS | Status: DC | PRN
  Administered 2018-08-26 (×15): 80 ug via INTRAVENOUS

## 2018-08-26 MED ORDER — OXYCODONE HCL 5 MG PO TABS: 5.0000 mg | ORAL_TABLET | Freq: Once | ORAL | Status: DC | PRN

## 2018-08-26 MED ORDER — LACTATED RINGERS IV SOLN
INTRAVENOUS | Status: DC
  Administered 2018-08-26 (×2): via INTRAVENOUS

## 2018-08-26 MED ORDER — ALLOPURINOL 300 MG PO TABS
300.0000 mg | ORAL_TABLET | Freq: Every day | ORAL | Status: DC
  Administered 2018-08-27: 300 mg via ORAL
  Filled 2018-08-26: qty 1

## 2018-08-26 MED ORDER — LIDOCAINE 2% (20 MG/ML) 5 ML SYRINGE
INTRAMUSCULAR | Status: AC
  Filled 2018-08-26: qty 5

## 2018-08-26 MED ORDER — PROPOFOL 10 MG/ML IV BOLUS
INTRAVENOUS | Status: AC
  Filled 2018-08-26: qty 20

## 2018-08-26 MED ORDER — ASPIRIN 81 MG PO CHEW
81.0000 mg | CHEWABLE_TABLET | Freq: Two times a day (BID) | ORAL | Status: DC
  Administered 2018-08-26 – 2018-08-27 (×2): 81 mg via ORAL
  Filled 2018-08-26 (×2): qty 1

## 2018-08-26 MED ORDER — PHENOL 1.4 % MT LIQD: 1.0000 | OROMUCOSAL | Status: DC | PRN

## 2018-08-26 MED ORDER — CELECOXIB 200 MG PO CAPS
200.0000 mg | ORAL_CAPSULE | Freq: Two times a day (BID) | ORAL | Status: DC
  Administered 2018-08-26 – 2018-08-27 (×3): 200 mg via ORAL
  Filled 2018-08-26 (×3): qty 1

## 2018-08-26 MED ORDER — CEFAZOLIN SODIUM-DEXTROSE 2-4 GM/100ML-% IV SOLN
2.0000 g | INTRAVENOUS | Status: AC
  Administered 2018-08-26: 2 g via INTRAVENOUS
  Filled 2018-08-26: qty 100

## 2018-08-26 MED ORDER — MEPERIDINE HCL 50 MG/ML IJ SOLN: 6.2500 mg | INTRAMUSCULAR | Status: DC | PRN

## 2018-08-26 MED ORDER — ONDANSETRON HCL 4 MG PO TABS: 4.0000 mg | ORAL_TABLET | Freq: Four times a day (QID) | ORAL | Status: DC | PRN

## 2018-08-26 MED ORDER — METHOCARBAMOL 500 MG PO TABS
500.0000 mg | ORAL_TABLET | Freq: Four times a day (QID) | ORAL | Status: DC | PRN
  Administered 2018-08-26 – 2018-08-27 (×2): 500 mg via ORAL
  Filled 2018-08-26 (×2): qty 1

## 2018-08-26 MED ORDER — ONDANSETRON HCL 4 MG/2ML IJ SOLN: 4.0000 mg | Freq: Four times a day (QID) | INTRAMUSCULAR | Status: DC | PRN

## 2018-08-26 MED ORDER — CEFAZOLIN SODIUM-DEXTROSE 1-4 GM/50ML-% IV SOLN
1.0000 g | Freq: Four times a day (QID) | INTRAVENOUS | Status: AC
  Administered 2018-08-26 (×2): 1 g via INTRAVENOUS
  Filled 2018-08-26 (×2): qty 50

## 2018-08-26 MED ORDER — DOCUSATE SODIUM 100 MG PO CAPS: 100.0000 mg | ORAL_CAPSULE | Freq: Two times a day (BID) | ORAL | 0 refills | Status: DC

## 2018-08-26 MED ORDER — DEXAMETHASONE SODIUM PHOSPHATE 10 MG/ML IJ SOLN
INTRAMUSCULAR | Status: AC
  Filled 2018-08-26: qty 1

## 2018-08-26 MED ORDER — METOCLOPRAMIDE HCL 5 MG PO TABS: 5.0000 mg | ORAL_TABLET | Freq: Three times a day (TID) | ORAL | Status: DC | PRN

## 2018-08-26 MED ORDER — CHLORHEXIDINE GLUCONATE 4 % EX LIQD: 60.0000 mL | Freq: Once | CUTANEOUS | Status: DC

## 2018-08-26 MED ORDER — SODIUM CHLORIDE 0.9 % IJ SOLN
INTRAMUSCULAR | Status: AC
  Filled 2018-08-26: qty 20

## 2018-08-26 MED ORDER — PHENYLEPHRINE 40 MCG/ML (10ML) SYRINGE FOR IV PUSH (FOR BLOOD PRESSURE SUPPORT)
PREFILLED_SYRINGE | INTRAVENOUS | Status: AC
  Filled 2018-08-26: qty 10

## 2018-08-26 MED ORDER — CELECOXIB 100 MG PO CAPS: 100.0000 mg | ORAL_CAPSULE | Freq: Two times a day (BID) | ORAL | 0 refills | Status: AC

## 2018-08-26 MED ORDER — HYDROCORTISONE 2.5 % RE CREA: 1.0000 "application " | TOPICAL_CREAM | Freq: Every day | RECTAL | Status: DC | PRN

## 2018-08-26 MED ORDER — BISOPROLOL FUMARATE 5 MG PO TABS
2.5000 mg | ORAL_TABLET | Freq: Once | ORAL | Status: AC
  Administered 2018-08-26: 2.5 mg via ORAL
  Filled 2018-08-26: qty 0.5

## 2018-08-26 SURGICAL SUPPLY — 39 items
BLADE SAG 18X100X1.27 (BLADE) IMPLANT
BLADE SAGITTAL 25.0X1.19X90 (BLADE) ×2 IMPLANT
BLADE SAGITTAL 25.0X1.19X90MM (BLADE) ×1
CLOSURE WOUND 1/2 X4 (GAUZE/BANDAGES/DRESSINGS) ×1
COVER PERINEAL POST (MISCELLANEOUS) ×3 IMPLANT
COVER SURGICAL LIGHT HANDLE (MISCELLANEOUS) ×3 IMPLANT
COVER WAND RF STERILE (DRAPES) IMPLANT
DECANTER SPIKE VIAL GLASS SM (MISCELLANEOUS) IMPLANT
DRAPE IMP U-DRAPE 54X76 (DRAPES) ×3 IMPLANT
DRAPE STERI IOBAN 125X83 (DRAPES) ×3 IMPLANT
DRAPE U-SHAPE 47X51 STRL (DRAPES) ×6 IMPLANT
DRSG MEPILEX BORDER 4X8 (GAUZE/BANDAGES/DRESSINGS) ×3 IMPLANT
DURAPREP 26ML APPLICATOR (WOUND CARE) ×3 IMPLANT
GLOVE BIO SURGEON STRL SZ7.5 (GLOVE) ×6 IMPLANT
GLOVE BIOGEL PI IND STRL 8 (GLOVE) ×2 IMPLANT
GLOVE BIOGEL PI INDICATOR 8 (GLOVE) ×4
GOWN STRL REUS W/TWL LRG LVL3 (GOWN DISPOSABLE) ×3 IMPLANT
GOWN STRL REUS W/TWL XL LVL3 (GOWN DISPOSABLE) ×3 IMPLANT
HEAD BIOLOX HIP 36/-5 (Joint) ×1 IMPLANT
HIP BIOLOX HD 36/-5 (Joint) ×3 IMPLANT
INSERT TRIDENT POLY 36MM 0DEG (Insert) ×3 IMPLANT
MANIFOLD NEPTUNE II (INSTRUMENTS) ×3 IMPLANT
NS IRRIG 1000ML POUR BTL (IV SOLUTION) ×3 IMPLANT
PACK ANTERIOR HIP CUSTOM (KITS) ×3 IMPLANT
POSITIONER SURGICAL ARM (MISCELLANEOUS) ×3 IMPLANT
SCREW HEX LP 6.5X20 (Screw) ×3 IMPLANT
SHELL CLUSTERHOLE ACETABULAR 5 (Shell) ×3 IMPLANT
STEM ACCOLADE SZ 6 (Hips) ×3 IMPLANT
STRIP CLOSURE SKIN 1/2X4 (GAUZE/BANDAGES/DRESSINGS) ×2 IMPLANT
SUT MNCRL AB 4-0 PS2 18 (SUTURE) ×3 IMPLANT
SUT STRATAFIX 0 PDS 27 VIOLET (SUTURE) ×3
SUT VIC AB 0 CT1 36 (SUTURE) ×3 IMPLANT
SUT VIC AB 1 CT1 36 (SUTURE) ×3 IMPLANT
SUT VIC AB 2-0 CT1 27 (SUTURE) ×4
SUT VIC AB 2-0 CT1 TAPERPNT 27 (SUTURE) ×2 IMPLANT
SUTURE STRATFX 0 PDS 27 VIOLET (SUTURE) ×1 IMPLANT
TRAY FOLEY MTR SLVR 16FR STAT (SET/KITS/TRAYS/PACK) ×3 IMPLANT
WATER STERILE IRR 1000ML POUR (IV SOLUTION) ×6 IMPLANT
YANKAUER SUCT BULB TIP 10FT TU (MISCELLANEOUS) ×3 IMPLANT

## 2018-08-26 NOTE — Discharge Instructions (Signed)
You may bear weight as tolerated. °Keep your dressing on and dry until follow up. °Take medicine to prevent blood clots as directed. °Take pain medicine as needed with the goal of transitioning to over the counter medicines.  °If needed, you may increase pain medication for the first few days post op - up to 2 tablets every 4 hours.  Stop as needed pain medication as soon as you are able. ° °INSTRUCTIONS AFTER JOINT REPLACEMENT  ° °o Remove items at home which could result in a fall. This includes throw rugs or furniture in walking pathways °o ICE to the affected joint every three hours while awake for 30 minutes at a time, for at least the first 3-5 days, and then as needed for pain and swelling.  Continue to use ice for pain and swelling. You may notice swelling that will progress down to the foot and ankle.  This is normal after surgery.  Elevate your leg when you are not up walking on it.   °o Continue to use the breathing machine you got in the hospital (incentive spirometer) which will help keep your temperature down.  It is common for your temperature to cycle up and down following surgery, especially at night when you are not up moving around and exerting yourself.  The breathing machine keeps your lungs expanded and your temperature down. ° ° °DIET:  As you were doing prior to hospitalization, we recommend a well-balanced diet. ° °DRESSING / WOUND CARE / SHOWERING ° °You may shower 3 days after surgery, but keep the wounds dry during showering.  You may use an occlusive plastic wrap (Press'n Seal for example) with blue painter's tape at edges, NO SOAKING/SUBMERGING IN THE BATHTUB.  If the bandage gets wet, change with a clean dry gauze.  If the incision gets wet, pat the wound dry with a clean towel. ° °ACTIVITY ° °o Increase activity slowly as tolerated, but follow the weight bearing instructions below.   °o No driving for 6 weeks or until further direction given by your physician.  You cannot drive while  taking narcotics.  °o No lifting or carrying greater than 10 lbs. until further directed by your surgeon. °o Avoid periods of inactivity such as sitting longer than an hour when not asleep. This helps prevent blood clots.  °o You may return to work once you are authorized by your doctor.  ° ° ° °WEIGHT BEARING  ° °Weight bearing as tolerated with assist device (walker, cane, etc) as directed, use it as long as suggested by your surgeon or therapist, typically at least 4-6 weeks. ° ° °EXERCISES ° °Results after joint replacement surgery are often greatly improved when you follow the exercise, range of motion and muscle strengthening exercises prescribed by your doctor. Safety measures are also important to protect the joint from further injury. Any time any of these exercises cause you to have increased pain or swelling, decrease what you are doing until you are comfortable again and then slowly increase them. If you have problems or questions, call your caregiver or physical therapist for advice.  ° °Rehabilitation is important following a joint replacement. After just a few days of immobilization, the muscles of the leg can become weakened and shrink (atrophy).  These exercises are designed to build up the tone and strength of the thigh and leg muscles and to improve motion. Often times heat used for twenty to thirty minutes before working out will loosen up your tissues and help with   improving the range of motion but do not use heat for the first two weeks following surgery (sometimes heat can increase post-operative swelling).  ° °These exercises can be done on a training (exercise) mat, on the floor, on a table or on a bed. Use whatever works the best and is most comfortable for you.    Use music or television while you are exercising so that the exercises are a pleasant break in your day. This will make your life better with the exercises acting as a break in your routine that you can look forward to.   Perform  all exercises about fifteen times, three times per day or as directed.  You should exercise both the operative leg and the other leg as well. ° °Exercises include: °  °• Quad Sets - Tighten up the muscle on the front of the thigh (Quad) and hold for 5-10 seconds.   °• Straight Leg Raises - With your knee straight (if you were given a brace, keep it on), lift the leg to 60 degrees, hold for 3 seconds, and slowly lower the leg.  Perform this exercise against resistance later as your leg gets stronger.  °• Leg Slides: Lying on your back, slowly slide your foot toward your buttocks, bending your knee up off the floor (only go as far as is comfortable). Then slowly slide your foot back down until your leg is flat on the floor again.  °• Angel Wings: Lying on your back spread your legs to the side as far apart as you can without causing discomfort.  °• Hamstring Strength:  Lying on your back, push your heel against the floor with your leg straight by tightening up the muscles of your buttocks.  Repeat, but this time bend your knee to a comfortable angle, and push your heel against the floor.  You may put a pillow under the heel to make it more comfortable if necessary.  ° °A rehabilitation program following joint replacement surgery can speed recovery and prevent re-injury in the future due to weakened muscles. Contact your doctor or a physical therapist for more information on knee rehabilitation.  ° ° °CONSTIPATION ° °Constipation is defined medically as fewer than three stools per week and severe constipation as less than one stool per week.  Even if you have a regular bowel pattern at home, your normal regimen is likely to be disrupted due to multiple reasons following surgery.  Combination of anesthesia, postoperative narcotics, change in appetite and fluid intake all can affect your bowels.  ° °YOU MUST use at least one of the following options; they are listed in order of increasing strength to get the job done.   They are all available over the counter, and you may need to use some, POSSIBLY even all of these options:   ° °Drink plenty of fluids (prune juice may be helpful) and high fiber foods °Colace 100 mg by mouth twice a day  °Senokot for constipation as directed and as needed Dulcolax (bisacodyl), take with full glass of water  °Miralax (polyethylene glycol) once or twice a day as needed. ° °If you have tried all these things and are unable to have a bowel movement in the first 3-4 days after surgery call either your surgeon or your primary doctor.   ° °If you experience loose stools or diarrhea, hold the medications until you stool forms back up.  If your symptoms do not get better within 1 week or if they get worse,   check with your doctor.  If you experience "the worst abdominal pain ever" or develop nausea or vomiting, please contact the office immediately for further recommendations for treatment. ° ° °ITCHING:  If you experience itching with your medications, try taking only a single pain pill, or even half a pain pill at a time.  You can also use Benadryl over the counter for itching or also to help with sleep.  ° °TED HOSE STOCKINGS:  Use stockings on both legs until for at least 2 weeks or as directed by physician office. They may be removed at night for sleeping. ° °MEDICATIONS:  See your medication summary on the “After Visit Summary” that nursing will review with you.  You may have some home medications which will be placed on hold until you complete the course of blood thinner medication.  It is important for you to complete the blood thinner medication as prescribed. ° °PRECAUTIONS:  If you experience chest pain or shortness of breath - call 911 immediately for transfer to the hospital emergency department.  ° °If you develop a fever greater that 101 F, purulent drainage from wound, increased redness or drainage from wound, foul odor from the wound/dressing, or calf pain - CONTACT YOUR SURGEON.   °                                                 °FOLLOW-UP APPOINTMENTS:  If you do not already have a post-op appointment, please call the office for an appointment to be seen by your surgeon.  Guidelines for how soon to be seen are listed in your “After Visit Summary”, but are typically between 1-4 weeks after surgery. ° °OTHER INSTRUCTIONS:  ° ° ° °MAKE SURE YOU:  °• Understand these instructions.  °• Get help right away if you are not doing well or get worse.  ° ° °Thank you for letting us be a part of your medical care team.  It is a privilege we respect greatly.  We hope these instructions will help you stay on track for a fast and full recovery!  ° ° ° ° °

## 2018-08-26 NOTE — Anesthesia Postprocedure Evaluation (Signed)
Anesthesia Post Note  Patient: Erik Odonnell  Procedure(s) Performed: LEFT TOTAL HIP ARTHROPLASTY ANTERIOR APPROACH (Left Hip)     Patient location during evaluation: PACU Anesthesia Type: Spinal Level of consciousness: awake and alert Pain management: pain level controlled Vital Signs Assessment: post-procedure vital signs reviewed and stable Respiratory status: spontaneous breathing and respiratory function stable Cardiovascular status: blood pressure returned to baseline and stable Postop Assessment: spinal receding Anesthetic complications: no    Last Vitals:  Vitals:   08/26/18 1053 08/26/18 1200  BP: 111/75 118/82  Pulse: 67 85  Resp: 14 16  Temp: 36.4 C 36.6 C  SpO2: 96% 98%    Last Pain:  Vitals:   08/26/18 1200  TempSrc: Oral  PainSc:     LLE Motor Response: Purposeful movement(can rock knees) (08/26/18 1152) LLE Sensation: Numbness (08/26/18 1152) RLE Motor Response: Purposeful movement(can rock knees) (08/26/18 1152) RLE Sensation: Numbness (08/26/18 1152) L Sensory Level: L5-Outer lower leg, top of foot, great toe (08/26/18 1152) R Sensory Level: L5-Outer lower leg, top of foot, great toe (08/26/18 1152)  Nolon Nations

## 2018-08-26 NOTE — Evaluation (Signed)
Physical Therapy Evaluation Patient Details Name: Erik Odonnell MRN: [id number] DOB: 23-Jan-1946 Today's Date: 08/26/2018   History of Present Illness  Patient is a 72 y/o male admitted for L direct anterior THA.  Previous R THA 2015, HTN, hyperlipidemia.  Clinical Impression  Patient presents with decreased mobility due to pain, decreased ROM and strength L LE.  Feel he will benefit from skilled PT in the acute setting to allow return home with spouse assist and follow up HHPT.    Follow Up Recommendations Follow surgeon's recommendation for DC plan and follow-up therapies    Equipment Recommendations  None recommended by PT    Recommendations for Other Services       Precautions / Restrictions Precautions Precautions: Fall Restrictions Other Position/Activity Restrictions: WBAT      Mobility  Bed Mobility Overal bed mobility: Needs Assistance Bed Mobility: Supine to Sit     Supine to sit: HOB elevated;Min assist     General bed mobility comments: to lift trunk and guide L leg off bed  Transfers Overall transfer level: Needs assistance Equipment used: Rolling walker (2 wheeled) Transfers: Sit to/from Stand Sit to Stand: From elevated surface;Min guard         General transfer comment: simulated bed height at home  Ambulation/Gait Ambulation/Gait assistance: Min guard Gait Distance (Feet): 45 Feet Assistive device: Rolling walker (2 wheeled) Gait Pattern/deviations: Step-to pattern;Step-through pattern;Decreased stride length     General Gait Details: cues for sequence  Stairs            Wheelchair Mobility    Modified Rankin (Stroke Patients Only)       Balance Overall balance assessment: Mild deficits observed, not formally tested                                           Pertinent Vitals/Pain Pain Assessment: 0-10 Pain Score: 3  Pain Location: L hip Pain Descriptors / Indicators: Operative site guarding Pain  Intervention(s): Repositioned;Monitored during session    Home Living Family/patient expects to be discharged to:: Private residence Living Arrangements: Spouse/significant other Available Help at Discharge: Family Type of Home: House Home Access: Stairs to enter Entrance Stairs-Rails: Left Entrance Stairs-Number of Steps: 4 Home Layout: Multi-level;Able to live on main level with bedroom/bathroom Home Equipment: Gilford Rile - 2 wheels;Adaptive equipment;Shower seat;Toilet riser      Prior Function Level of Independence: Independent         Comments: picks up wood for wood heat     Hand Dominance        Extremity/Trunk Assessment        Lower Extremity Assessment Lower Extremity Assessment: LLE deficits/detail LLE Deficits / Details: AAROM hip flexion about 50 in supine, quad strength at least 3/5, still some tingling in feet       Communication   Communication: No difficulties  Cognition Arousal/Alertness: Awake/alert Behavior During Therapy: WFL for tasks assessed/performed Overall Cognitive Status: Within Functional Limits for tasks assessed                                        General Comments      Exercises Total Joint Exercises Ankle Circles/Pumps: AROM;10 reps;Both;Supine Quad Sets: AROM;10 reps;Left;Supine Heel Slides: AAROM;5 reps;Left;Supine   Assessment/Plan    PT Assessment Patient needs continued PT  services  PT Problem List Decreased strength;Decreased mobility;Pain;Decreased knowledge of use of DME;Decreased range of motion       PT Treatment Interventions Therapeutic activities;DME instruction;Gait training;Therapeutic exercise;Patient/family education;Functional mobility training;Stair training    PT Goals (Current goals can be found in the Care Plan section)  Acute Rehab PT Goals Patient Stated Goal: to go home PT Goal Formulation: With patient Time For Goal Achievement: 08/29/18 Potential to Achieve Goals: Good     Frequency 7X/week   Barriers to discharge        Co-evaluation               AM-PAC PT "6 Clicks" Daily Activity  Outcome Measure Difficulty turning over in bed (including adjusting bedclothes, sheets and blankets)?: Unable Difficulty moving from lying on back to sitting on the side of the bed? : Unable Difficulty sitting down on and standing up from a chair with arms (e.g., wheelchair, bedside commode, etc,.)?: Unable Help needed moving to and from a bed to chair (including a wheelchair)?: A Little Help needed walking in hospital room?: A Little Help needed climbing 3-5 steps with a railing? : A Little 6 Click Score: 12    End of Session Equipment Utilized During Treatment: Gait belt Activity Tolerance: Patient tolerated treatment well Patient left: with call bell/phone within reach;in chair;with chair alarm set Nurse Communication: Mobility status PT Visit Diagnosis: Difficulty in walking, not elsewhere classified (R26.2);Pain Pain - Right/Left: Left Pain - part of body: Hip    Time: 1423-9532 PT Time Calculation (min) (ACUTE ONLY): 25 min   Charges:   PT Evaluation $PT Eval Low Complexity: 1 Low PT Treatments $Gait Training: 8-22 mins        Magda Kiel, [place] [facility] [phone] 08/26/2018   Reginia Naas 08/26/2018, 3:57 PM

## 2018-08-26 NOTE — Plan of Care (Signed)

## 2018-08-26 NOTE — Op Note (Signed)
08/26/2018  9:16 AM  PATIENT:  Erik Odonnell   MRN: [id number]  PRE-OPERATIVE DIAGNOSIS:  OA LEFT HIP  POST-OPERATIVE DIAGNOSIS:  Osteoarthritis left hip  PROCEDURE:  Procedure(s): LEFT TOTAL HIP ARTHROPLASTY ANTERIOR APPROACH  PREOPERATIVE INDICATIONS:    Erik Odonnell is an 72 y.o. male who has a diagnosis of Primary osteoarthritis of left hip and elected for surgical management after failing conservative treatment.  The risks benefits and alternatives were discussed with the patient including but not limited to the risks of nonoperative treatment, versus surgical intervention including infection, bleeding, nerve injury, periprosthetic fracture, the need for revision surgery, dislocation, leg length discrepancy, blood clots, cardiopulmonary complications, morbidity, mortality, among others, and they were willing to proceed.     OPERATIVE REPORT     SURGEON:   Renette Butters, MD    ASSISTANT:  Roxan Hockey, PA-C, he was present and scrubbed throughout the case, critical for completion in a timely fashion, and for retraction, instrumentation, and closure.     ANESTHESIA:  General    COMPLICATIONS:  None.     COMPONENTS:  Stryker acolade fit femur size 6 with a 36 mm -5 head ball and a PSL acetabular shell size 52 with a  polyethylene liner    PROCEDURE IN DETAIL:   The patient was met in the holding area and  identified.  The appropriate hip was identified and marked at the operative site.  The patient was then transported to the OR  and  placed under anesthesia per that record.  At that point, the patient was  placed in the supine position and  secured to the operating room table and all bony prominences padded. He received pre-operative antibiotics    The operative lower extremity was prepped from the iliac crest to the distal leg.  Sterile draping was performed.  Time out was performed prior to incision.      Skin incision was made just 2 cm lateral to the ASIS   extending in line with the tensor fascia lata. Electrocautery was used to control all bleeders. I dissected down sharply to the fascia of the tensor fascia lata was confirmed that the muscle fibers beneath were running posteriorly. I then incised the fascia over the superficial tensor fascia lata in line with the incision. The fascia was elevated off the anterior aspect of the muscle the muscle was retracted posteriorly and protected throughout the case. I then used electrocautery to incise the tensor fascia lata fascia control and all bleeders. Immediately visible was the fat over top of the anterior neck and capsule.  I removed the anterior fat from the capsule and elevated the rectus muscle off of the anterior capsule. I then removed a large time of capsule. The retractors were then placed over the anterior acetabulum as well as around the superior and inferior neck.  I then removed a section of the femoral neck and a napkin ring fashion. Then used the power course to remove the femoral head from the acetabulum and thoroughly irrigated the acetabulum. I sized the femoral head.    I then exposed the deep acetabulum, cleared out any tissue including the ligamentum teres.   After adequate visualization, I excised the labrum, and then sequentially reamed.  I then impacted the acetabular implant into place using fluoroscopy for guidance.  Appropriate version and inclination was confirmed clinically matching their bony anatomy, and with fluoroscopy.  I placed a 20 mm screw in the posterior/superio position with an excellent bite.  I then placed the polyethylene liner in place  I then adducted the leg and released the external rotators from the posterior femur allowing it to be easily delivered up lateral and anterior to the acetabulum for preparation of the femoral canal.    I then prepared the proximal femur using the cookie-cutter and then sequentially reamed and broached.  A trial broach, neck, and  head was utilized, and I reduced the hip and used floroscopy to assess the neck length and femoral implant.  I then impacted the femoral prosthesis into place into the appropriate version. The hip was then reduced and fluoroscopy confirmed appropriate position. Leg lengths were restored.  I then irrigated the hip copiously again with, and repaired the fascia with Vicryl, followed by monocryl for the subcutaneous tissue, Monocryl for the skin, Steri-Strips and sterile gauze. [name] in stable and satisfactory condition. There were no complications.  POST OPERATIVE PLAN: WBAT, DVT px: SCD's/TED, ambulation and chemical dvt px  Erik Alesi, MD Orthopedic Surgeon [phone]     

## 2018-08-26 NOTE — Anesthesia Procedure Notes (Signed)
Spinal  Patient location during procedure: OR End time: 08/26/2018 7:28 AM Staffing Resident/CRNA: Caryl Pina T, CRNA Performed: resident/CRNA  Preanesthetic Checklist Completed: patient identified, site marked, surgical consent, pre-op evaluation, timeout performed, IV checked, risks and benefits discussed and monitors and equipment checked Spinal Block Patient position: sitting Prep: DuraPrep Patient monitoring: heart rate, continuous pulse ox and blood pressure Approach: midline Location: L3-4 Injection technique: single-shot Needle Needle type: Pencan  Needle gauge: 24 G Needle length: 9 cm Assessment Sensory level: T6 Additional Notes Expiration date of kit checked and confirmed. Patient tolerated procedure well, without complications.

## 2018-08-26 NOTE — Interval H&P Note (Signed)
I participated in the care of this patient and agree with the above history, physical and evaluation. I performed a review of the history and a physical exam as detailed   Lexis Potenza Daniel Acacia Latorre MD  

## 2018-08-26 NOTE — Transfer of Care (Signed)
Immediate Anesthesia Transfer of Care Note  Patient: Erik Odonnell  Procedure(s) Performed: LEFT TOTAL HIP ARTHROPLASTY ANTERIOR APPROACH (Left Hip)  Patient Location: PACU  Anesthesia Type:Spinal  Level of Consciousness: awake, alert , oriented and patient cooperative  Airway & Oxygen Therapy: Patient Spontanous Breathing and Patient connected to face mask oxygen  Post-op Assessment: Report given to RN, Post -op Vital signs reviewed and stable and Patient moving all extremities  Post vital signs: Reviewed and stable  Last Vitals:  Vitals Value Taken Time  BP 106/72 08/26/2018  9:38 AM  Temp    Pulse 91 08/26/2018  9:41 AM  Resp 18 08/26/2018  9:41 AM  SpO2 95 % 08/26/2018  9:41 AM  Vitals shown include unvalidated device data.  Last Pain:  Vitals:   08/26/18 0646  TempSrc:   PainSc: 0-No pain      Patients Stated Pain Goal: 4 (29/93/71 6967)  Complications: No apparent anesthesia complications

## 2018-08-27 NOTE — Discharge Summary (Signed)
Discharge Summary  Patient ID: Erik Odonnell MRN: [id number] DOB/AGE: 1946/10/11 72 y.o.  Admit date: 08/26/2018 Discharge date: 08/27/2018  Admission Diagnoses:  Primary osteoarthritis of left hip  Discharge Diagnoses:  Principal Problem:   Primary osteoarthritis of left hip Active Problems:   Primary osteoarthritis of hip   Past Medical History:  Diagnosis Date  . Arthritis   . Diverticulosis   . Hemifacial spasm    left side affected takes Artane daily;   . History of bronchitis 11/2013  . Hypertension    takes Ziac daily  . Joint pain     Surgeries: Procedure(s): LEFT TOTAL HIP ARTHROPLASTY ANTERIOR APPROACH on 08/26/2018   Consultants (if any):   Discharged Condition: Improved  Hospital Course: IMAAD REUSS is an 72 y.o. male who was admitted 08/26/2018 with a diagnosis of Primary osteoarthritis of left hip and went to the operating room on 08/26/2018 and underwent the above named procedures.    He was given perioperative antibiotics:  Anti-infectives (From admission, onward)   Start     Dose/Rate Route Frequency Ordered Stop   08/26/18 1400  ceFAZolin (ANCEF) IVPB 1 g/50 mL premix     1 g 100 mL/hr over 30 Minutes Intravenous Every 6 hours 08/26/18 1054 08/26/18 2103   08/26/18 0600  ceFAZolin (ANCEF) IVPB 2g/100 mL premix     2 g 200 mL/hr over 30 Minutes Intravenous On call to O.R. 08/26/18 4854 08/26/18 0729    .  He was given sequential compression devices, early ambulation, and aspirin for DVT prophylaxis.  He benefited maximally from the hospital stay and there were no complications.    Recent vital signs:  Vitals:   08/27/18 0205 08/27/18 0603  BP: 136/75 133/74  Pulse: 71 60  Resp:  15  Temp: 97.6 F (36.4 C) 97.8 F (36.6 C)  SpO2: 93% 94%    Recent laboratory studies:  Lab Results  Component Value Date   HGB 16.3 08/18/2018   HGB 13.0 03/25/2014   HGB 16.0 03/17/2014   Lab Results  Component Value Date   WBC 6.9 08/18/2018   PLT  161 08/18/2018   Lab Results  Component Value Date   INR 0.98 03/17/2014   Lab Results  Component Value Date   NA 135 08/18/2018   K 4.2 08/18/2018   CL 100 08/18/2018   CO2 26 08/18/2018   BUN 29 (H) 08/18/2018   CREATININE 1.23 08/18/2018   GLUCOSE 107 (H) 08/18/2018    Discharge Medications:   Allergies as of 08/27/2018   No Known Allergies     Medication List    STOP taking these medications   amoxicillin 500 MG capsule Commonly known as:  AMOXIL   polyethylene glycol-electrolytes 420 g solution Commonly known as:  NuLYTELY/GoLYTELY     TAKE these medications   allopurinol 300 MG tablet Commonly known as:  ZYLOPRIM Take 300 mg by mouth daily.   ALPRAZolam 0.5 MG tablet Commonly known as:  XANAX Take 0.25-0.5 mg by mouth at bedtime as needed for anxiety.   aspirin EC 81 MG tablet Take 1 tablet (81 mg total) by mouth 2 (two) times daily. For DVT prophylaxis for 30 days after surgery. What changed:    when to take this  additional instructions   bisoprolol-hydrochlorothiazide 2.5-6.25 MG tablet Commonly known as:  ZIAC Take 1 tablet by mouth daily.   celecoxib 100 MG capsule Commonly known as:  CELEBREX Take 1 capsule (100 mg total) by mouth 2 (two)  times daily for 14 days.   docusate sodium 100 MG capsule Commonly known as:  COLACE Take 1 capsule (100 mg total) by mouth 2 (two) times daily. To prevent constipation while taking pain medication.   gabapentin 300 MG capsule Commonly known as:  NEURONTIN Take 1 capsule (300 mg total) by mouth 2 (two) times daily for 14 days. For 2 weeks post op for pain.   HYDROcodone-acetaminophen 5-325 MG tablet Commonly known as:  NORCO/VICODIN Take 1-2 tablets by mouth every 6 (six) hours as needed for moderate pain.   ketoconazole 2 % cream Commonly known as:  NIZORAL Apply 1 application topically daily as needed (foot rash).   levothyroxine 25 MCG tablet Commonly known as:  SYNTHROID, LEVOTHROID Take 25  mcg by mouth daily before breakfast.   methocarbamol 500 MG tablet Commonly known as:  ROBAXIN Take 1 tablet (500 mg total) by mouth every 8 (eight) hours as needed for muscle spasms.   ondansetron 4 MG tablet Commonly known as:  ZOFRAN Take 1 tablet (4 mg total) by mouth every 8 (eight) hours as needed for nausea or vomiting.   PROCTOSOL HC 2.5 % rectal cream Generic drug:  hydrocortisone Place 1 application rectally daily as needed for hemorrhoids.       Diagnostic Studies: Dg C-arm 1-60 Min-no Report  Result Date: 08/26/2018 Fluoroscopy was utilized by the requesting physician.  No radiographic interpretation.   Dg Hip Operative Unilat With Pelvis Left  Result Date: 08/26/2018 CLINICAL DATA:  LEFT hip arthroplasty EXAM: OPERATIVE LEFT HIP (WITH PELVIS IF PERFORMED) 3 VIEWS TECHNIQUE: Fluoroscopic spot image(s) were submitted for interpretation post-operatively. COMPARISON:  None. FINDINGS: Intraoperative spot views of the hips are submitted postoperatively for interpretation. LEFT hip total arthroplasty identified without definite complicating features. No dislocation. RIGHT total hip arthroplasty again noted. IMPRESSION: LEFT total hip arthroplasty without definite complicating features. Electronically Signed   By: Margarette Canada M.D.   On: 08/26/2018 09:16    Disposition: Discharge disposition: 01-Home or Self Care       Discharge Instructions    Discharge patient   Complete by:  As directed    Discharge disposition:  01-Home or Self Care   Discharge patient date:  08/27/2018      Follow-up Information    Renette Butters, MD Follow up.   Specialty:  Orthopedic Surgery Contact information: [address] [place] [address] [phone]            Signed: Prudencio Burly III PA-C 08/27/2018, 7:25 AM

## 2018-08-27 NOTE — Evaluation (Signed)
Occupational Therapy Evaluation Patient Details Name: Erik Odonnell MRN: [id number] DOB: [birth year] Today's Date: 08/27/2018    History of Present Illness Patient is a 72 y/o male admitted for L direct anterior THA.  Previous R THA 2015, HTN, hyperlipidemia.   Clinical Impression   Pt was admitted for the above sx. He is independent and very active at baseline, including walking and bicycle riding.  He is also active around the home, including getting up on ladders.  Wife will assist as needed and pt verbalizes understanding of safety. No further OT is needed at this time    Follow Up Recommendations  Supervision/Assistance - 24 hour    Equipment Recommendations  None recommended by OT    Recommendations for Other Services       Precautions / Restrictions Precautions Precautions: Fall Restrictions Other Position/Activity Restrictions: WBAT      Mobility Bed Mobility               General bed mobility comments: oob  Transfers   Equipment used: Rolling walker (2 wheeled)   Sit to Stand: Min guard         General transfer comment: for safety    Balance                                           ADL either performed or assessed with clinical judgement   ADL Overall ADL's : Needs assistance/impaired     Grooming: Supervision/safety;Standing;Oral care       Lower Body Bathing: Minimal assistance;Sit to/from stand       Lower Body Dressing: Maximal assistance;Sit to/from stand   Toilet Transfer: Min guard;Ambulation;RW(chair)             General ADL Comments: pt can perform UB adls with set up. Stood with urinal in bathroom at supervision level. Pt initially unsteady but improved.  He has a tub with a grab bar but will wait a few days to shower.  Wife can assist as needed with adls.  Educated on walker safety during ADLs.  He is familiar with AE     Vision         Perception     Praxis      Pertinent Vitals/Pain Pain  Score: 1  Pain Location: L hip Pain Descriptors / Indicators: Sore Pain Intervention(s): Limited activity within patient's tolerance;Monitored during session;Premedicated before session;Repositioned;Ice applied     Hand Dominance     Extremity/Trunk Assessment Upper Extremity Assessment Upper Extremity Assessment: Overall WFL for tasks assessed           Communication Communication Communication: No difficulties   Cognition Arousal/Alertness: Awake/alert Behavior During Therapy: WFL for tasks assessed/performed Overall Cognitive Status: Within Functional Limits for tasks assessed                                     General Comments       Exercises     Shoulder Instructions      Home Living Family/patient expects to be discharged to:: Private residence Living Arrangements: Spouse/significant other Available Help at Discharge: Family               Bathroom Shower/Tub: Tub/shower unit   Bathroom Toilet: Handicapped height     Home Equipment: Environmental consultant - 2 wheels;Adaptive equipment;Shower seat;Toilet  riser;Grab bars - tub/shower          Prior Functioning/Environment Level of Independence: Independent                 OT Problem List:        OT Treatment/Interventions:      OT Goals(Current goals can be found in the care plan section) Acute Rehab OT Goals Patient Stated Goal: to go home OT Goal Formulation: All assessment and education complete, DC therapy  OT Frequency:     Barriers to D/C:            Co-evaluation              AM-PAC PT "6 Clicks" Daily Activity     Outcome Measure Help from another person eating meals?: None Help from another person taking care of personal grooming?: A Little Help from another person toileting, which includes using toliet, bedpan, or urinal?: A Little Help from another person bathing (including washing, rinsing, drying)?: A Little Help from another person to put on and taking off  regular upper body clothing?: A Little Help from another person to put on and taking off regular lower body clothing?: A Lot 6 Click Score: 18   End of Session    Activity Tolerance: Patient tolerated treatment well Patient left: in chair;with call bell/phone within reach;with chair alarm set  OT Visit Diagnosis: Pain Pain - Right/Left: Left Pain - part of body: Hip                Time: 9373-4287 OT Time Calculation (min): 18 min Charges:  OT General Charges $OT Visit: 1 Visit OT Evaluation $OT Eval Low Complexity: 1 Low  Lesle Chris, OTR/L Acute Rehabilitation Services [phone] [facility] pager [phone] office 08/27/2018  [place] 08/27/2018, 8:51 AM

## 2018-08-27 NOTE — Progress Notes (Signed)
Physical Therapy Treatment Patient Details Name: Erik Odonnell MRN: [id number] DOB: January 21, 1946 Today's Date: 08/27/2018    History of Present Illness Patient is a 72 y/o male admitted for L direct anterior THA.  Previous R THA 2015, HTN, hyperlipidemia.    PT Comments    The patient is progressing well. Patient is ready for DC today. Recommend HHPT as patient is highly active outside. Patient also reports that his balance is off but not to falling risk PTA.   Follow Up Recommendations  Follow surgeon's recommendation for DC plan and follow-up therapies;Home health PT     Equipment Recommendations  None recommended by PT    Recommendations for Other Services       Precautions / Restrictions Precautions Precautions: Fall Restrictions Weight Bearing Restrictions: No Other Position/Activity Restrictions: WBAT    Mobility  Bed Mobility               General bed mobility comments: oob  Transfers Overall transfer level: Needs assistance Equipment used: Rolling walker (2 wheeled) Transfers: Sit to/from Stand Sit to Stand: Min guard         General transfer comment: for safety  Ambulation/Gait Ambulation/Gait assistance: Min guard Gait Distance (Feet): 400 Feet Assistive device: Rolling walker (2 wheeled) Gait Pattern/deviations: Step-through pattern         Stairs Stairs: Yes Stairs assistance: Min guard Stair Management: Step to pattern;Forwards;With cane;One rail Left Number of Stairs: 4 General stair comments: cues for safety   Wheelchair Mobility    Modified Rankin (Stroke Patients Only)       Balance                                            Cognition Arousal/Alertness: Awake/alert Behavior During Therapy: WFL for tasks assessed/performed Overall Cognitive Status: Within Functional Limits for tasks assessed                                        Exercises Total Joint Exercises Ankle  Circles/Pumps: AROM;10 reps;Both;Supine Short Arc Quad: AROM;10 reps;Supine Heel Slides: 10 reps;Both Hip ABduction/ADduction: AROM;Left;10 reps Long Arc Quad: AROM;20 reps;Both Marching in Standing: AROM;AAROM;Seated;Left;10 reps    General Comments        Pertinent Vitals/Pain Pain Score: 3  Pain Location: L hip Pain Descriptors / Indicators: Sore Pain Intervention(s): Premedicated before session;Monitored during session;Ice applied    Home Living Family/patient expects to be discharged to:: Private residence Living Arrangements: Spouse/significant other Available Help at Discharge: Family         Home Equipment: Gilford Rile - 2 wheels;Adaptive equipment;Shower seat;Toilet riser;Grab bars - tub/shower      Prior Function Level of Independence: Independent          PT Goals (current goals can now be found in the care plan section) Acute Rehab PT Goals Patient Stated Goal: to go home Progress towards PT goals: Progressing toward goals    Frequency    7X/week      PT Plan Current plan remains appropriate    Co-evaluation              AM-PAC PT "6 Clicks" Daily Activity  Outcome Measure  Difficulty turning over in bed (including adjusting bedclothes, sheets and blankets)?: A Little Difficulty moving from lying on back to sitting  on the side of the bed? : A Little Difficulty sitting down on and standing up from a chair with arms (e.g., wheelchair, bedside commode, etc,.)?: A Little Help needed moving to and from a bed to chair (including a wheelchair)?: A Little Help needed walking in hospital room?: A Little Help needed climbing 3-5 steps with a railing? : A Little 6 Click Score: 18    End of Session   Activity Tolerance: Patient tolerated treatment well Patient left: in chair;with call bell/phone within reach;with family/visitor present Nurse Communication: Mobility status PT Visit Diagnosis: Difficulty in walking, not elsewhere classified  (R26.2);Pain Pain - Right/Left: Left Pain - part of body: Hip     Time: 6060-0459 PT Time Calculation (min) (ACUTE ONLY): 50 min  Charges:  $Gait Training: 8-22 mins $Therapeutic Exercise: 8-22 mins $Self Care/Home Management: [place] Pager [phone] Office [phone]    Claretha Cooper 08/27/2018, 10:51 AM

## 2018-08-27 NOTE — Care Management Note (Signed)
Case Management Note  Patient Details  Name: Erik Odonnell MRN: [id number] Date of Birth: 12/20/1945  Subjective/Objective:      Discharge planning, spoke with patient and spouse at beside. Chose [facility] for [facility] services, PT to eval and treat. Requested therapist Tressia Danas  Action/Plan: [facility] for referral. They have accepted. Has RW and 3-n-1. [phone]               Expected Discharge Date:  08/27/18               Expected Discharge Plan:  [place]  In-House Referral:  NA  Discharge planning Services  CM Consult  Post Acute Care Choice:  Home Health Choice offered to:  Patient  DME Arranged:  N/A DME Agency:  NA  HH Arranged:  PT [place] Agency:  [facility] [place]  Status of Service:  Completed, signed off  If discussed at [place] of Stay Meetings, dates discussed:    Additional Comments:  Guadalupe Maple, RN 08/27/2018, 11:00 AM

## 2018-08-27 NOTE — Progress Notes (Signed)
    Subjective: Patient reports pain as mild.  Tolerating diet.  Urinating.  No CP, SOB.  Good early mobilization with therapy.  Objective:   VITALS:   Vitals:   08/26/18 1720 08/26/18 2149 08/27/18 0205 08/27/18 0603  BP: 111/76 (!) 141/81 136/75 133/74  Pulse: 80 81 71 60  Resp: 16 16  15   Temp: (!) 97.5 F (36.4 C)  97.6 F (36.4 C) 97.8 F (36.6 C)  TempSrc: Oral  Oral Oral  SpO2: 94% 93% 93% 94%  Height:       CBC Latest Ref Rng & Units 08/18/2018 03/25/2014 03/17/2014  WBC 4.0 - 10.5 K/uL 6.9 12.5(H) 7.3  Hemoglobin 13.0 - 17.0 g/dL 16.3 13.0 16.0  Hematocrit 39.0 - 52.0 % 48.8 36.9(L) 45.1  Platelets 150 - 400 K/uL 161 204 198   BMP Latest Ref Rng & Units 08/18/2018 03/25/2014 03/17/2014  Glucose 70 - 99 mg/dL 107(H) 140(H) 95  BUN 8 - 23 mg/dL 29(H) 18 18  Creatinine 0.61 - 1.24 mg/dL 1.23 1.00 1.03  Sodium 135 - 145 mmol/L 135 133(L) 134(L)  Potassium 3.5 - 5.1 mmol/L 4.2 4.3 4.2  Chloride 98 - 111 mmol/L 100 95(L) 96  CO2 22 - 32 mmol/L 26 23 24   Calcium 8.9 - 10.3 mg/dL 9.1 9.0 9.3   Intake/Output      11/05 0701 - 11/06 0700 11/06 0701 - 11/07 0700   P.O. 960    I.V. 3243.2    IV Piggyback 250    Total Intake 4453.2    Urine 2000    Blood 350    Total Output 2350    Net +2103.2            Physical Exam: General: NAD.  Upright in bed on arrival.  Calm, conversant. Resp: No increased wob Cardio: regular rate and rhythm ABD soft Neurologically intact MSK LLE: Neurovascularly intact Sensation intact distally Feet warm Dorsiflexion/Plantar flexion intact Incision: dressing C/D/I   Assessment: 1 Day Post-Op  S/P Procedure(s) (LRB): LEFT TOTAL HIP ARTHROPLASTY ANTERIOR APPROACH (Left) by Dr. Ernesta Amble. Murphy on 08/26/2018  Principal Problem:   Primary osteoarthritis of left hip Active Problems:   Primary osteoarthritis of hip   Primary osteoarthritis, status post total hip arthroplasty Doing well postop day 1 Eating, drinking, and  voiding Pain controlled Good early mobilization with therapy Ready for discharge after therapy session this morning  Plan: Up with therapy Incentive Spirometry Apply ice   Weight Bearing: Weight Bearing as Tolerated (WBAT) LLE Dressings: Maintain Mepilex.   VTE prophylaxis: Aspirin, SCDs, ambulation Dispo: Home today.   Charna Elizabeth Martensen III, PA-C 08/27/2018, 7:22 AM

## 2018-08-28 ENCOUNTER — Encounter (HOSPITAL_COMMUNITY): Payer: Self-pay | Admitting: Orthopedic Surgery

## 2019-09-08 ENCOUNTER — Other Ambulatory Visit: Payer: Self-pay

## 2019-09-08 ENCOUNTER — Encounter (HOSPITAL_COMMUNITY): Payer: Self-pay | Admitting: Physical Therapy

## 2019-09-08 ENCOUNTER — Ambulatory Visit (HOSPITAL_COMMUNITY): Payer: Medicare Other | Attending: Orthopedic Surgery | Admitting: Physical Therapy

## 2019-09-08 DIAGNOSIS — M25652 Stiffness of left hip, not elsewhere classified: Secondary | ICD-10-CM | POA: Insufficient documentation

## 2019-09-08 DIAGNOSIS — M25651 Stiffness of right hip, not elsewhere classified: Secondary | ICD-10-CM | POA: Insufficient documentation

## 2019-09-08 DIAGNOSIS — M6281 Muscle weakness (generalized): Secondary | ICD-10-CM | POA: Insufficient documentation

## 2019-09-08 NOTE — Patient Instructions (Signed)
Access Code: [email or web address]  URL: [email or web address]  Date: 09/08/2019  Prepared by: Josue Hector   Exercises Supine Bridge - 10 reps - 2 sets - 2x daily - 7x weekly Modified Thomas Stretch - 3 reps - 1 sets - 30 hold - 2x daily - 7x weekly

## 2019-09-08 NOTE — Therapy (Signed)
[place], [place], [address] Phone: [phone]   Fax:  [phone]  Physical Therapy Evaluation  Patient Details  Name: Erik Odonnell MRN: [id number] Date of Birth: May 22, 1946 Referring Provider (PT): Edmonia Lynch MD   Encounter Date: 09/08/2019  PT End of Session - 09/08/19 1215    Visit Number  1    Number of Visits  12    Date for PT Re-Evaluation  10/23/19   Mini reassess 12/11 or 10th visit   Authorization Type  [facility] Medicare    Authorization Time Period  09/08/19-10/23/19    Authorization - Visit Number  1    Authorization - Number of Visits  10    PT Start Time  1115    PT Stop Time  1200    PT Time Calculation (min)  45 min    Activity Tolerance  Patient tolerated treatment well    Behavior During Therapy  [facility] for tasks assessed/performed       Past Medical History:  Diagnosis Date  . Arthritis   . Diverticulosis   . Hemifacial spasm    left side affected takes Artane daily;   . History of bronchitis 11/2013  . Hypertension    takes Ziac daily  . Joint pain     Past Surgical History:  Procedure Laterality Date  . CATARACT EXTRACTION, BILATERAL Bilateral 2015, 2016  . COLONOSCOPY    . COLONOSCOPY N/A 12/18/2017   Procedure: COLONOSCOPY;  Surgeon: Rogene Houston, MD;  [facility];  Service: Endoscopy;  Laterality: N/A;  930  . eye muschle surgeries  as a child   x 5  . HERNIA REPAIR Left    as a child-inguinal;right inguinal in 1969  . TOTAL HIP ARTHROPLASTY Right 03/24/2014   Procedure: TOTAL HIP ARTHROPLASTY ANTERIOR APPROACH;  Surgeon: Ninetta Lights, MD;  Location: [facility];  Service: Orthopedics;  Laterality: Right;  . TOTAL HIP ARTHROPLASTY Left 08/26/2018   Procedure: LEFT TOTAL HIP ARTHROPLASTY ANTERIOR APPROACH;  Surgeon: Renette Butters, MD;  Location: [facility];  Service: Orthopedics;  Laterality: Left;    There were no vitals filed for this visit.   Subjective  Assessment - 09/08/19 1126    Subjective  Patient presents to physical therapy with complaint of bilateral hip stiffness. Patient reports having RT THA in 2015 and LT THA in 2019. Patient says he underwent a brief course of therapy for acute rehab of both hips, but feels that he is not moving quite as well. Patient says his hips feel stiff, and that he is walking slower than he use to. Patient says he would like to improve his mobility, reduce stiffness, and get back his PLOF with ADLs and leisure activity. Patient denies having pain.    Limitations  Lifting;Standing;Walking    How long can you stand comfortably?  30 minutes    How long can you walk comfortably?  2-3 miles slowly    Patient Stated Goals  Improve walk and balance    Currently in Pain?  No/denies         [facility] PT Assessment - 09/08/19 0001      Assessment   Medical Diagnosis  Bilateral hip stiffness    Referring Provider (PT)  Edmonia Lynch MD    Onset Date/Surgical Date  09/05/18    Next MD Visit  None scheduled    Prior Therapy  Yes       Precautions  Precautions  None      Restrictions   Weight Bearing Restrictions  No      Balance Screen   Has the patient fallen in the past 6 months  No    Has the patient had a decrease in activity level because of a fear of falling?   No    Is the patient reluctant to leave their home because of a fear of falling?   No      Home Film/video editor residence      Prior Function   Level of Independence  Independent    Vocation  Retired    Leisure  Walking, farming      Cognition   Overall Cognitive Status  Within Functional Limits for tasks assessed      Observation/Other Assessments   Focus on Therapeutic Outcomes (FOTO)   20% limited       Sensation   Light Touch  Appears Intact      ROM / Strength   AROM / PROM / Strength  AROM;Strength      AROM   AROM Assessment Site  Hip    Right/Left Hip  Right;Left    Right Hip Extension  7     Right Hip Flexion  109    Left Hip Extension  10    Left Hip Flexion  106      Strength   Strength Assessment Site  Hip;Knee;Ankle    Right/Left Hip  Right;Left    Right Hip Flexion  5/5    Right Hip Extension  4/5    Right Hip ABduction  4/5    Left Hip Flexion  5/5    Left Hip Extension  4/5    Left Hip ABduction  4-/5    Right/Left Knee  Right;Left    Right Knee Flexion  5/5    Right Knee Extension  5/5    Left Knee Flexion  5/5    Left Knee Extension  5/5    Right/Left Ankle  Right;Left    Right Ankle Dorsiflexion  5/5    Left Ankle Dorsiflexion  5/5      Flexibility   Soft Tissue Assessment /Muscle Length  yes   Min restriction LT hip flexor; Mod restriciton RT hip flexor     Ambulation/Gait   Ambulation/Gait  Yes    Ambulation Distance (Feet)  485 Feet    Assistive device  None    Gait Pattern  Decreased step length - right;Decreased stance time - right    Ambulation Surface  Level    Gait velocity  1.23 m/s    Gait Comments  2MWT      Balance   Balance Assessed  Yes      Static Standing Balance   Static Standing Balance -  Activities   Single Leg Stance - Right Leg;Single Leg Stance - Left Leg;Tandam Stance - Right Leg;Tandam Stance - Left Leg    Static Standing - Comment/# of Minutes  SLS L: <3 sec; R: 5 sec mod sway; RT tandem: 30 sec mod sway; LT: 30 sec min sway                Objective measurements completed on examination: See above findings.              PT Education - 09/08/19 1214    Education Details  Patient educated on evaluation findings, POC and HEP    Person(s) Educated  Patient  Methods  Explanation;Handout    Comprehension  Verbalized understanding       PT Short Term Goals - 09/08/19 1220      PT SHORT TERM GOAL #1   Title  Patient will be independent with initial HEP to improve functional outcomes    Time  3    Period  Weeks    Status  New    Target Date  10/02/19        PT Long Term Goals - 09/08/19  1221      PT LONG TERM GOAL #1   Title  Patient will be able to perform SLS bilaterally on compliant surface to improve stability funcitonal mobility on uneven surfaces (in yard)    Time  6    Period  Weeks    Status  New    Target Date  10/23/19      PT LONG TERM GOAL #2   Title  Patient will have greater then or equal to 4+/5 MMT in BLE to improve funcitonal mobility and ability to return to gym.    Time  6    Period  Weeks    Status  New    Target Date  10/23/19      PT LONG TERM GOAL #3   Title  Patient will improve bilateral hip flexion to >115 and hip extension >10 degrees in order to improve stride length and increase gait speed, and potentially return to jogging.    Time  6    Period  Weeks    Status  New    Target Date  10/23/19             Plan - 09/08/19 1216    Clinical Impression Statement  Patient is a 73 y.o. male who presents to physical therapy with complaint of bilateral hip stiffness. Patient demonstrates decreased strength, ROM restriction, balance deficits and gait abnormalities which are negatively impacting patient ability to perform ADLs and functional mobility tasks. Patient will benefit from skilled physical therapy services to improve level of function with ADLs, functional mobility tasks, and reduce risk for falls.    Personal Factors and Comorbidities  Age    Examination-Activity Limitations  Bathing;Sit;Bed Mobility;Sleep;Bend;Squat;Stairs;Carry;Stand;Transfers;Dressing;Locomotion Level;Lift    Examination-Participation Restrictions  Cleaning;Community Activity;Yard Work    Stability/Clinical Decision Making  Stable/Uncomplicated    Designer, jewellery  Low    Rehab Potential  Good    PT Frequency  2x / week    PT Duration  6 weeks    PT Treatment/Interventions  ADLs/Self Care Home Management;Biofeedback;Electrical Stimulation;Cryotherapy;Moist Heat;Balance training;Therapeutic exercise;Manual techniques;Therapeutic activities;Functional  mobility training;Orthotic Fit/Training;Stair training;Gait training;DME Instruction;Patient/family education;Neuromuscular re-education;Compression bandaging;Scar mobilization;Passive range of motion;Energy conservation;Splinting;Taping;Vasopneumatic Device;Joint Manipulations    PT Next Visit Plan  Review HEP and goals. Progress bilateral hip mobility and strengthening as able (squats, step ups, lunge, band, sidestepping). Add balance and progress as tolerated.    PT Home Exercise Plan  09/08/19: mod thomas stretch, bridge    Consulted and Agree with Plan of Care  Patient       Patient will benefit from skilled therapeutic intervention in order to improve the following deficits and impairments:  Abnormal gait, Hypomobility, Decreased strength, Decreased balance, Decreased mobility, Difficulty walking, Decreased range of motion, Impaired flexibility, Improper body mechanics  Visit Diagnosis: Stiffness of left hip, not elsewhere classified  Stiffness of right hip, not elsewhere classified  Muscle weakness (generalized)     Problem List Patient Active Problem List   Diagnosis Date Noted  .  Primary osteoarthritis of hip 08/26/2018  . Primary osteoarthritis of left hip 08/04/2018  . Special screening for malignant neoplasms, colon 09/11/2017  . Proximal leg weakness 04/07/2014  . Difficulty in walking(719.7) 04/07/2014  . Impairment of balance 04/07/2014  . Primary localized osteoarthrosis, pelvic region and thigh 03/24/2014   12:27 PM, 09/08/19 Josue Hector PT DPT  Physical Therapist with [facility]  [phone] [place], [place], [address] Phone: [phone]   Fax:  [phone]  Name: Erik Odonnell MRN: [id number] Date of Birth: 1946/01/22

## 2019-09-10 ENCOUNTER — Other Ambulatory Visit: Payer: Self-pay

## 2019-09-10 ENCOUNTER — Encounter (HOSPITAL_COMMUNITY): Payer: Self-pay

## 2019-09-10 ENCOUNTER — Ambulatory Visit (HOSPITAL_COMMUNITY): Payer: Medicare Other

## 2019-09-10 DIAGNOSIS — M25651 Stiffness of right hip, not elsewhere classified: Secondary | ICD-10-CM

## 2019-09-10 DIAGNOSIS — M25652 Stiffness of left hip, not elsewhere classified: Secondary | ICD-10-CM | POA: Diagnosis not present

## 2019-09-10 DIAGNOSIS — M6281 Muscle weakness (generalized): Secondary | ICD-10-CM

## 2019-09-10 NOTE — Therapy (Signed)
[place], [place], [address] Phone: [phone]   Fax:  [phone]  Physical Therapy Treatment  Patient Details  Name: Erik Odonnell MRN: [id number] Date of Birth: Mar 06, 1946 Referring Provider (PT): Edmonia Lynch MD   Encounter Date: 09/10/2019  PT End of Session - 09/10/19 0925    Visit Number  2    Number of Visits  12    Date for PT Re-Evaluation  10/23/19   Minireassess on 12/11 or visit #10   Authorization Type  [facility]    Authorization Time Period  09/08/19-10/23/19    Authorization - Visit Number  2    Authorization - Number of Visits  10    PT Start Time  0922    PT Stop Time  1003    PT Time Calculation (min)  41 min    Activity Tolerance  Patient tolerated treatment well    Behavior During Therapy  [facility] for tasks assessed/performed       Past Medical History:  Diagnosis Date  . Arthritis   . Diverticulosis   . Hemifacial spasm    left side affected takes Artane daily;   . History of bronchitis 11/2013  . Hypertension    takes Ziac daily  . Joint pain     Past Surgical History:  Procedure Laterality Date  . CATARACT EXTRACTION, BILATERAL Bilateral 2015, 2016  . COLONOSCOPY    . COLONOSCOPY N/A 12/18/2017   Procedure: COLONOSCOPY;  Surgeon: Rogene Houston, MD;  [facility];  Service: Endoscopy;  Laterality: N/A;  930  . eye muschle surgeries  as a child   x 5  . HERNIA REPAIR Left    as a child-inguinal;right inguinal in 1969  . TOTAL HIP ARTHROPLASTY Right 03/24/2014   Procedure: TOTAL HIP ARTHROPLASTY ANTERIOR APPROACH;  Surgeon: Ninetta Lights, MD;  Location: [place];  Service: Orthopedics;  Laterality: Right;  . TOTAL HIP ARTHROPLASTY Left 08/26/2018   Procedure: LEFT TOTAL HIP ARTHROPLASTY ANTERIOR APPROACH;  Surgeon: Renette Butters, MD;  [facility];  Service: Orthopedics;  Laterality: Left;    There were no vitals filed for this visit.  Subjective  Assessment - 09/10/19 0924    Subjective  Pt reports he is feeling good today, reports a little sore this morning from chopping wood.  Has began HEP, no questions concerning.    Patient Stated Goals  Improve walk and balance    Currently in Pain?  No/denies         [facility] PT Assessment - 09/10/19 0001      Assessment   Medical Diagnosis  Bilateral hip stiffness    Referring Provider (PT)  Edmonia Lynch MD    Onset Date/Surgical Date  09/05/18    Next MD Visit  None scheduled    Prior Therapy  Yes       Precautions   Precautions  None                   [facility] Adult PT Treatment/Exercise - 09/10/19 0001      Exercises   Exercises  Knee/Hip      Knee/Hip Exercises: Stretches   Hip Flexor Stretch  1 rep;20 seconds    Hip Flexor Stretch Limitations  thomas stretch      Knee/Hip Exercises: Standing   Forward Lunges  10 reps;Both    Hip Abduction  Both;10 reps;Knee straight    Abduction Limitations  GTB  Forward Step Up  Both;10 reps;Hand Hold: 0;Step Height: 6"    Functional Squat  10 reps    Functional Squat Limitations  front of chair for mechanics    SLS  Rt 8", Lt 3"    Other Standing Knee Exercises  sidestep 2RT GTB      Knee/Hip Exercises: Supine   Bridges  10 reps      Knee/Hip Exercises: Sidelying   Hip ABduction  10 reps;Both             PT Education - 09/10/19 0930    Education Details  Reviewed goals, assured compliance with HEP- pt able to demonstrate and verbalize appropriate mechanics wihtout cueing required    Person(s) Educated  Patient    Methods  Explanation    Comprehension  Verbalized understanding       PT Short Term Goals - 09/08/19 1220      PT SHORT TERM GOAL #1   Title  Patient will be independent with initial HEP to improve functional outcomes    Time  3    Period  Weeks    Status  New    Target Date  10/02/19        PT Long Term Goals - 09/08/19 1221      PT LONG TERM GOAL #1   Title  Patient will be able  to perform SLS bilaterally on compliant surface to improve stability funcitonal mobility on uneven surfaces (in yard)    Time  6    Period  Weeks    Status  New    Target Date  10/23/19      PT LONG TERM GOAL #2   Title  Patient will have greater then or equal to 4+/5 MMT in BLE to improve funcitonal mobility and ability to return to gym.    Time  6    Period  Weeks    Status  New    Target Date  10/23/19      PT LONG TERM GOAL #3   Title  Patient will improve bilateral hip flexion to >115 and hip extension >10 degrees in order to improve stride length and increase gait speed, and potentially return to jogging.    Time  6    Period  Weeks    Status  New    Target Date  10/23/19            Plan - 09/10/19 1116    Clinical Impression Statement  Reviewed goals and assured compliance wiht HEP.  Pt able to verbalize and demonstrate exercises.  Session focus on hip strengthening.  Min cueing for form with squats and lunges for mechanics, able to demonstrate good form following the cueing.  Added SLS that was most difficult this session, encouraged pt to add to HEP.  No reports of pain through session, was limited by fatigue.    Personal Factors and Comorbidities  Age    Examination-Activity Limitations  Bathing;Sit;Bed Mobility;Sleep;Bend;Squat;Stairs;Carry;Stand;Transfers;Dressing;Locomotion Level;Lift    Examination-Participation Restrictions  Cleaning;Community Activity;Yard Work    Stability/Clinical Decision Making  Stable/Uncomplicated    Designer, jewellery  Low    Rehab Potential  Good    PT Frequency  2x / week    PT Duration  6 weeks    PT Treatment/Interventions  ADLs/Self Care Home Management;Biofeedback;Electrical Stimulation;Cryotherapy;Moist Heat;Balance training;Therapeutic exercise;Manual techniques;Therapeutic activities;Functional mobility training;Orthotic Fit/Training;Stair training;Gait training;DME Instruction;Patient/family education;Neuromuscular  re-education;Compression bandaging;Scar mobilization;Passive range of motion;Energy conservation;Splinting;Taping;Vasopneumatic Device;Joint Manipulations    PT Next Visit Plan  Begin tandem stance and progress to vector as able.  Progress bilateral hip mobility and strengthening as able (squats, step ups, lunge, band, sidestepping). Add balance and progress as tolerated.    PT Home Exercise Plan  09/08/19: mod thomas stretch, bridge; 11/19: sidelying abduction and SLS       Patient will benefit from skilled therapeutic intervention in order to improve the following deficits and impairments:  Abnormal gait, Hypomobility, Decreased strength, Decreased balance, Decreased mobility, Difficulty walking, Decreased range of motion, Impaired flexibility, Improper body mechanics  Visit Diagnosis: Stiffness of right hip, not elsewhere classified  Muscle weakness (generalized)  Stiffness of left hip, not elsewhere classified     Problem List Patient Active Problem List   Diagnosis Date Noted  . Primary osteoarthritis of hip 08/26/2018  . Primary osteoarthritis of left hip 08/04/2018  . Special screening for malignant neoplasms, colon 09/11/2017  . Proximal leg weakness 04/07/2014  . Difficulty in walking(719.7) 04/07/2014  . Impairment of balance 04/07/2014  . Primary localized osteoarthrosis, pelvic region and thigh 03/24/2014   Ihor Austin, [place]; [place]  Aldona Lento 09/10/2019, 11:24 AM  [place] [address] [place], [place], [address] Phone: [phone]   Fax:  [phone]  Name: Erik Odonnell MRN: [id number] Date of Birth: 06/21/1946

## 2019-09-10 NOTE — Patient Instructions (Signed)
Abduction: Side Leg Lift (Eccentric) - Side-Lying    Lie on side. Lift top leg slightly higher than shoulder level. Keep top leg straight with body, toes pointing forward. Slowly lower for 3-5 seconds. 10 reps per set, 1-2 sets per day, 4 days per week.  http://ecce.exer.us/62   Copyright  VHI. All rights reserved.   SINGLE LIMB STANCE    Stance: single leg on floor. Raise leg. Hold 30 seconds. Repeat with other leg. 5 reps per set, 1-2 sets per day, 6 days per week  Copyright  VHI. All rights reserved.   Bridging    Slowly raise buttocks from floor, keeping stomach tight. Repeat 10 times per set. Do 2 sets per session. Do 1-2 sessions per day.  http://orth.exer.us/1096   Copyright  VHI. All rights reserved.   HIP: Flexors - Supine    Lie on edge of surface. Place leg off the surface, allow knee to bend. Bring other knee toward chest. Hold 20 seconds. 2 reps per set, 1 sets per day, 4 days per week Rest lowered foot on stool.  Copyright  VHI. All rights reserved.

## 2019-09-22 ENCOUNTER — Ambulatory Visit (HOSPITAL_COMMUNITY): Payer: Medicare Other | Attending: Orthopedic Surgery | Admitting: Physical Therapy

## 2019-09-22 ENCOUNTER — Encounter (HOSPITAL_COMMUNITY): Payer: Self-pay | Admitting: Physical Therapy

## 2019-09-22 ENCOUNTER — Other Ambulatory Visit: Payer: Self-pay

## 2019-09-22 DIAGNOSIS — M6281 Muscle weakness (generalized): Secondary | ICD-10-CM | POA: Insufficient documentation

## 2019-09-22 DIAGNOSIS — M25651 Stiffness of right hip, not elsewhere classified: Secondary | ICD-10-CM | POA: Diagnosis not present

## 2019-09-22 DIAGNOSIS — M25652 Stiffness of left hip, not elsewhere classified: Secondary | ICD-10-CM | POA: Diagnosis present

## 2019-09-22 NOTE — Therapy (Signed)
Erik Odon[place], [place], [address] Phone: [phone]   Fax:  [phone]  Physical Therapy Treatment  Patient Details  Name: Erik Odonnell MRN: [id number] Date of Birth: May 19, 1946 Referring Provider (PT): Edmonia Lynch MD   Encounter Date: 09/22/2019  PT End of Session - 09/22/19 1121    Visit Number  3    Number of Visits  12    Date for PT Re-Evaluation  10/23/19   Minireassess on 12/11 or visit #10   Authorization Type  [facility] Medicare    Authorization Time Period  09/08/19-10/23/19    Authorization - Visit Number  3    Authorization - Number of Visits  10    PT Start Time  1117    PT Stop Time  1202    PT Time Calculation (min)  45 min    Activity Tolerance  Patient tolerated treatment well    Behavior During Therapy  [facility] for tasks assessed/performed       Past Medical History:  Diagnosis Date  . Arthritis   . Diverticulosis   . Hemifacial spasm    left side affected takes Artane daily;   . History of bronchitis 11/2013  . Hypertension    takes Ziac daily  . Joint pain     Past Surgical History:  Procedure Laterality Date  . CATARACT EXTRACTION, BILATERAL Bilateral 2015, 2016  . COLONOSCOPY    . COLONOSCOPY N/A 12/18/2017   Procedure: COLONOSCOPY;  Surgeon: Erik Houston, MD;  [facility];  Service: Endoscopy;  Laterality: N/A;  930  . eye muschle surgeries  as a child   x 5  . HERNIA REPAIR Left    as a child-inguinal;right inguinal in 1969  . TOTAL HIP ARTHROPLASTY Right 03/24/2014   Procedure: TOTAL HIP ARTHROPLASTY ANTERIOR APPROACH;  Surgeon: Erik Lights, MD;  Location: [place];  Service: Orthopedics;  Laterality: Right;  . TOTAL HIP ARTHROPLASTY Left 08/26/2018   Procedure: LEFT TOTAL HIP ARTHROPLASTY ANTERIOR APPROACH;  Surgeon: Erik Butters, MD;  [facility];  Service: Orthopedics;  Laterality: Left;    There were no vitals filed for this visit.  Subjective  Assessment - 09/22/19 1126    Subjective  Patient says he is doing well and just returned from a week at the beach. Patient says he has been walking more and counting his steps. Patient says he has been compliant with HEP with no issues other than the beds in his house are too soft for mod Thomas stretching. Patient noticed his LT hip is sore when he lays on it lately but is unsure why. Patient reports no pain with movement.    Patient Stated Goals  Improve walk and balance    Currently in Pain?  No/denies                       [facility] Adult PT Treatment/Exercise - 09/22/19 0001      Knee/Hip Exercises: Stretches   Hip Flexor Stretch  Both;2 reps;30 seconds    Hip Flexor Stretch Limitations  thomas stretch    Knee: Self-Stretch to increase Flexion  Both;3 reps;10 seconds    Knee: Self-Stretch Limitations  on second step at stairs       Knee/Hip Exercises: Machines for Strengthening   Cybex Leg Press  2 x 15 both legs; 50#      Knee/Hip Exercises: Standing   Forward Lunges  10 reps;Both  Forward Lunges Limitations  reverse lunge with HHA x1    Hip Abduction  Both;20 reps    Abduction Limitations  GTB    Hip Extension  Both;20 reps;1 set    Extension Limitations  GTB     Functional Squat  2 sets;10 seconds    SLS  3 x 10" with HHA x1    Other Standing Knee Exercises  staggered stance with palloff press; each leg back; x20 ead red    Other Standing Knee Exercises  weighted walkouts 40# x10               PT Short Term Goals - 09/08/19 1220      PT SHORT TERM GOAL #1   Title  Patient will be independent with initial HEP to improve functional outcomes    Time  3    Period  Weeks    Status  New    Target Date  10/02/19        PT Long Term Goals - 09/08/19 1221      PT LONG TERM GOAL #1   Title  Patient will be able to perform SLS bilaterally on compliant surface to improve stability funcitonal mobility on uneven surfaces (in yard)    Time  6    Period   Weeks    Status  New    Target Date  10/23/19      PT LONG TERM GOAL #2   Title  Patient will have greater then or equal to 4+/5 MMT in BLE to improve funcitonal mobility and ability to return to gym.    Time  6    Period  Weeks    Status  New    Target Date  10/23/19      PT LONG TERM GOAL #3   Title  Patient will improve bilateral hip flexion to >115 and hip extension >10 degrees in order to improve stride length and increase gait speed, and potentially return to jogging.    Time  6    Period  Weeks    Status  New    Target Date  10/23/19            Plan - 09/22/19 1214    Clinical Impression Statement  Patient tolerated treatment well today. Patient reported no increased pain during session. Patient educated on proper form with hip flexor stretching, and impact on improving stride length during gait. Patient was challenged with SLS balance activity as well as reverse lunging, requiring HHA x1. Patient required verbal cueing for proper form and body mechanics with reverse lunges. Patient educated on purpose and function of all other added exercise this treatment.    Personal Factors and Comorbidities  Age    Examination-Activity Limitations  Bathing;Sit;Bed Mobility;Sleep;Bend;Squat;Stairs;Carry;Stand;Transfers;Dressing;Locomotion Level;Lift    Examination-Participation Restrictions  Cleaning;Community Activity;Yard Work    Stability/Clinical Decision Making  Stable/Uncomplicated    Rehab Potential  Good    PT Frequency  2x / week    PT Duration  6 weeks    PT Treatment/Interventions  ADLs/Self Care Home Management;Biofeedback;Electrical Stimulation;Cryotherapy;Moist Heat;Balance training;Therapeutic exercise;Manual techniques;Therapeutic activities;Functional mobility training;Orthotic Fit/Training;Stair training;Gait training;DME Instruction;Patient/family education;Neuromuscular re-education;Compression bandaging;Scar mobilization;Passive range of motion;Energy  conservation;Splinting;Taping;Vasopneumatic Device;Joint Manipulations    PT Next Visit Plan  Continue to progress hip mobility and balance as tolerated. Add step over hurdles next visit. Progress leg press weight.    PT Home Exercise Plan  09/08/19: mod thomas stretch, bridge; 11/19: sidelying abduction and SLS  Patient will benefit from skilled therapeutic intervention in order to improve the following deficits and impairments:  Abnormal gait, Hypomobility, Decreased strength, Decreased balance, Decreased mobility, Difficulty walking, Decreased range of motion, Impaired flexibility, Improper body mechanics  Visit Diagnosis: Stiffness of right hip, not elsewhere classified  Muscle weakness (generalized)  Stiffness of left hip, not elsewhere classified     Problem List Patient Active Problem List   Diagnosis Date Noted  . Primary osteoarthritis of hip 08/26/2018  . Primary osteoarthritis of left hip 08/04/2018  . Special screening for malignant neoplasms, colon 09/11/2017  . Proximal leg weakness 04/07/2014  . Difficulty in walking(719.7) 04/07/2014  . Impairment of balance 04/07/2014  . Primary localized osteoarthrosis, pelvic region and thigh 03/24/2014    12:18 PM, 09/22/19 Josue Hector PT DPT  Physical Therapist with [facility]  [phone] [place] [address] [place], [place], [address] Phone: [phone]   Fax:  [phone]  Name: KINGSLEY SKLAR MRN: [id number] Date of Birth: April 11, 1946

## 2019-09-24 ENCOUNTER — Ambulatory Visit (HOSPITAL_COMMUNITY): Payer: Medicare Other | Admitting: Physical Therapy

## 2019-09-24 ENCOUNTER — Encounter (HOSPITAL_COMMUNITY): Payer: Self-pay | Admitting: Physical Therapy

## 2019-09-24 ENCOUNTER — Other Ambulatory Visit: Payer: Self-pay

## 2019-09-24 DIAGNOSIS — M25651 Stiffness of right hip, not elsewhere classified: Secondary | ICD-10-CM

## 2019-09-24 DIAGNOSIS — M6281 Muscle weakness (generalized): Secondary | ICD-10-CM

## 2019-09-24 DIAGNOSIS — M25652 Stiffness of left hip, not elsewhere classified: Secondary | ICD-10-CM

## 2019-09-24 NOTE — Therapy (Signed)
[place], [place], [address] Phone: [phone]   Fax:  [phone]  Physical Therapy Treatment  Patient Details  Name: Erik Odonnell MRN: [id number] Date of Birth: 1946/05/09 Referring Provider (PT): Edmonia Lynch MD   Encounter Date: 09/24/2019  PT End of Session - 09/24/19 1038    Visit Number  4    Number of Visits  12    Date for PT Re-Evaluation  10/23/19   Minireassess on 12/11 or visit #10   Authorization Type  [facility] Medicare    Authorization Time Period  09/08/19-10/23/19    Authorization - Visit Number  4    Authorization - Number of Visits  10    PT Start Time  [id number]    PT Stop Time  1112    PT Time Calculation (min)  40 min    Activity Tolerance  Patient tolerated treatment well    Behavior During Therapy  [facility] for tasks assessed/performed       Past Medical History:  Diagnosis Date  . Arthritis   . Diverticulosis   . Hemifacial spasm    left side affected takes Artane daily;   . History of bronchitis 11/2013  . Hypertension    takes Ziac daily  . Joint pain     Past Surgical History:  Procedure Laterality Date  . CATARACT EXTRACTION, BILATERAL Bilateral 2015, 2016  . COLONOSCOPY    . COLONOSCOPY N/A 12/18/2017   Procedure: COLONOSCOPY;  Surgeon: Rogene Houston, MD;  [facility];  Service: Endoscopy;  Laterality: N/A;  930  . eye muschle surgeries  as a child   x 5  . HERNIA REPAIR Left    as a child-inguinal;right inguinal in 1969  . TOTAL HIP ARTHROPLASTY Right 03/24/2014   Procedure: TOTAL HIP ARTHROPLASTY ANTERIOR APPROACH;  Surgeon: Ninetta Lights, MD;  Location: [place];  Service: Orthopedics;  Laterality: Right;  . TOTAL HIP ARTHROPLASTY Left 08/26/2018   Procedure: LEFT TOTAL HIP ARTHROPLASTY ANTERIOR APPROACH;  Surgeon: Renette Butters, MD;  [facility];  Service: Orthopedics;  Laterality: Left;    There were no vitals filed for this visit.  Subjective  Assessment - 09/24/19 1037    Subjective  Patient reports "Im not as sore as I was yesterday". Patient says he was sore in hip and lumbar muscles after exercises last session, but that he is feeling better today. Patient reports no pain currently.    Patient Stated Goals  Improve walk and balance    Currently in Pain?  No/denies                       [facility] Adult PT Treatment/Exercise - 09/24/19 0001      Knee/Hip Exercises: Stretches   Knee: Self-Stretch to increase Flexion  Both;10 seconds;5 reps    Knee: Self-Stretch Limitations  on second step at stairs     Gastroc Stretch  Both;3 reps;30 seconds   on bottom step     Knee/Hip Exercises: Aerobic   Tread Mill  4 minutes at 2.5 MPH for improving and monitoring gait speed      Knee/Hip Exercises: Machines for Strengthening   Cybex Leg Press  2 x 15 both legs; 50#      Knee/Hip Exercises: Standing   Other Standing Knee Exercises  staggered stance with palloff press; each leg back; x20 ead red; band sidestepping 2RT, green; Band monster walks forward; 2 RT,  green    Other Standing Knee Exercises  weighted walkouts 40# x10; hurdle amb, 4 RT over 4 6 inch hurdles                PT Short Term Goals - 09/08/19 1220      PT SHORT TERM GOAL #1   Title  Patient will be independent with initial HEP to improve functional outcomes    Time  3    Period  Weeks    Status  New    Target Date  10/02/19        PT Long Term Goals - 09/08/19 1221      PT LONG TERM GOAL #1   Title  Patient will be able to perform SLS bilaterally on compliant surface to improve stability funcitonal mobility on uneven surfaces (in yard)    Time  6    Period  Weeks    Status  New    Target Date  10/23/19      PT LONG TERM GOAL #2   Title  Patient will have greater then or equal to 4+/5 MMT in BLE to improve funcitonal mobility and ability to return to gym.    Time  6    Period  Weeks    Status  New    Target Date  10/23/19      PT  LONG TERM GOAL #3   Title  Patient will improve bilateral hip flexion to >115 and hip extension >10 degrees in order to improve stride length and increase gait speed, and potentially return to jogging.    Time  6    Period  Weeks    Status  New    Target Date  10/23/19            Plan - 09/24/19 1254    Clinical Impression Statement  Patient tolerated session well today but noted increased muscle fatigue in BLE with progression to green band for sidestepping and monster walks. Patient was well challenged with tandem stance palloff press and took several attempts to stabilize center of gravity for performing all reps of activity. Patient did well with ambulation over 4 inch hurdles, but did knock down 2 hurdles, though was able to improve speed and ground clearance with further reps. Ended session with treadmill ambulation to monitor and progress patient gait speed as stated per LTGs.    Personal Factors and Comorbidities  Age    Examination-Activity Limitations  Bathing;Sit;Bed Mobility;Sleep;Bend;Squat;Stairs;Carry;Stand;Transfers;Dressing;Locomotion Level;Lift    Examination-Participation Restrictions  Cleaning;Community Activity;Yard Work    Stability/Clinical Decision Making  Stable/Uncomplicated    Rehab Potential  Good    PT Frequency  2x / week    PT Duration  6 weeks    PT Treatment/Interventions  ADLs/Self Care Home Management;Biofeedback;Electrical Stimulation;Cryotherapy;Moist Heat;Balance training;Therapeutic exercise;Manual techniques;Therapeutic activities;Functional mobility training;Orthotic Fit/Training;Stair training;Gait training;DME Instruction;Patient/family education;Neuromuscular re-education;Compression bandaging;Scar mobilization;Passive range of motion;Energy conservation;Splinting;Taping;Vasopneumatic Device;Joint Manipulations    PT Next Visit Plan  Continue to progress hip mobility and balance as tolerated. Work on increasing gait speed with TM ambulation    PT  Home Exercise Plan  09/08/19: mod thomas stretch, bridge; 11/19: sidelying abduction and SLS       Patient will benefit from skilled therapeutic intervention in order to improve the following deficits and impairments:  Abnormal gait, Hypomobility, Decreased strength, Decreased balance, Decreased mobility, Difficulty walking, Decreased range of motion, Impaired flexibility, Improper body mechanics  Visit Diagnosis: Stiffness of right hip, not elsewhere classified  Muscle weakness (generalized)  Stiffness of left hip, not elsewhere classified     Problem List Patient Active Problem List   Diagnosis Date Noted  . Primary osteoarthritis of hip 08/26/2018  . Primary osteoarthritis of left hip 08/04/2018  . Special screening for malignant neoplasms, colon 09/11/2017  . Proximal leg weakness 04/07/2014  . Difficulty in walking(719.7) 04/07/2014  . Impairment of balance 04/07/2014  . Primary localized osteoarthrosis, pelvic region and thigh 03/24/2014   1:00 PM, 09/24/19 Josue Hector PT DPT  Physical Therapist with [facility]  [phone] [place] [address] [place], [place], [address] Phone: [phone]   Fax:  [phone]  Name: DANNYE KENNY MRN: [id number] Date of Birth: February 03, 1946

## 2019-09-29 ENCOUNTER — Other Ambulatory Visit: Payer: Self-pay

## 2019-09-29 ENCOUNTER — Ambulatory Visit (HOSPITAL_COMMUNITY): Payer: Medicare Other | Admitting: Physical Therapy

## 2019-09-29 ENCOUNTER — Encounter (HOSPITAL_COMMUNITY): Payer: Self-pay | Admitting: Physical Therapy

## 2019-09-29 DIAGNOSIS — M25651 Stiffness of right hip, not elsewhere classified: Secondary | ICD-10-CM | POA: Diagnosis not present

## 2019-09-29 DIAGNOSIS — M25652 Stiffness of left hip, not elsewhere classified: Secondary | ICD-10-CM

## 2019-09-29 DIAGNOSIS — M6281 Muscle weakness (generalized): Secondary | ICD-10-CM

## 2019-09-29 NOTE — Therapy (Signed)
[place], [place], [address] Phone: [phone]   Fax:  [phone]  Physical Therapy Treatment  Patient Details  Name: Erik Odonnell MRN: [id number] Date of Birth: 06-26-1946 Referring Provider (PT): Edmonia Lynch MD   Encounter Date: 09/29/2019  PT End of Session - 09/29/19 1037    Visit Number  5    Number of Visits  12    Date for PT Re-Evaluation  10/23/19   Minireassess on 12/11 or visit #10   Authorization Type  [facility] Medicare    Authorization Time Period  09/08/19-10/23/19    Authorization - Visit Number  5    Authorization - Number of Visits  10    PT Start Time  1030    PT Stop Time  1113    PT Time Calculation (min)  43 min    Activity Tolerance  Patient tolerated treatment well    Behavior During Therapy  [facility] for tasks assessed/performed       Past Medical History:  Diagnosis Date  . Arthritis   . Diverticulosis   . Hemifacial spasm    left side affected takes Artane daily;   . History of bronchitis 11/2013  . Hypertension    takes Ziac daily  . Joint pain     Past Surgical History:  Procedure Laterality Date  . CATARACT EXTRACTION, BILATERAL Bilateral 2015, 2016  . COLONOSCOPY    . COLONOSCOPY N/A 12/18/2017   Procedure: COLONOSCOPY;  Surgeon: Rogene Houston, MD;  [facility];  Service: Endoscopy;  Laterality: N/A;  930  . eye muschle surgeries  as a child   x 5  . HERNIA REPAIR Left    as a child-inguinal;right inguinal in 1969  . TOTAL HIP ARTHROPLASTY Right 03/24/2014   Procedure: TOTAL HIP ARTHROPLASTY ANTERIOR APPROACH;  Surgeon: Ninetta Lights, MD;  Location: [place];  Service: Orthopedics;  Laterality: Right;  . TOTAL HIP ARTHROPLASTY Left 08/26/2018   Procedure: LEFT TOTAL HIP ARTHROPLASTY ANTERIOR APPROACH;  Surgeon: Renette Butters, MD;  [facility];  Service: Orthopedics;  Laterality: Left;    There were no vitals filed for this visit.  Subjective  Assessment - 09/29/19 1033    Subjective  Patient reports no new issues.  Says he has been doing HEP and it has been helpful. Patient says he is now able to put his socks on with improved hip mobility. Patient says he was a little sore after last visit but "nothing bad".    Patient Stated Goals  Improve walk and balance    Currently in Pain?  No/denies                       [facility] Adult PT Treatment/Exercise - 09/29/19 0001      Knee/Hip Exercises: Stretches   Knee: Self-Stretch to increase Flexion  Both;10 seconds;5 reps    Knee: Self-Stretch Limitations  on second step at stairs     Gastroc Stretch  Both;3 reps;30 seconds      Knee/Hip Exercises: Aerobic   Tread Mill  5 min, worked up to 35mph      Knee/Hip Exercises: Field seismologist for Strengthening   [employer]  2 x 15 both legs; 50#      Knee/Hip Exercises: Standing   Other Standing Knee Exercises  staggered stance with palloff press; each leg back; x20 ead red; band sidestepping 3RT, green; Band monster walks forward/ retro; 2  RT, green    Other Standing Knee Exercises  weighted walkouts 50# x10; ladder drills 3 way, forward, lateral and diagonals, 2 RT each               PT Short Term Goals - 09/08/19 1220      PT SHORT TERM GOAL #1   Title  Patient will be independent with initial HEP to improve functional outcomes    Time  3    Period  Weeks    Status  New    Target Date  10/02/19        PT Long Term Goals - 09/08/19 1221      PT LONG TERM GOAL #1   Title  Patient will be able to perform SLS bilaterally on compliant surface to improve stability funcitonal mobility on uneven surfaces (in yard)    Time  6    Period  Weeks    Status  New    Target Date  10/23/19      PT LONG TERM GOAL #2   Title  Patient will have greater then or equal to 4+/5 MMT in BLE to improve funcitonal mobility and ability to return to gym.    Time  6    Period  Weeks    Status  New    Target Date  10/23/19       PT LONG TERM GOAL #3   Title  Patient will improve bilateral hip flexion to >115 and hip extension >10 degrees in order to improve stride length and increase gait speed, and potentially return to jogging.    Time  6    Period  Weeks    Status  New    Target Date  10/23/19            Plan - 09/29/19 1114    Clinical Impression Statement  Patient is progressing well to LTGs. Patient was challenged with tandem stance palloff press and required intermittent staggered stance for balance. Patient educated on purpose and function of added ladder agility drills. Patient required verbal cues for sequencing and timing of movements for improved gait speed. Patient was able to progress TM walking to 3 mph for 5 minutes with good biomechanics, but did note mild fatigue afterward.    Personal Factors and Comorbidities  Age    Examination-Activity Limitations  Bathing;Sit;Bed Mobility;Sleep;Bend;Squat;Stairs;Carry;Stand;Transfers;Dressing;Locomotion Level;Lift    Examination-Participation Restrictions  Cleaning;Community Activity;Yard Work    Stability/Clinical Decision Making  Stable/Uncomplicated    Rehab Potential  Good    PT Frequency  2x / week    PT Duration  6 weeks    PT Treatment/Interventions  ADLs/Self Care Home Management;Biofeedback;Electrical Stimulation;Cryotherapy;Moist Heat;Balance training;Therapeutic exercise;Manual techniques;Therapeutic activities;Functional mobility training;Orthotic Fit/Training;Stair training;Gait training;DME Instruction;Patient/family education;Neuromuscular re-education;Compression bandaging;Scar mobilization;Passive range of motion;Energy conservation;Splinting;Taping;Vasopneumatic Device;Joint Manipulations    PT Next Visit Plan  Continue to progress gait speed and BLE agility activity as tolerated. Continue ladder drills.    PT Home Exercise Plan  09/08/19: mod thomas stretch, bridge; 11/19: sidelying abduction and SLS       Patient will benefit from  skilled therapeutic intervention in order to improve the following deficits and impairments:  Abnormal gait, Hypomobility, Decreased strength, Decreased balance, Decreased mobility, Difficulty walking, Decreased range of motion, Impaired flexibility, Improper body mechanics  Visit Diagnosis: Stiffness of right hip, not elsewhere classified  Muscle weakness (generalized)  Stiffness of left hip, not elsewhere classified     Problem List Patient Active Problem List   Diagnosis Date  Noted  . Primary osteoarthritis of hip 08/26/2018  . Primary osteoarthritis of left hip 08/04/2018  . Special screening for malignant neoplasms, colon 09/11/2017  . Proximal leg weakness 04/07/2014  . Difficulty in walking(719.7) 04/07/2014  . Impairment of balance 04/07/2014  . Primary localized osteoarthrosis, pelvic region and thigh 03/24/2014    11:16 AM, 09/29/19 Josue Hector PT DPT  Physical Therapist with [facility]  [phone] [place] [address] [place], [place], [address] Phone: [phone]   Fax:  [phone]  Name: JAYVIAN PESTA MRN: [id number] Date of Birth: 1946-02-29

## 2019-10-01 ENCOUNTER — Encounter (HOSPITAL_COMMUNITY): Payer: Self-pay | Admitting: Physical Therapy

## 2019-10-01 ENCOUNTER — Ambulatory Visit (HOSPITAL_COMMUNITY): Payer: Medicare Other | Admitting: Physical Therapy

## 2019-10-01 ENCOUNTER — Other Ambulatory Visit: Payer: Self-pay

## 2019-10-01 DIAGNOSIS — M25651 Stiffness of right hip, not elsewhere classified: Secondary | ICD-10-CM

## 2019-10-01 DIAGNOSIS — M25652 Stiffness of left hip, not elsewhere classified: Secondary | ICD-10-CM

## 2019-10-01 DIAGNOSIS — M6281 Muscle weakness (generalized): Secondary | ICD-10-CM

## 2019-10-01 NOTE — Therapy (Signed)
[place], [place], [address] Phone: [phone]   Fax:  [phone]  Physical Therapy Treatment/ Progress note  Patient Details  Name: Erik Odonnell MRN: [id number] Date of Birth: Jul 06, 1946 Referring Provider (PT): Edmonia Lynch MD   Encounter Date: 10/01/2019   Progress Note Reporting Period 09/08/19 to 10/01/19  See note below for Objective Data and Assessment of Progress/Goals.       PT End of Session - 10/01/19 0907    Visit Number  6    Number of Visits  12    Date for PT Re-Evaluation  10/23/19   Progress note completed 12/10   Authorization Type  [employer] Medicare    Authorization Time Period  09/08/19-10/23/19    Authorization - Visit Number  1    Authorization - Number of Visits  10    PT Start Time  0905    PT Stop Time  0950    PT Time Calculation (min)  45 min    Activity Tolerance  Patient tolerated treatment well    Behavior During Therapy  [employer] for tasks assessed/performed       Past Medical History:  Diagnosis Date  . Arthritis   . Diverticulosis   . Hemifacial spasm    left side affected takes Artane daily;   . History of bronchitis 11/2013  . Hypertension    takes Ziac daily  . Joint pain     Past Surgical History:  Procedure Laterality Date  . CATARACT EXTRACTION, BILATERAL Bilateral 2015, 2016  . COLONOSCOPY    . COLONOSCOPY N/A 12/18/2017   Procedure: COLONOSCOPY;  Surgeon: Rogene Houston, MD;  [facility];  Service: Endoscopy;  Laterality: N/A;  930  . eye muschle surgeries  as a child   x 5  . HERNIA REPAIR Left    as a child-inguinal;right inguinal in 1969  . TOTAL HIP ARTHROPLASTY Right 03/24/2014   Procedure: TOTAL HIP ARTHROPLASTY ANTERIOR APPROACH;  Surgeon: Ninetta Lights, MD;  Location: [place];  Service: Orthopedics;  Laterality: Right;  . TOTAL HIP ARTHROPLASTY Left 08/26/2018   Procedure: LEFT TOTAL HIP ARTHROPLASTY ANTERIOR APPROACH;  Surgeon:  Renette Butters, MD;  [facility];  Service: Orthopedics;  Laterality: Left;    There were no vitals filed for this visit.  Subjective Assessment - 10/01/19 0907    Subjective  Patient says things are going well and has no new issues. Patient reports no pain currently, "just the stiffness I have when I wake up".    Patient Stated Goals  Improve walk and balance    Currently in Pain?  No/denies         [facility] PT Assessment - 10/01/19 0001      Assessment   Medical Diagnosis  Bilateral hip stiffness    Referring Provider (PT)  Edmonia Lynch MD    Onset Date/Surgical Date  09/05/18    Prior Therapy  Yes       Precautions   Precautions  None      Restrictions   Weight Bearing Restrictions  No      Balance Screen   Has the patient fallen in the past 6 months  No      [place] residence      Prior Function   Level of Independence  Independent      AROM   Right Hip Extension  10  Right Hip Flexion  95   wearing restricitve jeans    Left Hip Extension  12    Left Hip Flexion  105   wearing restrictive jeans     Strength   Right Hip Flexion  5/5    Right Hip Extension  4+/5   was 4   Right Hip ABduction  4+/5   was 4   Left Hip Flexion  5/5    Left Hip Extension  5/5   was 4   Left Hip ABduction  4+/5   was 4-   Right Knee Flexion  5/5    Right Knee Extension  5/5    Left Knee Flexion  5/5    Left Knee Extension  5/5    Right Ankle Dorsiflexion  5/5    Left Ankle Dorsiflexion  5/5                   [facility] Adult PT Treatment/Exercise - 10/01/19 0001      Knee/Hip Exercises: Stretches   Knee: Self-Stretch to increase Flexion  Both;10 seconds;5 reps    Knee: Self-Stretch Limitations  on second step at stairs     Gastroc Stretch  Both;3 reps;30 seconds    Gastroc Stretch Limitations  at stairs      Knee/Hip Exercises: Aerobic   Elliptical  5 minutes EOS, working up to 55 steps per min      Knee/Hip  Exercises: Standing   SLS  3 x 10" with intermittent HHA on foam     Other Standing Knee Exercises  band sidestepping 3RT, green; Band monster walks forward/ retro; 2 RT, green    Other Standing Knee Exercises  ladder drills 3 way, forward, lateral and diagonals, 2 RT each               PT Short Term Goals - 10/01/19 0935      PT SHORT TERM GOAL #1   Title  Patient will be independent with initial HEP to improve functional outcomes    Time  3    Period  Weeks    Status  Achieved    Target Date  10/02/19        PT Long Term Goals - 10/01/19 0935      PT LONG TERM GOAL #1   Title  Patient will be able to perform SLS bilaterally on compliant surface to improve stability funcitonal mobility on uneven surfaces (in yard)    Baseline  Current: < 5 sec bilaterally    Time  6    Period  Weeks    Status  On-going      PT LONG TERM GOAL #2   Title  Patient will have greater then or equal to 4+/5 MMT in BLE to improve funcitonal mobility and ability to return to gym.    Time  6    Period  Weeks    Status  Achieved      PT LONG TERM GOAL #3   Title  Patient will improve bilateral hip flexion to >115 and hip extension >10 degrees in order to improve stride length and increase gait speed, and potentially return to jogging.    Baseline  Improved hip extension, but still limited in hip flexion, likely limited today due to wearing tighter fitting jeans    Time  6    Period  Weeks    Status  Partially Met            Plan -  10/01/19 1008    Clinical Impression Statement  Patient progressing well to LTGs. Progress note performed today. Patient with improved BLE strength but remains limited in bilateral hip AROM which continues to impact gait quality and speed. ROM measure were skewed this visit due to patient wearing tight fitting jean pants. Patient instructed to wear more athletic apparel for future visits. Patient still having difficulty with isolated balance testing/ static  positions, and requires verbal cueing for sequencing of steps for increased speed with agility tasks such as ladder drills. Patient was able to progress cadence to 55 steps per minute using elliptical at end of session today.    Personal Factors and Comorbidities  Age    Examination-Activity Limitations  Bathing;Sit;Bed Mobility;Sleep;Bend;Squat;Stairs;Carry;Stand;Transfers;Dressing;Locomotion Level;Lift    Examination-Participation Restrictions  Cleaning;Community Activity;Yard Work    Stability/Clinical Decision Making  Stable/Uncomplicated    Rehab Potential  Good    PT Frequency  2x / week    PT Duration  3 weeks    PT Treatment/Interventions  ADLs/Self Care Home Management;Biofeedback;Electrical Stimulation;Cryotherapy;Moist Heat;Balance training;Therapeutic exercise;Manual techniques;Therapeutic activities;Functional mobility training;Orthotic Fit/Training;Stair training;Gait training;DME Instruction;Patient/family education;Neuromuscular re-education;Compression bandaging;Scar mobilization;Passive range of motion;Energy conservation;Splinting;Taping;Vasopneumatic Device;Joint Manipulations    PT Next Visit Plan  Continue to progress gait speed and BLE agility activity as tolerated. Progress cadence on eliptical to 60 steps per minute. Focus on SLS balance activity next visit.    PT Home Exercise Plan  09/08/19: mod thomas stretch, bridge; 11/19: sidelying abduction and SLS       Patient will benefit from skilled therapeutic intervention in order to improve the following deficits and impairments:  Abnormal gait, Hypomobility, Decreased strength, Decreased balance, Decreased mobility, Difficulty walking, Decreased range of motion, Impaired flexibility, Improper body mechanics  Visit Diagnosis: Stiffness of right hip, not elsewhere classified  Muscle weakness (generalized)  Stiffness of left hip, not elsewhere classified     Problem List Patient Active Problem List   Diagnosis Date  Noted  . Primary osteoarthritis of hip 08/26/2018  . Primary osteoarthritis of left hip 08/04/2018  . Special screening for malignant neoplasms, colon 09/11/2017  . Proximal leg weakness 04/07/2014  . Difficulty in walking(719.7) 04/07/2014  . Impairment of balance 04/07/2014  . Primary localized osteoarthrosis, pelvic region and thigh 03/24/2014   10:17 AM, 10/01/19 Josue Hector PT DPT  Physical Therapist with [facility]  [phone] [place] [address] [place], [place], [address] Phone: [phone]   Fax:  [phone]  Name: Erik Odonnell MRN: [id number] Date of Birth: 1946-01-25

## 2019-10-06 ENCOUNTER — Other Ambulatory Visit: Payer: Self-pay

## 2019-10-06 ENCOUNTER — Ambulatory Visit (HOSPITAL_COMMUNITY): Payer: Medicare Other | Admitting: Physical Therapy

## 2019-10-06 ENCOUNTER — Encounter (HOSPITAL_COMMUNITY): Payer: Self-pay | Admitting: Physical Therapy

## 2019-10-06 ENCOUNTER — Ambulatory Visit: Payer: Medicare Other | Attending: Internal Medicine

## 2019-10-06 DIAGNOSIS — M6281 Muscle weakness (generalized): Secondary | ICD-10-CM

## 2019-10-06 DIAGNOSIS — Z20822 Contact with and (suspected) exposure to covid-19: Secondary | ICD-10-CM

## 2019-10-06 DIAGNOSIS — M25652 Stiffness of left hip, not elsewhere classified: Secondary | ICD-10-CM

## 2019-10-06 DIAGNOSIS — M25651 Stiffness of right hip, not elsewhere classified: Secondary | ICD-10-CM

## 2019-10-06 NOTE — Therapy (Signed)
[place], [place], [address] Phone: [phone]   Fax:  [phone]  Physical Therapy Treatment  Patient Details  Name: Erik Odonnell MRN: [id number] Date of Birth: 1946/03/10 Referring Provider (PT): Edmonia Lynch MD   Encounter Date: 10/06/2019  PT End of Session - 10/06/19 0905    Visit Number  7    Number of Visits  12    Date for PT Re-Evaluation  10/23/19   Progress note completed 12/10   Authorization Type  [employer] Medicare    Authorization Time Period  09/08/19-10/23/19    Authorization - Visit Number  2    Authorization - Number of Visits  10    PT Start Time  0900    PT Stop Time  0945    PT Time Calculation (min)  45 min    Equipment Utilized During Treatment  Gait belt    Activity Tolerance  Patient tolerated treatment well    Behavior During Therapy  [facility] for tasks assessed/performed       Past Medical History:  Diagnosis Date  . Arthritis   . Diverticulosis   . Hemifacial spasm    left side affected takes Artane daily;   . History of bronchitis 11/2013  . Hypertension    takes Ziac daily  . Joint pain     Past Surgical History:  Procedure Laterality Date  . CATARACT EXTRACTION, BILATERAL Bilateral 2015, 2016  . COLONOSCOPY    . COLONOSCOPY N/A 12/18/2017   Procedure: COLONOSCOPY;  Surgeon: Rogene Houston, MD;  [facility];  Service: Endoscopy;  Laterality: N/A;  930  . eye muschle surgeries  as a child   x 5  . HERNIA REPAIR Left    as a child-inguinal;right inguinal in 1969  . TOTAL HIP ARTHROPLASTY Right 03/24/2014   Procedure: TOTAL HIP ARTHROPLASTY ANTERIOR APPROACH;  Surgeon: Ninetta Lights, MD;  Location: [place];  Service: Orthopedics;  Laterality: Right;  . TOTAL HIP ARTHROPLASTY Left 08/26/2018   Procedure: LEFT TOTAL HIP ARTHROPLASTY ANTERIOR APPROACH;  Surgeon: Renette Butters, MD;  [facility];  Service: Orthopedics;  Laterality: Left;    There were no  vitals filed for this visit.  Subjective Assessment - 10/06/19 0905    Subjective  Patient states he is doing well. Pateint says he was able to break into a short run, about 50 yards, the other day.    Patient Stated Goals  Improve walk and balance    Currently in Pain?  No/denies                       [facility] Adult PT Treatment/Exercise - 10/06/19 0001      Knee/Hip Exercises: Stretches   Knee: Self-Stretch to increase Flexion  Both;10 seconds;5 reps    Knee: Self-Stretch Limitations  on second step at stairs     Gastroc Stretch  Both;3 reps;30 seconds    Gastroc Stretch Limitations  at stairs      Knee/Hip Exercises: Aerobic   Elliptical  5 minutes EOS, working up to 55 steps per min    Recumbent Bike  4 min warm up lv 3      Knee/Hip Exercises: Cytogeneticist walkouts, 50#, x10      Knee/Hip Exercises: Standing   Other Standing Knee Exercises  band sidestepping 3RT, green; Band monster walks forward/ retro; 2 RT, green  Other Standing Knee Exercises  ladder drills 3 way, forward, lateral and diagonals, 2 RT each          Balance Exercises - 10/06/19 0940      Balance Exercises: Standing   Tandem Stance  Eyes open;Foam/compliant surface;3 reps;30 secs    SLS  Eyes open;Solid surface;3 reps;10 secs    Step Over Hurdles / Cones  3 RT, 15", over 4 6inch hurdles           PT Short Term Goals - 10/01/19 0935      PT SHORT TERM GOAL #1   Title  Patient will be independent with initial HEP to improve functional outcomes    Time  3    Period  Weeks    Status  Achieved    Target Date  10/02/19        PT Long Term Goals - 10/01/19 0935      PT LONG TERM GOAL #1   Title  Patient will be able to perform SLS bilaterally on compliant surface to improve stability funcitonal mobility on uneven surfaces (in yard)    Baseline  Current: < 5 sec bilaterally    Time  6    Period  Weeks    Status  On-going      PT LONG  TERM GOAL #2   Title  Patient will have greater then or equal to 4+/5 MMT in BLE to improve funcitonal mobility and ability to return to gym.    Time  6    Period  Weeks    Status  Achieved      PT LONG TERM GOAL #3   Title  Patient will improve bilateral hip flexion to >115 and hip extension >10 degrees in order to improve stride length and increase gait speed, and potentially return to jogging.    Baseline  Improved hip extension, but still limited in hip flexion, likely limited today due to wearing tighter fitting jeans    Time  6    Period  Weeks    Status  Partially Met            Plan - 10/06/19 0953    Clinical Impression Statement  Patient tolerated session well today. Patient was able to improve cadence with ladder agility drills wit minimal verbal cueing. Patient continues to have some difficulty with static balance positions, especially LT SLS. Patient encouraged to continue practicing this position with HEP at kitchen counter to improve single leg balance and stability, for improved carry over of dynamic activity. Patient did well with approximation of obstacle height during step over 4 inch hurdle drills. Patient cued on sequencing and cadence/ speed. Patient did note mild cardiovascular fatigue at end of session, but improved with rest.    Personal Factors and Comorbidities  Age    Examination-Activity Limitations  Bathing;Sit;Bed Mobility;Sleep;Bend;Squat;Stairs;Carry;Stand;Transfers;Dressing;Locomotion Level;Lift    Examination-Participation Restrictions  Cleaning;Community Activity;Yard Work    Stability/Clinical Decision Making  Stable/Uncomplicated    Rehab Potential  Good    PT Frequency  2x / week    PT Duration  3 weeks    PT Treatment/Interventions  ADLs/Self Care Home Management;Biofeedback;Electrical Stimulation;Cryotherapy;Moist Heat;Balance training;Therapeutic exercise;Manual techniques;Therapeutic activities;Functional mobility training;Orthotic  Fit/Training;Stair training;Gait training;DME Instruction;Patient/family education;Neuromuscular re-education;Compression bandaging;Scar mobilization;Passive range of motion;Energy conservation;Splinting;Taping;Vasopneumatic Device;Joint Manipulations    PT Next Visit Plan  Continue to progress gait speed and BLE agility activity as tolerated. Progress cadence on eliptical to 60 steps per minute. Focus on SLS balance activity next visit.  PT Home Exercise Plan  09/08/19: mod thomas stretch, bridge; 11/19: sidelying abduction and SLS       Patient will benefit from skilled therapeutic intervention in order to improve the following deficits and impairments:  Abnormal gait, Hypomobility, Decreased strength, Decreased balance, Decreased mobility, Difficulty walking, Decreased range of motion, Impaired flexibility, Improper body mechanics  Visit Diagnosis: Stiffness of right hip, not elsewhere classified  Muscle weakness (generalized)  Stiffness of left hip, not elsewhere classified     Problem List Patient Active Problem List   Diagnosis Date Noted  . Primary osteoarthritis of hip 08/26/2018  . Primary osteoarthritis of left hip 08/04/2018  . Special screening for malignant neoplasms, colon 09/11/2017  . Proximal leg weakness 04/07/2014  . Difficulty in walking(719.7) 04/07/2014  . Impairment of balance 04/07/2014  . Primary localized osteoarthrosis, pelvic region and thigh 03/24/2014   9:54 AM, 10/06/19 Josue Hector PT DPT  Physical Therapist with [facility]  [phone] [place], [place], [address] Phone: [phone]   Fax:  [phone]  Name: Erik Odonnell MRN: [id number] Date of Birth: [birth year]

## 2019-10-07 LAB — NOVEL CORONAVIRUS, NAA: SARS-CoV-2, NAA: NOT DETECTED

## 2019-10-08 ENCOUNTER — Ambulatory Visit (HOSPITAL_COMMUNITY): Payer: Medicare Other | Admitting: Physical Therapy

## 2019-10-08 ENCOUNTER — Encounter (HOSPITAL_COMMUNITY): Payer: Self-pay | Admitting: Physical Therapy

## 2019-10-08 ENCOUNTER — Other Ambulatory Visit: Payer: Self-pay

## 2019-10-08 DIAGNOSIS — M25652 Stiffness of left hip, not elsewhere classified: Secondary | ICD-10-CM

## 2019-10-08 DIAGNOSIS — M25651 Stiffness of right hip, not elsewhere classified: Secondary | ICD-10-CM

## 2019-10-08 DIAGNOSIS — M6281 Muscle weakness (generalized): Secondary | ICD-10-CM

## 2019-10-08 NOTE — Therapy (Signed)
[place], [place], [address] Phone: [phone]   Fax:  [phone]  Physical Therapy Treatment  Patient Details  Name: Erik Odonnell MRN: [id number] Date of Birth: Jan 19, 1946 Referring Provider (PT): Edmonia Lynch MD   Encounter Date: 10/08/2019  PT End of Session - 10/08/19 0912    Visit Number  8    Number of Visits  12    Date for PT Re-Evaluation  10/23/19   Progress note completed 12/10   Authorization Type  [employer] Medicare    Authorization Time Period  09/08/19-10/23/19    Authorization - Visit Number  3    Authorization - Number of Visits  10    PT Start Time  0910    PT Stop Time  1000    PT Time Calculation (min)  50 min    Equipment Utilized During Treatment  Gait belt    Activity Tolerance  Patient tolerated treatment well    Behavior During Therapy  [facility] for tasks assessed/performed       Past Medical History:  Diagnosis Date  . Arthritis   . Diverticulosis   . Hemifacial spasm    left side affected takes Artane daily;   . History of bronchitis 11/2013  . Hypertension    takes Ziac daily  . Joint pain     Past Surgical History:  Procedure Laterality Date  . CATARACT EXTRACTION, BILATERAL Bilateral 2015, 2016  . COLONOSCOPY    . COLONOSCOPY N/A 12/18/2017   Procedure: COLONOSCOPY;  Surgeon: Rogene Houston, MD;  [facility];  Service: Endoscopy;  Laterality: N/A;  930  . eye muschle surgeries  as a child   x 5  . HERNIA REPAIR Left    as a child-inguinal;right inguinal in 1969  . TOTAL HIP ARTHROPLASTY Right 03/24/2014   Procedure: TOTAL HIP ARTHROPLASTY ANTERIOR APPROACH;  Surgeon: Ninetta Lights, MD;  Location: [place];  Service: Orthopedics;  Laterality: Right;  . TOTAL HIP ARTHROPLASTY Left 08/26/2018   Procedure: LEFT TOTAL HIP ARTHROPLASTY ANTERIOR APPROACH;  Surgeon: Renette Butters, MD;  [facility];  Service: Orthopedics;  Laterality: Left;    There were no  vitals filed for this visit.  Subjective Assessment - 10/08/19 0915    Subjective  Patient says hips are doing good today. Patient says he was tired after activity last session, and noted that single leg balance activity continues to be a challenge for him.  Patient reports no pain currently    Patient Stated Goals  Improve walk and balance    Currently in Pain?  No/denies                       [facility] Adult PT Treatment/Exercise - 10/08/19 0001      Knee/Hip Exercises: Stretches   Knee: Self-Stretch to increase Flexion  Both;10 seconds;5 reps    Knee: Self-Stretch Limitations  on second step at stairs     Gastroc Stretch  Both;3 reps;30 seconds    Gastroc Stretch Limitations  at stairs      Knee/Hip Exercises: Aerobic   Elliptical  5 minutes EOS, working up to 60 steps per min    Recumbent Bike  4 min warm up lv 3      Knee/Hip Exercises: Machines for Strengthening   Cybex Leg Press  2 x 15 both legs; 50#      Knee/Hip Exercises: Standing   Other Standing Knee  Exercises  --    Other Standing Knee Exercises  ladder drills 3 way, forward, lateral and diagonals, 2 RT each          Balance Exercises - 10/08/19 0954      Balance Exercises: Standing   Tandem Stance  Eyes open;Foam/compliant surface;3 reps;30 secs    SLS  Eyes open;Solid surface;3 reps;Intermittent upper extremity support;15 secs    Tandem Gait  Forward;Foam/compliant surface;2 reps    Sidestepping  Foam/compliant support;2 reps          PT Short Term Goals - 10/01/19 0935      PT SHORT TERM GOAL #1   Title  Patient will be independent with initial HEP to improve functional outcomes    Time  3    Period  Weeks    Status  Achieved    Target Date  10/02/19        PT Long Term Goals - 10/01/19 0935      PT LONG TERM GOAL #1   Title  Patient will be able to perform SLS bilaterally on compliant surface to improve stability funcitonal mobility on uneven surfaces (in yard)    Baseline   Current: < 5 sec bilaterally    Time  6    Period  Weeks    Status  On-going      PT LONG TERM GOAL #2   Title  Patient will have greater then or equal to 4+/5 MMT in BLE to improve funcitonal mobility and ability to return to gym.    Time  6    Period  Weeks    Status  Achieved      PT LONG TERM GOAL #3   Title  Patient will improve bilateral hip flexion to >115 and hip extension >10 degrees in order to improve stride length and increase gait speed, and potentially return to jogging.    Baseline  Improved hip extension, but still limited in hip flexion, likely limited today due to wearing tighter fitting jeans    Time  6    Period  Weeks    Status  Partially Met            Plan - 10/08/19 1045    Clinical Impression Statement  Patient continues to show good progress to LTGs. Patient was well challenged with added tandem gait and sidestepping on foam balance beam. Patient still has difficulty with SLS but was able to improve to some degree with min verbal and tactile cueing. Patient shows good return for cadence with ladder drills, with min cueing for timing. Patient able to progress to 60 steps per minute on elliptical but with noted cardiovascular fatigue.    Personal Factors and Comorbidities  Age    Examination-Activity Limitations  Bathing;Sit;Bed Mobility;Sleep;Bend;Squat;Stairs;Carry;Stand;Transfers;Dressing;Locomotion Level;Lift    Examination-Participation Restrictions  Cleaning;Community Activity;Yard Work    Stability/Clinical Decision Making  Stable/Uncomplicated    Rehab Potential  Good    PT Frequency  2x / week    PT Duration  3 weeks    PT Treatment/Interventions  ADLs/Self Care Home Management;Biofeedback;Electrical Stimulation;Cryotherapy;Moist Heat;Balance training;Therapeutic exercise;Manual techniques;Therapeutic activities;Functional mobility training;Orthotic Fit/Training;Stair training;Gait training;DME Instruction;Patient/family education;Neuromuscular  re-education;Compression bandaging;Scar mobilization;Passive range of motion;Energy conservation;Splinting;Taping;Vasopneumatic Device;Joint Manipulations    PT Next Visit Plan  Continue to progress coordination, agility and dynamic balance activity to improve gait speed and functional mobility.    PT Home Exercise Plan  09/08/19: mod thomas stretch, bridge; 11/19: sidelying abduction and SLS  Patient will benefit from skilled therapeutic intervention in order to improve the following deficits and impairments:  Abnormal gait, Hypomobility, Decreased strength, Decreased balance, Decreased mobility, Difficulty walking, Decreased range of motion, Impaired flexibility, Improper body mechanics  Visit Diagnosis: Stiffness of right hip, not elsewhere classified  Muscle weakness (generalized)  Stiffness of left hip, not elsewhere classified     Problem List Patient Active Problem List   Diagnosis Date Noted  . Primary osteoarthritis of hip 08/26/2018  . Primary osteoarthritis of left hip 08/04/2018  . Special screening for malignant neoplasms, colon 09/11/2017  . Proximal leg weakness 04/07/2014  . Difficulty in walking(719.7) 04/07/2014  . Impairment of balance 04/07/2014  . Primary localized osteoarthrosis, pelvic region and thigh 03/24/2014    10:47 AM, 10/08/19 Josue Hector PT DPT  Physical Therapist with [facility]  [phone] [place], [place], [address] Phone: [phone]   Fax:  [phone]  Name: Erik Odonnell MRN: [id number] Date of Birth: 22-Oct-1946

## 2019-10-12 ENCOUNTER — Encounter (HOSPITAL_COMMUNITY): Payer: Self-pay | Admitting: Physical Therapy

## 2019-10-12 ENCOUNTER — Ambulatory Visit (HOSPITAL_COMMUNITY): Payer: Medicare Other | Admitting: Physical Therapy

## 2019-10-12 ENCOUNTER — Other Ambulatory Visit: Payer: Self-pay

## 2019-10-12 DIAGNOSIS — M6281 Muscle weakness (generalized): Secondary | ICD-10-CM

## 2019-10-12 DIAGNOSIS — M25651 Stiffness of right hip, not elsewhere classified: Secondary | ICD-10-CM | POA: Diagnosis not present

## 2019-10-12 DIAGNOSIS — M25652 Stiffness of left hip, not elsewhere classified: Secondary | ICD-10-CM

## 2019-10-12 NOTE — Therapy (Signed)
[place], [place], [address] Phone: [phone]   Fax:  [phone]  Physical Therapy Treatment  Patient Details  Name: Erik Odonnell MRN: [id number] Date of Birth: [birth year] Referring Provider (PT): Edmonia Lynch MD   Encounter Date: 10/12/2019  PT End of Session - 10/12/19 0910    Visit Number  9    Number of Visits  12    Date for PT Re-Evaluation  10/23/19   Progress note completed 12/10   Authorization Type  [employer] Medicare    Authorization Time Period  09/08/19-10/23/19    Authorization - Visit Number  4    Authorization - Number of Visits  10    PT Start Time  0905    PT Stop Time  0950    PT Time Calculation (min)  45 min    Equipment Utilized During Treatment  Gait belt    Activity Tolerance  Patient tolerated treatment well    Behavior During Therapy  [facility] for tasks assessed/performed       Past Medical History:  Diagnosis Date  . Arthritis   . Diverticulosis   . Hemifacial spasm    left side affected takes Artane daily;   . History of bronchitis 11/2013  . Hypertension    takes Ziac daily  . Joint pain     Past Surgical History:  Procedure Laterality Date  . CATARACT EXTRACTION, BILATERAL Bilateral 2015, 2016  . COLONOSCOPY    . COLONOSCOPY N/A 12/18/2017   Procedure: COLONOSCOPY;  Surgeon: Rogene Houston, MD;  [facility];  Service: Endoscopy;  Laterality: N/A;  930  . eye muschle surgeries  as a child   x 5  . HERNIA REPAIR Left    as a child-inguinal;right inguinal in 1969  . TOTAL HIP ARTHROPLASTY Right 03/24/2014   Procedure: TOTAL HIP ARTHROPLASTY ANTERIOR APPROACH;  Surgeon: Ninetta Lights, MD;  Location: [place];  Service: Orthopedics;  Laterality: Right;  . TOTAL HIP ARTHROPLASTY Left 08/26/2018   Procedure: LEFT TOTAL HIP ARTHROPLASTY ANTERIOR APPROACH;  Surgeon: Renette Butters, MD;  [facility];  Service: Orthopedics;  Laterality: Left;    There were no  vitals filed for this visit.  Subjective Assessment - 10/12/19 0908    Subjective  Patient says "everything is good", reports no pain currently    Patient Stated Goals  Improve walk and balance    Currently in Pain?  No/denies                       [facility] Adult PT Treatment/Exercise - 10/12/19 0001      Knee/Hip Exercises: Stretches   Knee: Self-Stretch to increase Flexion  Both;10 seconds;5 reps    Knee: Self-Stretch Limitations  on second step at stairs     Gastroc Stretch  Both;3 reps;30 seconds    Gastroc Stretch Limitations  at stairs      Knee/Hip Exercises: Aerobic   Elliptical  4 minutes EOS, working up to 60 steps per min    Recumbent Bike  4 min warm up lv 3      Knee/Hip Exercises: Field seismologist for Data processing manager walkouts, 50#, x10      Knee/Hip Exercises: Standing   Functional Squat  2 sets;10 reps    Functional Squat Limitations  front of chair for mechanics    Stairs  1 RT; reciprocal; no rail    Other Standing  Knee Exercises  step up on 6 inch step with opposite high knee raise; 5 sec hold, x5 each; step over hurdles, 12 inch, 2 RT     Other Standing Knee Exercises  ladder drills 3 way, forward, lateral and diagonals, 2 RT each               PT Short Term Goals - 10/01/19 0935      PT SHORT TERM GOAL #1   Title  Patient will be independent with initial HEP to improve functional outcomes    Time  3    Period  Weeks    Status  Achieved    Target Date  10/02/19        PT Long Term Goals - 10/01/19 0935      PT LONG TERM GOAL #1   Title  Patient will be able to perform SLS bilaterally on compliant surface to improve stability funcitonal mobility on uneven surfaces (in yard)    Baseline  Current: < 5 sec bilaterally    Time  6    Period  Weeks    Status  On-going      PT LONG TERM GOAL #2   Title  Patient will have greater then or equal to 4+/5 MMT in BLE to improve funcitonal mobility and ability to return to  gym.    Time  6    Period  Weeks    Status  Achieved      PT LONG TERM GOAL #3   Title  Patient will improve bilateral hip flexion to >115 and hip extension >10 degrees in order to improve stride length and increase gait speed, and potentially return to jogging.    Baseline  Improved hip extension, but still limited in hip flexion, likely limited today due to wearing tighter fitting jeans    Time  6    Period  Weeks    Status  Partially Met            Plan - 10/12/19 1019    Clinical Impression Statement  Patient making good progress to LTGs. Patient with mild continued limitation in endurance and single limb balance activity, but is steadily improving. Modified SLS to step ups with hold this visit to work more on single limb stability with functional movements. Patient showed good return for sequence and cadence with ladder drills. Patient also with good return during added 12 inch hurdle step overs, showing good spatial awareness, and ground clearance.    Personal Factors and Comorbidities  Age    Examination-Activity Limitations  Bathing;Sit;Bed Mobility;Sleep;Bend;Squat;Stairs;Carry;Stand;Transfers;Dressing;Locomotion Level;Lift    Examination-Participation Restrictions  Cleaning;Community Activity;Yard Work    Stability/Clinical Decision Making  Stable/Uncomplicated    Rehab Potential  Good    PT Frequency  2x / week    PT Duration  3 weeks    PT Treatment/Interventions  ADLs/Self Care Home Management;Biofeedback;Electrical Stimulation;Cryotherapy;Moist Heat;Balance training;Therapeutic exercise;Manual techniques;Therapeutic activities;Functional mobility training;Orthotic Fit/Training;Stair training;Gait training;DME Instruction;Patient/family education;Neuromuscular re-education;Compression bandaging;Scar mobilization;Passive range of motion;Energy conservation;Splinting;Taping;Vasopneumatic Device;Joint Manipulations    PT Next Visit Plan  Continue to progress coordination,  agility and dynamic balance activity to improve gait speed and functional mobility.    PT Home Exercise Plan  09/08/19: mod thomas stretch, bridge; 11/19: sidelying abduction and SLS       Patient will benefit from skilled therapeutic intervention in order to improve the following deficits and impairments:  Abnormal gait, Hypomobility, Decreased strength, Decreased balance, Decreased mobility, Difficulty walking, Decreased range of motion, Impaired flexibility,  Improper body mechanics  Visit Diagnosis: Stiffness of right hip, not elsewhere classified  Muscle weakness (generalized)  Stiffness of left hip, not elsewhere classified     Problem List Patient Active Problem List   Diagnosis Date Noted  . Primary osteoarthritis of hip 08/26/2018  . Primary osteoarthritis of left hip 08/04/2018  . Special screening for malignant neoplasms, colon 09/11/2017  . Proximal leg weakness 04/07/2014  . Difficulty in walking(719.7) 04/07/2014  . Impairment of balance 04/07/2014  . Primary localized osteoarthrosis, pelvic region and thigh 03/24/2014    10:22 AM, 10/12/19 Josue Hector PT DPT  Physical Therapist with [facility]  [phone] [place], [place], [address] Phone: [phone]   Fax:  [phone]  Name: Erik Odonnell MRN: [id number] Date of Birth: 03-07-1946

## 2019-10-13 ENCOUNTER — Encounter (HOSPITAL_COMMUNITY): Payer: Self-pay | Admitting: Physical Therapy

## 2019-10-13 ENCOUNTER — Ambulatory Visit (HOSPITAL_COMMUNITY): Payer: Medicare Other | Admitting: Physical Therapy

## 2019-10-13 DIAGNOSIS — M25652 Stiffness of left hip, not elsewhere classified: Secondary | ICD-10-CM

## 2019-10-13 DIAGNOSIS — M6281 Muscle weakness (generalized): Secondary | ICD-10-CM

## 2019-10-13 DIAGNOSIS — M25651 Stiffness of right hip, not elsewhere classified: Secondary | ICD-10-CM

## 2019-10-13 NOTE — Therapy (Signed)
[place], [place], [address] Phone: [phone]   Fax:  [phone]  Physical Therapy Treatment/ Discharge Summary  Patient Details  Name: Erik Odonnell MRN: [id number] Date of Birth: 08-Aug-1946 Referring Provider (PT): Edmonia Lynch MD   Encounter Date: 10/13/2019   PHYSICAL THERAPY DISCHARGE SUMMARY  Visits from Start of Care: 10  Current functional level related to goals / functional outcomes: See below   Remaining deficits: See below   Education / Equipment: See assessment  Plan: Patient agrees to discharge.  Patient goals were met. Patient is being discharged due to meeting the stated rehab goals.  ?????       PT End of Session - 10/13/19 0915    Visit Number  10    Number of Visits  12    Date for PT Re-Evaluation  10/23/19   Progress note completed 12/10   Authorization Type  [employer] Medicare    Authorization Time Period  09/08/19-10/23/19    Authorization - Visit Number  5    Authorization - Number of Visits  10    PT Start Time  0910    PT Stop Time  0950    PT Time Calculation (min)  40 min    Equipment Utilized During Treatment  Gait belt    Activity Tolerance  Patient tolerated treatment well    Behavior During Therapy  [facility] for tasks assessed/performed       Past Medical History:  Diagnosis Date  . Arthritis   . Diverticulosis   . Hemifacial spasm    left side affected takes Artane daily;   . History of bronchitis 11/2013  . Hypertension    takes Ziac daily  . Joint pain     Past Surgical History:  Procedure Laterality Date  . CATARACT EXTRACTION, BILATERAL Bilateral 2015, 2016  . COLONOSCOPY    . COLONOSCOPY N/A 12/18/2017   Procedure: COLONOSCOPY;  Surgeon: Rogene Houston, MD;  [facility];  Service: Endoscopy;  Laterality: N/A;  930  . eye muschle surgeries  as a child   x 5  . HERNIA REPAIR Left    as a child-inguinal;right inguinal in 1969  . TOTAL HIP  ARTHROPLASTY Right 03/24/2014   Procedure: TOTAL HIP ARTHROPLASTY ANTERIOR APPROACH;  Surgeon: Ninetta Lights, MD;  Location: [place];  Service: Orthopedics;  Laterality: Right;  . TOTAL HIP ARTHROPLASTY Left 08/26/2018   Procedure: LEFT TOTAL HIP ARTHROPLASTY ANTERIOR APPROACH;  Surgeon: Renette Butters, MD;  [facility];  Service: Orthopedics;  Laterality: Left;    There were no vitals filed for this visit.  Subjective Assessment - 10/13/19 0914    Subjective  Patient says he was able to go up and down a step ladder yesterday and felt better balanced doing so. Patient reports no pain currently and that he is now able to do things that he was not previously able to do like squat and put his socks on. Patient says he feels he is now "back to normal" and feels prepared for discharge from therapy.    Patient Stated Goals  Improve walk and balance    Currently in Pain?  No/denies         [facility] PT Assessment - 10/13/19 0001      Assessment   Medical Diagnosis  Bilateral hip stiffness    Referring Provider (PT)  Edmonia Lynch MD    Onset Date/Surgical Date  09/05/18  Prior Therapy  Yes       Precautions   Precautions  None      Restrictions   Weight Bearing Restrictions  No      Balance Screen   Has the patient fallen in the past 6 months  No      [place] residence      Prior Function   Level of Independence  Independent      Cognition   Overall Cognitive Status  Within Functional Limits for tasks assessed      Observation/Other Assessments   Focus on Therapeutic Outcomes (FOTO)   1% limited   was 20%     AROM   Right Hip Extension  12    Right Hip Flexion  115    Left Hip Extension  13    Left Hip Flexion  105      Strength   Right Hip Flexion  5/5    Right Hip Extension  5/5   was 4+   Right Hip ABduction  5/5   was 4+   Left Hip Flexion  5/5    Left Hip Extension  5/5    Left Hip ABduction  5/5   was 4+   Right  Knee Flexion  5/5    Right Knee Extension  5/5    Left Knee Flexion  5/5    Left Knee Extension  5/5    Right Ankle Dorsiflexion  5/5    Left Ankle Dorsiflexion  5/5      Static Standing Balance   Static Standing Balance -  Activities   Single Leg Stance - Right Leg;Single Leg Stance - Left Leg    Static Standing - Comment/# of Minutes  12 sec; 5 sec                   [facility] Adult PT Treatment/Exercise - 10/13/19 0001      Knee/Hip Exercises: Stretches   Knee: Self-Stretch to increase Flexion  Both;10 seconds;5 reps    Knee: Self-Stretch Limitations  on second step at stairs     Gastroc Stretch  Both;3 reps;30 seconds    Gastroc Stretch Limitations  at stairs      Knee/Hip Exercises: Aerobic   Recumbent Bike  4 min warm up lv 3      Knee/Hip Exercises: Standing   Functional Squat  2 sets;10 reps    Functional Squat Limitations  front of chair for mechanics    Other Standing Knee Exercises  band sidestep, 2RT, green; monster walks,2RT, green              PT Education - 10/13/19 1050    Education Details  Patient educated on reassessment findings, DC status and HEP    Person(s) Educated  Patient    Methods  Explanation;Handout    Comprehension  Verbalized understanding       PT Short Term Goals - 10/01/19 0935      PT SHORT TERM GOAL #1   Title  Patient will be independent with initial HEP to improve functional outcomes    Time  3    Period  Weeks    Status  Achieved    Target Date  10/02/19        PT Long Term Goals - 10/13/19 1057      PT LONG TERM GOAL #1   Title  Patient will be able to perform SLS bilaterally on compliant surface to  improve stability funcitonal mobility on uneven surfaces (in yard)    Baseline  Current: 10 sec on RLE; 4-5 sec on LLE with moderate sway    Time  6    Period  Weeks    Status  Partially Met      PT LONG TERM GOAL #2   Title  Patient will have greater then or equal to 4+/5 MMT in BLE to improve funcitonal  mobility and ability to return to gym.    Time  6    Period  Weeks    Status  Achieved      PT LONG TERM GOAL #3   Title  Patient will improve bilateral hip flexion to >115 and hip extension >10 degrees in order to improve stride length and increase gait speed, and potentially return to jogging.    Baseline  extension goals met, within 10 degrees of hip flexion goal on LT    Time  6    Period  Weeks    Status  Partially Met            Plan - 10/13/19 1051    Clinical Impression Statement  Patient has made great progress to LTGs and is being discharged from therapy today. Patient has met all goals, except having slight difficulty with LT SLS, and LT hip flexion ROM, but deficits are minimal, and significantly improved since starting therapy. From a functional standpoint, patient has returned to prior level of function with activity, recreation, and ADLs. Patient educated on and issued updated HEP handout. Patient instructed to follow up with physical therapy services with any further questions or concerns.    Personal Factors and Comorbidities  Age    Examination-Activity Limitations  Bathing;Sit;Bed Mobility;Sleep;Bend;Squat;Stairs;Carry;Stand;Transfers;Dressing;Locomotion Level;Lift    Examination-Participation Restrictions  Cleaning;Community Activity;Yard Work    Stability/Clinical Decision Making  Stable/Uncomplicated    Rehab Potential  Good    PT Treatment/Interventions  ADLs/Self Care Home Management;Biofeedback;Electrical Stimulation;Cryotherapy;Moist Heat;Balance training;Therapeutic exercise;Manual techniques;Therapeutic activities;Functional mobility training;Orthotic Fit/Training;Stair training;Gait training;DME Instruction;Patient/family education;Neuromuscular re-education;Compression bandaging;Scar mobilization;Passive range of motion;Energy conservation;Splinting;Taping;Vasopneumatic Device;Joint Manipulations    PT Next Visit Plan  DC    PT Home Exercise Plan  09/08/19:  mod thomas stretch, bridge; 11/19: sidelying abduction and SLS    Consulted and Agree with Plan of Care  Patient       Patient will benefit from skilled therapeutic intervention in order to improve the following deficits and impairments:  Abnormal gait, Hypomobility, Decreased strength, Decreased balance, Decreased mobility, Difficulty walking, Decreased range of motion, Impaired flexibility, Improper body mechanics  Visit Diagnosis: Stiffness of right hip, not elsewhere classified  Muscle weakness (generalized)  Stiffness of left hip, not elsewhere classified     Problem List Patient Active Problem List   Diagnosis Date Noted  . Primary osteoarthritis of hip 08/26/2018  . Primary osteoarthritis of left hip 08/04/2018  . Special screening for malignant neoplasms, colon 09/11/2017  . Proximal leg weakness 04/07/2014  . Difficulty in walking(719.7) 04/07/2014  . Impairment of balance 04/07/2014  . Primary localized osteoarthrosis, pelvic region and thigh 03/24/2014    11:11 AM, 10/13/19 Josue Hector PT DPT  Physical Therapist with [facility]  [phone] [place] [address] [place], [place], [address] Phone: [phone]   Fax:  [phone]  Name: Erik Odonnell MRN: [id number] Date of Birth: 03-04-1946

## 2019-10-26 ENCOUNTER — Encounter (HOSPITAL_COMMUNITY): Payer: Medicare Other | Admitting: Physical Therapy

## 2019-10-29 ENCOUNTER — Encounter (HOSPITAL_COMMUNITY): Payer: Medicare Other | Admitting: Physical Therapy

## 2019-11-02 ENCOUNTER — Encounter (HOSPITAL_COMMUNITY): Payer: Medicare Other | Admitting: Physical Therapy

## 2019-11-04 ENCOUNTER — Ambulatory Visit: Payer: Medicare Other | Attending: Internal Medicine

## 2019-11-04 DIAGNOSIS — Z23 Encounter for immunization: Secondary | ICD-10-CM | POA: Insufficient documentation

## 2019-11-04 NOTE — Progress Notes (Signed)
   Covid-19 Vaccination Clinic  Name:  Erik Odonnell    MRN: [id number] DOB: [birth year]  11/04/2019  [name] was observed post Covid-19 immunization for 15 minutes without incidence. He was provided with Vaccine Information Sheet and instruction to access the V-Safe system.   [name] was instructed to call 911 with any severe reactions post vaccine: Marland Kitchen Difficulty breathing  . Swelling of your face and throat  . A fast heartbeat  . A bad rash all over your body  . Dizziness and weakness    Immunizations Administered    Name Date Dose VIS Date Route   Pfizer COVID-19 Vaccine 11/04/2019 10:22 AM 0.3 mL 10/02/2019 Intramuscular   Manufacturer: [employer], [employer]   Lot: [id number]   [place]: [id number]

## 2019-11-24 ENCOUNTER — Ambulatory Visit: Payer: Medicare PPO | Attending: Internal Medicine

## 2019-11-24 DIAGNOSIS — Z23 Encounter for immunization: Secondary | ICD-10-CM

## 2019-11-24 NOTE — Progress Notes (Signed)
   Covid-19 Vaccination Clinic  Name:  Erik Odonnell    MRN: [id number] DOB: Mar 19, 1946  11/24/2019  [name] was observed post Covid-19 immunization for 15 minutes without incidence. He was provided with Vaccine Information Sheet and instruction to access the V-Safe system.   [name] was instructed to call 911 with any severe reactions post vaccine: Marland Kitchen Difficulty breathing  . Swelling of your face and throat  . A fast heartbeat  . A bad rash all over your body  . Dizziness and weakness    Immunizations Administered    Name Date Dose VIS Date Route   Pfizer COVID-19 Vaccine 11/24/2019  9:40 AM 0.3 mL 10/02/2019 Intramuscular   Manufacturer: [place]   Lot: [id number]   [place]: [id number]

## 2019-12-29 DIAGNOSIS — E039 Hypothyroidism, unspecified: Secondary | ICD-10-CM | POA: Diagnosis not present

## 2019-12-29 DIAGNOSIS — Z1389 Encounter for screening for other disorder: Secondary | ICD-10-CM | POA: Diagnosis not present

## 2019-12-29 DIAGNOSIS — Z0001 Encounter for general adult medical examination with abnormal findings: Secondary | ICD-10-CM | POA: Diagnosis not present

## 2019-12-29 DIAGNOSIS — E7849 Other hyperlipidemia: Secondary | ICD-10-CM | POA: Diagnosis not present

## 2019-12-29 DIAGNOSIS — E782 Mixed hyperlipidemia: Secondary | ICD-10-CM | POA: Diagnosis not present

## 2019-12-29 DIAGNOSIS — Z6833 Body mass index (BMI) 33.0-33.9, adult: Secondary | ICD-10-CM | POA: Diagnosis not present

## 2019-12-29 DIAGNOSIS — E7489 Other specified disorders of carbohydrate metabolism: Secondary | ICD-10-CM | POA: Diagnosis not present

## 2019-12-29 DIAGNOSIS — E6609 Other obesity due to excess calories: Secondary | ICD-10-CM | POA: Diagnosis not present

## 2020-02-01 DIAGNOSIS — G245 Blepharospasm: Secondary | ICD-10-CM | POA: Diagnosis not present

## 2020-02-01 DIAGNOSIS — R251 Tremor, unspecified: Secondary | ICD-10-CM | POA: Diagnosis not present

## 2020-02-01 DIAGNOSIS — G5139 Clonic hemifacial spasm, unspecified: Secondary | ICD-10-CM | POA: Diagnosis not present

## 2020-02-02 DIAGNOSIS — E7489 Other specified disorders of carbohydrate metabolism: Secondary | ICD-10-CM | POA: Diagnosis not present

## 2020-02-02 DIAGNOSIS — D6942 Congenital and hereditary thrombocytopenia purpura: Secondary | ICD-10-CM | POA: Diagnosis not present

## 2020-03-17 DIAGNOSIS — L570 Actinic keratosis: Secondary | ICD-10-CM | POA: Diagnosis not present

## 2020-03-17 DIAGNOSIS — X32XXXD Exposure to sunlight, subsequent encounter: Secondary | ICD-10-CM | POA: Diagnosis not present

## 2020-03-17 DIAGNOSIS — L298 Other pruritus: Secondary | ICD-10-CM | POA: Diagnosis not present

## 2020-03-17 DIAGNOSIS — L821 Other seborrheic keratosis: Secondary | ICD-10-CM | POA: Diagnosis not present

## 2020-03-17 DIAGNOSIS — Z1283 Encounter for screening for malignant neoplasm of skin: Secondary | ICD-10-CM | POA: Diagnosis not present

## 2020-07-19 ENCOUNTER — Encounter (HOSPITAL_COMMUNITY): Payer: Self-pay | Admitting: Physical Therapy

## 2020-09-08 DIAGNOSIS — L570 Actinic keratosis: Secondary | ICD-10-CM | POA: Diagnosis not present

## 2020-09-08 DIAGNOSIS — X32XXXD Exposure to sunlight, subsequent encounter: Secondary | ICD-10-CM | POA: Diagnosis not present

## 2020-10-31 DIAGNOSIS — H52203 Unspecified astigmatism, bilateral: Secondary | ICD-10-CM | POA: Diagnosis not present

## 2020-10-31 DIAGNOSIS — H524 Presbyopia: Secondary | ICD-10-CM | POA: Diagnosis not present

## 2020-10-31 DIAGNOSIS — Z961 Presence of intraocular lens: Secondary | ICD-10-CM | POA: Diagnosis not present

## 2020-10-31 DIAGNOSIS — G245 Blepharospasm: Secondary | ICD-10-CM | POA: Diagnosis not present

## 2021-01-02 DIAGNOSIS — E7849 Other hyperlipidemia: Secondary | ICD-10-CM | POA: Diagnosis not present

## 2021-01-02 DIAGNOSIS — E063 Autoimmune thyroiditis: Secondary | ICD-10-CM | POA: Diagnosis not present

## 2021-01-02 DIAGNOSIS — E1140 Type 2 diabetes mellitus with diabetic neuropathy, unspecified: Secondary | ICD-10-CM | POA: Diagnosis not present

## 2021-01-02 DIAGNOSIS — Z6833 Body mass index (BMI) 33.0-33.9, adult: Secondary | ICD-10-CM | POA: Diagnosis not present

## 2021-01-02 DIAGNOSIS — E039 Hypothyroidism, unspecified: Secondary | ICD-10-CM | POA: Diagnosis not present

## 2021-01-02 DIAGNOSIS — Z0001 Encounter for general adult medical examination with abnormal findings: Secondary | ICD-10-CM | POA: Diagnosis not present

## 2021-01-02 DIAGNOSIS — Z79899 Other long term (current) drug therapy: Secondary | ICD-10-CM | POA: Diagnosis not present

## 2021-01-02 DIAGNOSIS — Z1331 Encounter for screening for depression: Secondary | ICD-10-CM | POA: Diagnosis not present

## 2021-01-02 DIAGNOSIS — I10 Essential (primary) hypertension: Secondary | ICD-10-CM | POA: Diagnosis not present

## 2021-03-02 DIAGNOSIS — Z1283 Encounter for screening for malignant neoplasm of skin: Secondary | ICD-10-CM | POA: Diagnosis not present

## 2021-03-02 DIAGNOSIS — D225 Melanocytic nevi of trunk: Secondary | ICD-10-CM | POA: Diagnosis not present

## 2021-03-02 DIAGNOSIS — L570 Actinic keratosis: Secondary | ICD-10-CM | POA: Diagnosis not present

## 2021-03-02 DIAGNOSIS — X32XXXD Exposure to sunlight, subsequent encounter: Secondary | ICD-10-CM | POA: Diagnosis not present

## 2021-03-13 DIAGNOSIS — Z79899 Other long term (current) drug therapy: Secondary | ICD-10-CM | POA: Diagnosis not present

## 2021-03-13 DIAGNOSIS — I10 Essential (primary) hypertension: Secondary | ICD-10-CM | POA: Diagnosis not present

## 2021-03-13 DIAGNOSIS — E039 Hypothyroidism, unspecified: Secondary | ICD-10-CM | POA: Diagnosis not present

## 2021-03-24 DIAGNOSIS — U071 COVID-19: Secondary | ICD-10-CM | POA: Diagnosis not present

## 2021-06-22 DIAGNOSIS — Z79899 Other long term (current) drug therapy: Secondary | ICD-10-CM | POA: Diagnosis not present

## 2021-06-22 DIAGNOSIS — I10 Essential (primary) hypertension: Secondary | ICD-10-CM | POA: Diagnosis not present

## 2021-06-22 DIAGNOSIS — E063 Autoimmune thyroiditis: Secondary | ICD-10-CM | POA: Diagnosis not present

## 2021-06-22 DIAGNOSIS — R413 Other amnesia: Secondary | ICD-10-CM | POA: Diagnosis not present

## 2021-06-22 DIAGNOSIS — E6609 Other obesity due to excess calories: Secondary | ICD-10-CM | POA: Diagnosis not present

## 2021-06-22 DIAGNOSIS — G459 Transient cerebral ischemic attack, unspecified: Secondary | ICD-10-CM | POA: Diagnosis not present

## 2021-06-22 DIAGNOSIS — R1310 Dysphagia, unspecified: Secondary | ICD-10-CM | POA: Diagnosis not present

## 2021-06-22 DIAGNOSIS — G5132 Clonic hemifacial spasm, left: Secondary | ICD-10-CM | POA: Diagnosis not present

## 2021-06-22 DIAGNOSIS — F801 Expressive language disorder: Secondary | ICD-10-CM | POA: Diagnosis not present

## 2021-06-22 DIAGNOSIS — Z6834 Body mass index (BMI) 34.0-34.9, adult: Secondary | ICD-10-CM | POA: Diagnosis not present

## 2021-06-22 DIAGNOSIS — E039 Hypothyroidism, unspecified: Secondary | ICD-10-CM | POA: Diagnosis not present

## 2021-06-22 DIAGNOSIS — Z8616 Personal history of COVID-19: Secondary | ICD-10-CM | POA: Diagnosis not present

## 2021-06-22 DIAGNOSIS — E669 Obesity, unspecified: Secondary | ICD-10-CM | POA: Diagnosis not present

## 2021-08-08 DIAGNOSIS — R251 Tremor, unspecified: Secondary | ICD-10-CM | POA: Diagnosis not present

## 2021-08-08 DIAGNOSIS — G5139 Clonic hemifacial spasm, unspecified: Secondary | ICD-10-CM | POA: Diagnosis not present

## 2021-08-08 DIAGNOSIS — G245 Blepharospasm: Secondary | ICD-10-CM | POA: Diagnosis not present

## 2021-08-08 DIAGNOSIS — G3184 Mild cognitive impairment, so stated: Secondary | ICD-10-CM | POA: Diagnosis not present

## 2021-08-08 DIAGNOSIS — U099 Post covid-19 condition, unspecified: Secondary | ICD-10-CM | POA: Diagnosis not present

## 2021-08-28 DIAGNOSIS — I10 Essential (primary) hypertension: Secondary | ICD-10-CM | POA: Diagnosis not present

## 2021-08-28 DIAGNOSIS — J111 Influenza due to unidentified influenza virus with other respiratory manifestations: Secondary | ICD-10-CM | POA: Diagnosis not present

## 2021-08-28 DIAGNOSIS — E6609 Other obesity due to excess calories: Secondary | ICD-10-CM | POA: Diagnosis not present

## 2021-08-28 DIAGNOSIS — Z6832 Body mass index (BMI) 32.0-32.9, adult: Secondary | ICD-10-CM | POA: Diagnosis not present

## 2021-08-28 DIAGNOSIS — E063 Autoimmune thyroiditis: Secondary | ICD-10-CM | POA: Diagnosis not present

## 2021-08-28 DIAGNOSIS — E1140 Type 2 diabetes mellitus with diabetic neuropathy, unspecified: Secondary | ICD-10-CM | POA: Diagnosis not present

## 2021-08-31 DIAGNOSIS — L570 Actinic keratosis: Secondary | ICD-10-CM | POA: Diagnosis not present

## 2021-08-31 DIAGNOSIS — L821 Other seborrheic keratosis: Secondary | ICD-10-CM | POA: Diagnosis not present

## 2021-08-31 DIAGNOSIS — L308 Other specified dermatitis: Secondary | ICD-10-CM | POA: Diagnosis not present

## 2021-08-31 DIAGNOSIS — X32XXXD Exposure to sunlight, subsequent encounter: Secondary | ICD-10-CM | POA: Diagnosis not present

## 2021-09-18 ENCOUNTER — Other Ambulatory Visit: Payer: Self-pay

## 2021-09-18 ENCOUNTER — Ambulatory Visit (HOSPITAL_COMMUNITY)
Admission: RE | Admit: 2021-09-18 | Discharge: 2021-09-18 | Disposition: A | Discharge status: Home / Self Care | Payer: Medicare PPO | Source: Ambulatory Visit | Attending: Internal Medicine | Admitting: Internal Medicine

## 2021-09-18 ENCOUNTER — Other Ambulatory Visit (HOSPITAL_COMMUNITY): Payer: Self-pay | Admitting: Internal Medicine

## 2021-09-18 DIAGNOSIS — M150 Primary generalized (osteo)arthritis: Secondary | ICD-10-CM | POA: Diagnosis not present

## 2021-09-18 DIAGNOSIS — E6609 Other obesity due to excess calories: Secondary | ICD-10-CM | POA: Diagnosis not present

## 2021-09-18 DIAGNOSIS — R059 Cough, unspecified: Secondary | ICD-10-CM | POA: Diagnosis not present

## 2021-09-18 DIAGNOSIS — E063 Autoimmune thyroiditis: Secondary | ICD-10-CM | POA: Diagnosis not present

## 2021-09-18 DIAGNOSIS — E1140 Type 2 diabetes mellitus with diabetic neuropathy, unspecified: Secondary | ICD-10-CM | POA: Diagnosis not present

## 2021-09-18 DIAGNOSIS — Z6832 Body mass index (BMI) 32.0-32.9, adult: Secondary | ICD-10-CM | POA: Diagnosis not present

## 2021-09-18 DIAGNOSIS — J111 Influenza due to unidentified influenza virus with other respiratory manifestations: Secondary | ICD-10-CM | POA: Diagnosis not present

## 2021-09-18 DIAGNOSIS — R053 Chronic cough: Secondary | ICD-10-CM | POA: Diagnosis not present

## 2021-11-02 DIAGNOSIS — H524 Presbyopia: Secondary | ICD-10-CM | POA: Diagnosis not present

## 2021-11-02 DIAGNOSIS — G245 Blepharospasm: Secondary | ICD-10-CM | POA: Diagnosis not present

## 2021-11-02 DIAGNOSIS — H52203 Unspecified astigmatism, bilateral: Secondary | ICD-10-CM | POA: Diagnosis not present

## 2021-11-02 DIAGNOSIS — Z961 Presence of intraocular lens: Secondary | ICD-10-CM | POA: Diagnosis not present

## 2021-11-09 DIAGNOSIS — G5139 Clonic hemifacial spasm, unspecified: Secondary | ICD-10-CM | POA: Diagnosis not present

## 2021-11-09 DIAGNOSIS — U099 Post covid-19 condition, unspecified: Secondary | ICD-10-CM | POA: Diagnosis not present

## 2021-11-09 DIAGNOSIS — R251 Tremor, unspecified: Secondary | ICD-10-CM | POA: Diagnosis not present

## 2021-11-09 DIAGNOSIS — G245 Blepharospasm: Secondary | ICD-10-CM | POA: Diagnosis not present

## 2021-11-10 DIAGNOSIS — E7489 Other specified disorders of carbohydrate metabolism: Secondary | ICD-10-CM | POA: Diagnosis not present

## 2021-11-10 DIAGNOSIS — E119 Type 2 diabetes mellitus without complications: Secondary | ICD-10-CM | POA: Diagnosis not present

## 2021-11-10 DIAGNOSIS — G459 Transient cerebral ischemic attack, unspecified: Secondary | ICD-10-CM | POA: Diagnosis not present

## 2021-11-10 DIAGNOSIS — Z79899 Other long term (current) drug therapy: Secondary | ICD-10-CM | POA: Diagnosis not present

## 2021-11-10 DIAGNOSIS — R1310 Dysphagia, unspecified: Secondary | ICD-10-CM | POA: Diagnosis not present

## 2021-11-10 DIAGNOSIS — R5383 Other fatigue: Secondary | ICD-10-CM | POA: Diagnosis not present

## 2021-11-10 DIAGNOSIS — E039 Hypothyroidism, unspecified: Secondary | ICD-10-CM | POA: Diagnosis not present

## 2021-11-23 DIAGNOSIS — G4730 Sleep apnea, unspecified: Secondary | ICD-10-CM | POA: Diagnosis not present

## 2021-12-11 DIAGNOSIS — E039 Hypothyroidism, unspecified: Secondary | ICD-10-CM | POA: Diagnosis not present

## 2021-12-11 DIAGNOSIS — Z79899 Other long term (current) drug therapy: Secondary | ICD-10-CM | POA: Diagnosis not present

## 2021-12-11 DIAGNOSIS — Z125 Encounter for screening for malignant neoplasm of prostate: Secondary | ICD-10-CM | POA: Diagnosis not present

## 2022-01-16 DIAGNOSIS — E039 Hypothyroidism, unspecified: Secondary | ICD-10-CM | POA: Diagnosis not present

## 2022-01-16 DIAGNOSIS — E6609 Other obesity due to excess calories: Secondary | ICD-10-CM | POA: Diagnosis not present

## 2022-01-16 DIAGNOSIS — Z125 Encounter for screening for malignant neoplasm of prostate: Secondary | ICD-10-CM | POA: Diagnosis not present

## 2022-01-16 DIAGNOSIS — M150 Primary generalized (osteo)arthritis: Secondary | ICD-10-CM | POA: Diagnosis not present

## 2022-01-16 DIAGNOSIS — I10 Essential (primary) hypertension: Secondary | ICD-10-CM | POA: Diagnosis not present

## 2022-01-16 DIAGNOSIS — E1140 Type 2 diabetes mellitus with diabetic neuropathy, unspecified: Secondary | ICD-10-CM | POA: Diagnosis not present

## 2022-01-16 DIAGNOSIS — Z1331 Encounter for screening for depression: Secondary | ICD-10-CM | POA: Diagnosis not present

## 2022-01-16 DIAGNOSIS — Z6833 Body mass index (BMI) 33.0-33.9, adult: Secondary | ICD-10-CM | POA: Diagnosis not present

## 2022-01-16 DIAGNOSIS — E063 Autoimmune thyroiditis: Secondary | ICD-10-CM | POA: Diagnosis not present

## 2022-01-16 DIAGNOSIS — Z0001 Encounter for general adult medical examination with abnormal findings: Secondary | ICD-10-CM | POA: Diagnosis not present

## 2022-03-01 DIAGNOSIS — L570 Actinic keratosis: Secondary | ICD-10-CM | POA: Diagnosis not present

## 2022-03-01 DIAGNOSIS — X32XXXD Exposure to sunlight, subsequent encounter: Secondary | ICD-10-CM | POA: Diagnosis not present

## 2022-03-01 DIAGNOSIS — L821 Other seborrheic keratosis: Secondary | ICD-10-CM | POA: Diagnosis not present

## 2022-03-12 ENCOUNTER — Ambulatory Visit: Payer: Medicare PPO | Admitting: Neurology

## 2022-03-12 ENCOUNTER — Encounter: Payer: Self-pay | Admitting: Neurology

## 2022-03-12 VITALS — BP 160/90 | HR 71 | Ht 68.0 in | Wt 213.0 lb

## 2022-03-12 DIAGNOSIS — G245 Blepharospasm: Secondary | ICD-10-CM

## 2022-03-12 DIAGNOSIS — G5132 Clonic hemifacial spasm, left: Secondary | ICD-10-CM | POA: Diagnosis not present

## 2022-03-12 NOTE — Progress Notes (Signed)
[facility]  PATIENT: Erik Odonnell DOB: 01-01-1946  REQUESTING CLINICIAN: Redmond School, MD HISTORY FROM: Patient and spouse  REASON FOR VISIT: Blepharospasm    HISTORICAL  CHIEF COMPLAINT:  Chief Complaint  Patient presents with   New Patient (Initial Visit)    Room 15, with wife  NP/paper/Lawrence Gerarda Fraction MD [facility] [phone]/Blepharospasm, ptosis    HISTORY OF PRESENT ILLNESS:  This is a 76 year old [place] vet who is presenting with complaint of blepharospasm.  Patient reports a long history of eye disease.  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  He also report bilateral cataract surgery. Starting in 1990 he was diagnosed with left blepharospasm.  He was treated with Botox by Dr. Merlene Laughter.  Patient reported last Botox treatment was 4 years ago.  Since then he has been having increased blepharospasm, increase left hemifacial spasm sometimes involving the cheek and the left upper lip.  This affects his day-to-day activity and mostly when driving.  He is interested in receiving Botox treatment again.   OTHER MEDICAL CONDITIONS: Hypothyroidism, hypotension, gout   REVIEW OF SYSTEMS: Full 14 system review of systems performed and negative with exception of:  as noted in the HPI   ALLERGIES: No Known Allergies  HOME MEDICATIONS: Outpatient Medications Prior to Visit  Medication Sig Dispense Refill   allopurinol (ZYLOPRIM) 300 MG tablet Take 300 mg by mouth daily.     ALPRAZolam (XANAX) 0.5 MG tablet Take 0.25-0.5 mg by mouth at bedtime as needed for anxiety.      aspirin EC 81 MG tablet Take 1 tablet (81 mg total) by mouth 2 (two) times daily. For DVT prophylaxis for 30 days after surgery. 60 tablet 0   bisoprolol-hydrochlorothiazide (ZIAC) 2.5-6.25 MG per tablet Take 1 tablet by mouth daily.     hydrocortisone (ANUSOL-HC) 2.5 % rectal cream Place 1 application  rectally daily as needed for hemorrhoids.      ketoconazole (NIZORAL) 2 % cream Apply 1 application topically daily as needed (foot rash).     levothyroxine (SYNTHROID, LEVOTHROID) 25 MCG tablet Take 25 mcg by mouth daily before breakfast.  10   docusate sodium (COLACE) 100 MG capsule Take 1 capsule (100 mg total) by mouth 2 (two) times daily. To prevent constipation while taking pain medication. 60 capsule 0   gabapentin (NEURONTIN) 300 MG capsule Take 1 capsule (300 mg total) by mouth 2 (two) times daily for 14 days. For 2 weeks post op for pain. 28 capsule 0   HYDROcodone-acetaminophen (NORCO) 5-325 MG tablet Take 1-2 tablets by mouth every 6 (six) hours as needed for moderate pain. 40 tablet 0   methocarbamol (ROBAXIN) 500 MG tablet Take 1 tablet (500 mg total) by mouth every 8 (eight) hours as needed for muscle spasms. 40 tablet 0   ondansetron (ZOFRAN) 4 MG tablet Take 1 tablet (4 mg total) by mouth every 8 (eight) hours as needed for nausea or vomiting. 20 tablet 0   No facility-administered medications prior to visit.    PAST MEDICAL HISTORY: Past Medical History:  Diagnosis Date   Arthritis    Diverticulosis    Hemifacial spasm    left side affected takes Artane daily;    History of bronchitis 11/2013   Hypertension    takes Ziac daily   Joint pain     PAST SURGICAL HISTORY: Past Surgical History:  Procedure Laterality Date   CATARACT  EXTRACTION, BILATERAL Bilateral 2015, 2016   COLONOSCOPY     COLONOSCOPY N/A 12/18/2017   Procedure: COLONOSCOPY;  Surgeon: Rogene Houston, MD;  [facility];  Service: Endoscopy;  Laterality: N/A;  930   eye muschle surgeries  as a child   x 5   HERNIA REPAIR Left    as a child-inguinal;right inguinal in [place] Right 03/24/2014   Procedure: TOTAL HIP ARTHROPLASTY ANTERIOR APPROACH;  Surgeon: Ninetta Lights, MD;  Location: [place];  Service: Orthopedics;  Laterality: Right;   TOTAL HIP ARTHROPLASTY Left  08/26/2018   Procedure: LEFT TOTAL HIP ARTHROPLASTY ANTERIOR APPROACH;  Surgeon: Renette Butters, MD;  [facility];  Service: Orthopedics;  Laterality: Left;    FAMILY HISTORY: Family History  Problem Relation Age of Onset   Colon cancer Neg Hx     SOCIAL HISTORY: Social History   Socioeconomic History   Marital status: Married    Spouse name: Not on file   Number of children: Not on file   Years of education: Not on file   Highest education level: Not on file  Occupational History   Not on file  Tobacco Use   Smoking status: Never   Smokeless tobacco: Never  Vaping Use   Vaping Use: Never used  Substance and Sexual Activity   Alcohol use: Yes    Comment: couple of drinks at supper   Drug use: No   Sexual activity: Yes  Other Topics Concern   Not on file  Social History Narrative   Not on file   Social Determinants of Health   Financial Resource Strain: Not on file  Food Insecurity: Not on file  Transportation Needs: Not on file  Physical Activity: Not on file  Stress: Not on file  Social Connections: Not on file  Intimate Partner Violence: Not on file    PHYSICAL EXAM  GENERAL EXAM/CONSTITUTIONAL: Vitals:  Vitals:   03/12/22 1515  BP: (!) 160/90  Pulse: 71  Weight: 213 lb (96.6 kg)  Height: '5\' 8"'$  (1.727 m)   Body mass index is 32.39 kg/m. Wt Readings from Last 3 Encounters:  03/12/22 213 lb (96.6 kg)  08/18/18 216 lb (98 kg)  12/18/17 195 lb (88.5 kg)   Patient is in no distress; well developed, nourished and groomed; neck is supple  EYES: Pupils round and reactive to light, Visual fields full to confrontation, Extraocular movements intacts,   MUSCULOSKELETAL: Gait, strength, tone, movements noted in Neurologic exam below  NEUROLOGIC: MENTAL STATUS:      View : No data to display.         awake, alert, oriented to person, place and time recent and remote memory intact normal attention and concentration language fluent,  comprehension intact, naming intact fund of knowledge appropriate  CRANIAL NERVE:  2nd, 3rd, 4th, 6th - pupils equal and reactive to light, visual fields full to confrontation, extraocular muscles intact, no nystagmus 5th - facial sensation symmetric 7th - left hemifacial spasms noted  8th - hearing intact 9th - palate elevates symmetrically, uvula midline 11th - shoulder shrug symmetric 12th - tongue protrusion midline  MOTOR:  normal bulk and tone, full strength in the BUE, BLE  SENSORY:  normal and symmetric to light touch, pinprick, temperature, vibration  COORDINATION:  finger-nose-finger, fine finger movements normal  REFLEXES:  deep tendon reflexes present and symmetric  GAIT/STATION:  normal     DIAGNOSTIC DATA (LABS, IMAGING, TESTING) - I reviewed  patient records, labs, notes, testing and imaging myself where available.  Lab Results  Component Value Date   WBC 6.9 08/18/2018   HGB 16.3 08/18/2018   HCT 48.8 08/18/2018   MCV 99.0 08/18/2018   PLT 161 08/18/2018      Component Value Date/Time   NA 135 08/18/2018 1148   K 4.2 08/18/2018 1148   CL 100 08/18/2018 1148   CO2 26 08/18/2018 1148   GLUCOSE 107 (H) 08/18/2018 1148   BUN 29 (H) 08/18/2018 1148   CREATININE 1.23 08/18/2018 1148   CALCIUM 9.1 08/18/2018 1148   PROT 7.5 03/17/2014 1223   ALBUMIN 4.1 03/17/2014 1223   AST 24 03/17/2014 1223   ALT 25 03/17/2014 1223   ALKPHOS 64 03/17/2014 1223   BILITOT 0.5 03/17/2014 1223   GFRNONAA 56 (L) 08/18/2018 1148   GFRAA >60 08/18/2018 1148   No results found for: CHOL, HDL, LDLCALC, LDLDIRECT, TRIG, CHOLHDL No results found for: HGBA1C No results found for: VITAMINB12 No results found for: TSH    ASSESSMENT AND PLAN  76 y.o. year old male with history of gout, hypothyroidism, who is presenting with left hemifacial spasm, blepharospasm, wanting to get Botox treatment.  Patient was previously treated with Botox with good result.  His last  treatment was 4 years ago.  On exam he does have evidence of left hemifacial spasm, which is intermittent and mild.  He reported today is a good day, his symptoms are less severe.  I will set him up for abortive treatment. Wife also had some concern regarding his memory.  They will will follow-up in 6 weeks for a formal memory evaluation.   1. Blepharospasm   2. Hemifacial spasm of left side of face      Patient Instructions  Set up for Botox treatment  Follow up in 6 weeks to discuss memory issues   No orders of the defined types were placed in this encounter.   No orders of the defined types were placed in this encounter.   Return in about 7 weeks (around 04/30/2022).    Alric Ran, MD 03/12/2022, 5:47 PM  [facility] [address] [place], [place] [address] [phone]

## 2022-03-12 NOTE — Patient Instructions (Signed)
Set up for Botox treatment  Follow up in 6 weeks to discuss memory issues

## 2022-04-19 ENCOUNTER — Telehealth: Payer: Self-pay | Admitting: Neurology

## 2022-04-19 NOTE — Telephone Encounter (Signed)
Completed [employer] Botox PA form, placed in Nurse Pod for MD signature.

## 2022-04-25 NOTE — Telephone Encounter (Signed)
Faxed signed PA form with OV notes to [facility].

## 2022-04-25 NOTE — Telephone Encounter (Signed)
Received approval from [place], [place] # [id number] (04/25/2022 - 10/21/2022).

## 2022-05-07 ENCOUNTER — Encounter: Payer: Self-pay | Admitting: Neurology

## 2022-05-07 ENCOUNTER — Ambulatory Visit: Payer: Medicare PPO | Admitting: Neurology

## 2022-05-07 VITALS — BP 134/73 | HR 56 | Ht 68.0 in | Wt 213.0 lb

## 2022-05-07 DIAGNOSIS — R4789 Other speech disturbances: Secondary | ICD-10-CM | POA: Diagnosis not present

## 2022-05-07 DIAGNOSIS — G3184 Mild cognitive impairment, so stated: Secondary | ICD-10-CM | POA: Diagnosis not present

## 2022-05-07 NOTE — Patient Instructions (Addendum)
MRI Brain without contrast, if normal consider speech therapy  Would like to defer Aricept at the moment  Continue currents medications  Follow up in a year    There are well-accepted and sensible ways to reduce risk for Alzheimers disease and other degenerative brain disorders .  Exercise Daily Walk A daily 20 minute walk should be part of your routine. Disease related apathy can be a significant roadblock to exercise and the only way to overcome this is to make it a daily routine and perhaps have a reward at the end (something your loved one loves to eat or drink perhaps) or a personal trainer coming to the home can also be very useful. Most importantly, the patient is much more likely to exercise if the caregiver / spouse does it with him/her. In general a structured, repetitive schedule is best.  General Health: Any diseases which effect your body will effect your brain such as a pneumonia, urinary infection, blood clot, heart attack or stroke. Keep contact with your primary care doctor for regular follow ups.  Sleep. A good nights sleep is healthy for the brain. Seven hours is recommended. If you have insomnia or poor sleep habits we can give you some instructions. If you have sleep apnea wear your mask.  Diet: Eating a heart healthy diet is also a good idea; fish and poultry instead of red meat, nuts (mostly non-peanuts), vegetables, fruits, olive oil or canola oil (instead of butter), minimal salt (use other spices to flavor foods), whole grain rice, bread, cereal and pasta and wine in moderation.Research is now showing that the MIND diet, which is a combination of The Mediterranean diet and the DASH diet, is beneficial for cognitive processing and longevity. Information about this diet can be found in The MIND Diet, a book by Doyne Keel, MS, RDN, and online at NotebookDistributors.si  Finances, Power of Attorney and Advance Directives: You should consider putting legal  safeguards in place with regard to financial and medical decision making. While the spouse always has power of attorney for medical and financial issues in the absence of any form, you should consider what you want in case the spouse / caregiver is no longer around or capable of making decisions.

## 2022-05-07 NOTE — Progress Notes (Signed)
[facility]  PATIENT: Erik Odonnell DOB: 03-Jul-1946  REQUESTING CLINICIAN: Redmond School, MD HISTORY FROM: Patient and spouse  REASON FOR VISIT: Blepharospasm    HISTORICAL  CHIEF COMPLAINT:  Chief Complaint  Patient presents with   Follow-up    Room 13, with wife    INTERVAL HISTORY 05/07/22: Patient presents today for follow-up, at last visit in May, plan was to start Botox injection for his left hemifacial spasm.  He is scheduled for the first buttock injection on July 26.  Today she is presenting with wife due to memory problem.   Patient reports that his memory problems and confusion started while being diagnosed with COVID in 2021.  Since then he has problems remembering, but his main problem is word finding difficulty.  Wife reported he is repetitive, she has to remind him.  And mainly he has problem getting the words out.  Patient also report episode of confusion.  He states yesterday he changed the car motor oil but he was not sure if he did it right so he had to go back and look over his steps over again twice.  He reports changing car motor oils for more than 20 years without any difficulty.  He has trouble falling asleep, feels sleepy during the daytime and currently, he is being evaluated for sleep apnea.    HISTORY OF PRESENT ILLNESS:  This is a 76 year old [place] vet who is presenting with complaint of blepharospasm.  Patient reports a long history of eye disease.  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  He also report bilateral cataract surgery. Starting in 1990 he was diagnosed with left blepharospasm.  He was treated with Botox by Dr. Merlene Laughter.  Patient reported last Botox treatment was 4 years ago.  Since then he has been having increased blepharospasm, increase left hemifacial spasm sometimes involving the cheek and the left upper lip.  This affects his day-to-day activity  and mostly when driving.  He is interested in receiving Botox treatment again.   OTHER MEDICAL CONDITIONS: Hypothyroidism, hypotension, gout   REVIEW OF SYSTEMS: Full 14 system review of systems performed and negative with exception of:  as noted in the HPI   ALLERGIES: No Known Allergies  HOME MEDICATIONS: Outpatient Medications Prior to Visit  Medication Sig Dispense Refill   allopurinol (ZYLOPRIM) 300 MG tablet Take 300 mg by mouth daily.     ALPRAZolam (XANAX) 0.5 MG tablet Take 0.25-0.5 mg by mouth at bedtime as needed for anxiety.      aspirin EC 81 MG tablet Take 1 tablet (81 mg total) by mouth 2 (two) times daily. For DVT prophylaxis for 30 days after surgery. 60 tablet 0   bisoprolol-hydrochlorothiazide (ZIAC) 2.5-6.25 MG per tablet Take 1 tablet by mouth daily.     hydrocortisone (ANUSOL-HC) 2.5 % rectal cream Place 1 application rectally daily as needed for hemorrhoids.      ketoconazole (NIZORAL) 2 % cream Apply 1 application topically daily as needed (foot rash).     levothyroxine (SYNTHROID) 50 MCG tablet Take 50 mcg by mouth daily before breakfast.  10   No facility-administered medications prior to visit.    PAST MEDICAL HISTORY: Past Medical History:  Diagnosis Date   Arthritis    Diverticulosis    Hemifacial spasm    left side affected takes Artane daily;    History of bronchitis 11/2013   Hypertension  takes Ziac daily   Joint pain     PAST SURGICAL HISTORY: Past Surgical History:  Procedure Laterality Date   CATARACT EXTRACTION, BILATERAL Bilateral 2015, 2016   COLONOSCOPY     COLONOSCOPY N/A 12/18/2017   Procedure: COLONOSCOPY;  Surgeon: Rogene Houston, MD;  [facility];  Service: Endoscopy;  Laterality: N/A;  930   eye muschle surgeries  as a child   x 5   HERNIA REPAIR Left    as a child-inguinal;right inguinal in [place] Right 03/24/2014   Procedure: TOTAL HIP ARTHROPLASTY ANTERIOR APPROACH;  Surgeon: Ninetta Lights, MD;  Location: [place];  Service: Orthopedics;  Laterality: Right;   TOTAL HIP ARTHROPLASTY Left 08/26/2018   Procedure: LEFT TOTAL HIP ARTHROPLASTY ANTERIOR APPROACH;  Surgeon: Renette Butters, MD;  [facility];  Service: Orthopedics;  Laterality: Left;    FAMILY HISTORY: Family History  Problem Relation Age of Onset   Colon cancer Neg Hx     SOCIAL HISTORY: Social History   Socioeconomic History   Marital status: Married    Spouse name: Not on file   Number of children: Not on file   Years of education: Not on file   Highest education level: Not on file  Occupational History   Not on file  Tobacco Use   Smoking status: Never   Smokeless tobacco: Never  Vaping Use   Vaping Use: Never used  Substance and Sexual Activity   Alcohol use: Yes    Comment: couple of drinks at supper   Drug use: No   Sexual activity: Yes  Other Topics Concern   Not on file  Social History Narrative   Not on file   Social Determinants of Health   Financial Resource Strain: Not on file  Food Insecurity: Not on file  Transportation Needs: Not on file  Physical Activity: Not on file  Stress: Not on file  Social Connections: Not on file  Intimate Partner Violence: Not on file    PHYSICAL EXAM  GENERAL EXAM/CONSTITUTIONAL: Vitals:  Vitals:   05/07/22 1435  BP: 134/73  Pulse: (!) 56  Weight: 213 lb (96.6 kg)  Height: '5\' 8"'$  (1.727 m)   Body mass index is 32.39 kg/m. Wt Readings from Last 3 Encounters:  05/07/22 213 lb (96.6 kg)  03/12/22 213 lb (96.6 kg)  08/18/18 216 lb (98 kg)   Patient is in no distress; well developed, nourished and groomed; neck is supple  EYES: Pupils round and reactive to light, Visual fields full to confrontation, Extraocular movements intacts,   MUSCULOSKELETAL: Gait, strength, tone, movements noted in Neurologic exam below  NEUROLOGIC: MENTAL STATUS:      No data to display              05/07/2022    3:02 PM  [place]  Cognitive Assessment   Visuospatial/ Executive (0/5) 5  Naming (0/3) 3  Attention: Read list of digits (0/2) 0  Attention: Read list of letters (0/1) 1  Attention: Serial 7 subtraction starting at 100 (0/3) 2  Language: Repeat phrase (0/2) 0  Language : Fluency (0/1) 1  Abstraction (0/2) 2  Delayed Recall (0/5) 0  Orientation (0/6) 6  Total 20     CRANIAL NERVE:  2nd, 3rd, 4th, 6th - pupils equal and reactive to light, visual fields full to confrontation, extraocular muscles intact, no nystagmus 5th - facial sensation symmetric 7th - left hemifacial spasms noted, left eye mainly  closed shut during the interview   8th - hearing intact 9th - palate elevates symmetrically, uvula midline 11th - shoulder shrug symmetric 12th - tongue protrusion midline  MOTOR:  normal bulk and tone, full strength in the BUE, BLE  SENSORY:  normal and symmetric to light touch, pinprick, temperature, vibration  COORDINATION:  finger-nose-finger, fine finger movements normal  REFLEXES:  deep tendon reflexes present and symmetric  GAIT/STATION:  normal    DIAGNOSTIC DATA (LABS, IMAGING, TESTING) - I reviewed patient records, labs, notes, testing and imaging myself where available.  Lab Results  Component Value Date   WBC 6.9 08/18/2018   HGB 16.3 08/18/2018   HCT 48.8 08/18/2018   MCV 99.0 08/18/2018   PLT 161 08/18/2018      Component Value Date/Time   NA 135 08/18/2018 1148   K 4.2 08/18/2018 1148   CL 100 08/18/2018 1148   CO2 26 08/18/2018 1148   GLUCOSE 107 (H) 08/18/2018 1148   BUN 29 (H) 08/18/2018 1148   CREATININE 1.23 08/18/2018 1148   CALCIUM 9.1 08/18/2018 1148   PROT 7.5 03/17/2014 1223   ALBUMIN 4.1 03/17/2014 1223   AST 24 03/17/2014 1223   ALT 25 03/17/2014 1223   ALKPHOS 64 03/17/2014 1223   BILITOT 0.5 03/17/2014 1223   GFRNONAA 56 (L) 08/18/2018 1148   GFRAA >60 08/18/2018 1148   No results found for: "CHOL", "HDL", "LDLCALC", "LDLDIRECT", "TRIG",  "CHOLHDL" No results found for: "HGBA1C" No results found for: "VITAMINB12" No results found for: "TSH"   ASSESSMENT AND PLAN  76 y.o. year old male with history of gout, hypothyroidism, left hemifacial spasm who is presenting for memory problems.  In terms of the left hemifacial spasm, patient is scheduled for Botox injection in July 26.  For his complaint of cognitive impairment and memory problem, Today on exam he scored a 20 out of 30 which is consistent with mild to moderate impairment. Patient is still independent, still drives, denies being lost in familiar places still patient manage his own affairs. Based on history and exam patient likely has mild cognitive impairment.  We discussed starting medication, Aricept and he would like to defer at the moment.  He reported his primary care doctor did recent labs which was normal except for the low thyroid function, therefore his Synthroid was increase.  At the moment we will continue to advise patient to continue current medications, advised him to increased exercise and I will see him in 1 year for follow-up or sooner if worse .  He is comfortable with plan.   1. Mild cognitive impairment   2. Word finding difficulty      Patient Instructions  MRI Brain without contrast, if normal consider speech therapy  Would like to defer Aricept at the moment  Continue currents medications  Follow up in a year    There are well-accepted and sensible ways to reduce risk for Alzheimers disease and other degenerative brain disorders .  Exercise Daily Walk A daily 20 minute walk should be part of your routine. Disease related apathy can be a significant roadblock to exercise and the only way to overcome this is to make it a daily routine and perhaps have a reward at the end (something your loved one loves to eat or drink perhaps) or a personal trainer coming to the home can also be very useful. Most importantly, the patient is much more likely to exercise  if the caregiver / spouse does it with him/her. In  general a structured, repetitive schedule is best.  General Health: Any diseases which effect your body will effect your brain such as a pneumonia, urinary infection, blood clot, heart attack or stroke. Keep contact with your primary care doctor for regular follow ups.  Sleep. A good nights sleep is healthy for the brain. Seven hours is recommended. If you have insomnia or poor sleep habits we can give you some instructions. If you have sleep apnea wear your mask.  Diet: Eating a heart healthy diet is also a good idea; fish and poultry instead of red meat, nuts (mostly non-peanuts), vegetables, fruits, olive oil or canola oil (instead of butter), minimal salt (use other spices to flavor foods), whole grain rice, bread, cereal and pasta and wine in moderation.Research is now showing that the MIND diet, which is a combination of The Mediterranean diet and the DASH diet, is beneficial for cognitive processing and longevity. Information about this diet can be found in The MIND Diet, a book by Doyne Keel, MS, RDN, and online at NotebookDistributors.si  Finances, Power of Attorney and Advance Directives: You should consider putting legal safeguards in place with regard to financial and medical decision making. While the spouse always has power of attorney for medical and financial issues in the absence of any form, you should consider what you want in case the spouse / caregiver is no longer around or capable of making decisions.     Orders Placed This Encounter  Procedures   MR BRAIN WO CONTRAST    No orders of the defined types were placed in this encounter.   Return in about 1 year (around 05/08/2023).  I have spent a total of 50 minutes dedicated to this patient today, preparing to see patient, performing a medically appropriate examination and evaluation, ordering tests and/or medications and procedures, and counseling and  educating the patient/family/caregiver; independently interpreting result and communicating results to the family/patient/caregiver; and documenting clinical information in the electronic medical record.   Alric Ran, MD 05/07/2022, 9:56 PM  [facility] [address], [place], [place] [address] [phone]

## 2022-05-08 ENCOUNTER — Telehealth: Payer: Self-pay | Admitting: Neurology

## 2022-05-08 DIAGNOSIS — R4189 Other symptoms and signs involving cognitive functions and awareness: Secondary | ICD-10-CM

## 2022-05-08 HISTORY — DX: Other symptoms and signs involving cognitive functions and awareness: R41.89

## 2022-05-08 NOTE — Telephone Encounter (Signed)
[employer] Josem Kaufmann: [id number] exp. 05/08/22-06/07/22 sent to [facility]

## 2022-05-16 ENCOUNTER — Encounter: Payer: Self-pay | Admitting: Neurology

## 2022-05-16 ENCOUNTER — Ambulatory Visit: Payer: Medicare PPO | Admitting: Neurology

## 2022-05-16 VITALS — BP 144/92 | HR 49 | Ht 68.0 in | Wt 213.0 lb

## 2022-05-16 DIAGNOSIS — G5132 Clonic hemifacial spasm, left: Secondary | ICD-10-CM | POA: Diagnosis not present

## 2022-05-16 NOTE — Progress Notes (Signed)
Chief Complaint  Patient presents with   Procedure    Rm 12.      ASSESSMENT AND PLAN  Erik Odonnell is a 76 y.o. male  Left hemifacial spasm,  Had previous botulism toxin injection by Dr. Merlene Laughter before, did report improvement with the injection  Long history of strabismus, multiple surgery in the past, still only use 1 eye as dominant eye, apparent bilateral esophoria, left worse than right,  EMG guided xeomin injection, 100 units of BOTOX was dissolved into 1 cc of normal saline, used 40 units, discard  60 units  Left orbicularis oculi, injection was performed at 2,3,4,5,6,7, 8 o'clock position,(2.5 units x7=17.5 units) Right orbicularis oculi, at 7,8, 9 position (2.5 units x3=7.5 units).  Right corrugate 5 units Left corrugate 5 units Process 5 units   DIAGNOSTIC DATA (LABS, IMAGING, TESTING) - I reviewed patient records, labs, notes, testing and imaging myself where available.   MEDICAL HISTORY:  Erik Odonnell is a 76 year old male, patient of Dr. April Manson, for evaluation EMG guided botulism toxin injection for left hemifacial spasm,  I reviewed and summarized the referring note.  Past medical history Hypertension Hypothyroidism on supplement  He was born " crossed eyed", had multiple eye surgery, still has difficulty fused imaging from 2 eyes, he tends to use 1 eye as dominant eye, either left, or right, around 1990, he noticed gradual onset left eye muscle twitching, was diagnosed with left hemifacial spasm, had a few sessions of botulism toxin injection few years back by a local neurologist Dr.[place], did help his left facial spasm  But he has stopped going to his office, now he has worsening left eye twitching, to the point of covering his left vision majority of the time,   Laboratory evaluations,  PHYSICAL EXAM:   Vitals:   05/16/22 0905  BP: (!) 144/92  Pulse: (!) 49   There is no height or weight on file to calculate BMI.  PHYSICAL  EXAMNIATION:  Gen: NAD, conversant, well nourised, well groomed                     Cardiovascular: Regular rate rhythm, no peripheral edema, warm, nontender. Eyes: Conjunctivae clear without exudates or hemorrhage Neck: Supple, no carotid bruits. Pulmonary: Clear to auscultation bilaterally   NEUROLOGICAL EXAM:  MENTAL STATUS: Speech/cognition: Awake, alert, oriented to history taking and casual conversation CRANIAL NERVES: CN II: Visual fields are full to confrontation. Pupils are round equal and briskly reactive to light. CN III, IV, VI: Constant left orbicularis oculi muscle twitching, to the point of cover left eye, only mild extension to left cheek muscle, left frontalis, apparent left esotropia, lesser degree of right esotropia, CN V: Facial sensation is intact to light touch CN VII: Face is symmetric with normal eye closure  CN VIII: Hearing is normal to causal conversation. CN IX, X: Phonation is normal. CN XI: Head turning and shoulder shrug are intact  MOTOR: There is no pronator drift of out-stretched arms. Muscle bulk and tone are normal. Muscle strength is normal.  REFLEXES: Reflexes are 2+ and symmetric at the biceps, triceps, knees, and ankles. Plantar responses are flexor.  SENSORY: Intact to light touch, pinprick and vibratory sensation are intact in fingers and toes.  COORDINATION: There is no trunk or limb dysmetria noted.  GAIT/STANCE: Posture is normal. Gait is steady  REVIEW OF SYSTEMS:  Full 14 system review of systems performed and notable only for as above All other review of systems  were negative.   ALLERGIES: No Known Allergies  HOME MEDICATIONS: Current Outpatient Medications  Medication Sig Dispense Refill   allopurinol (ZYLOPRIM) 300 MG tablet Take 300 mg by mouth daily.     ALPRAZolam (XANAX) 0.5 MG tablet Take 0.25-0.5 mg by mouth at bedtime as needed for anxiety.      aspirin EC 81 MG tablet Take 1 tablet (81 mg total) by mouth 2  (two) times daily. For DVT prophylaxis for 30 days after surgery. 60 tablet 0   bisoprolol-hydrochlorothiazide (ZIAC) 2.5-6.25 MG per tablet Take 1 tablet by mouth daily.     hydrocortisone (ANUSOL-HC) 2.5 % rectal cream Place 1 application rectally daily as needed for hemorrhoids.      ketoconazole (NIZORAL) 2 % cream Apply 1 application topically daily as needed (foot rash).     levothyroxine (SYNTHROID) 50 MCG tablet Take 50 mcg by mouth daily before breakfast.  10   No current facility-administered medications for this visit.    PAST MEDICAL HISTORY: Past Medical History:  Diagnosis Date   Arthritis    Diverticulosis    Hemifacial spasm    left side affected takes Artane daily;    History of bronchitis 11/2013   Hypertension    takes Ziac daily   Joint pain     PAST SURGICAL HISTORY: Past Surgical History:  Procedure Laterality Date   CATARACT EXTRACTION, BILATERAL Bilateral 2015, 2016   COLONOSCOPY     COLONOSCOPY N/A 12/18/2017   Procedure: COLONOSCOPY;  Surgeon: Rogene Houston, MD;  [facility];  Service: Endoscopy;  Laterality: N/A;  930   eye muschle surgeries  as a child   x 5   HERNIA REPAIR Left    as a child-inguinal;right inguinal in [place] Right 03/24/2014   Procedure: TOTAL HIP ARTHROPLASTY ANTERIOR APPROACH;  Surgeon: Ninetta Lights, MD;  Location: [facility];  Service: Orthopedics;  Laterality: Right;   TOTAL HIP ARTHROPLASTY Left 08/26/2018   Procedure: LEFT TOTAL HIP ARTHROPLASTY ANTERIOR APPROACH;  Surgeon: Renette Butters, MD;  [facility];  Service: Orthopedics;  Laterality: Left;    FAMILY HISTORY: Family History  Problem Relation Age of Onset   Colon cancer Neg Hx     SOCIAL HISTORY: Social History   Socioeconomic History   Marital status: Married    Spouse name: Not on file   Number of children: Not on file   Years of education: Not on file   Highest education level: Not on file  Occupational History    Not on file  Tobacco Use   Smoking status: Never   Smokeless tobacco: Never  Vaping Use   Vaping Use: Never used  Substance and Sexual Activity   Alcohol use: Yes    Comment: couple of drinks at supper   Drug use: No   Sexual activity: Yes  Other Topics Concern   Not on file  Social History Narrative   Not on file   Social Determinants of Health   Financial Resource Strain: Not on file  Food Insecurity: Not on file  Transportation Needs: Not on file  Physical Activity: Not on file  Stress: Not on file  Social Connections: Not on file  Intimate Partner Violence: Not on file      Marcial Pacas, M.D. Ph.D.  [facility] [address] [place], [place] [address] Ph: [phone] Fax: [phone]  CC:  Redmond School, [place],  [place] [address]  Redmond School, MD

## 2022-05-16 NOTE — Progress Notes (Signed)
Botox 100 units x 1 vial LOT #: [id number] EXP: 2025/11 NDC: 0923-3007-62

## 2022-05-17 NOTE — Telephone Encounter (Signed)
Received signed patient consent (given to medical records) for (Xeomin) (Dx: G51.3).

## 2022-05-18 DIAGNOSIS — G5132 Clonic hemifacial spasm, left: Secondary | ICD-10-CM

## 2022-05-18 HISTORY — DX: Clonic hemifacial spasm, left: G51.32

## 2022-05-18 MED ORDER — ONABOTULINUMTOXINA 100 UNITS IJ SOLR
100.0000 [IU] | Freq: Once | INTRAMUSCULAR | Status: AC
  Administered 2022-05-18: 100 [IU] via INTRAMUSCULAR

## 2022-05-24 ENCOUNTER — Ambulatory Visit (HOSPITAL_COMMUNITY)
Admission: RE | Admit: 2022-05-24 | Discharge: 2022-05-24 | Disposition: A | Discharge status: Home / Self Care | Payer: Medicare PPO | Source: Ambulatory Visit | Attending: Neurology | Admitting: Neurology

## 2022-05-24 DIAGNOSIS — G3184 Mild cognitive impairment, so stated: Secondary | ICD-10-CM | POA: Diagnosis not present

## 2022-05-24 DIAGNOSIS — G9389 Other specified disorders of brain: Secondary | ICD-10-CM | POA: Diagnosis not present

## 2022-05-25 NOTE — Addendum Note (Signed)
Addended byAlric Ran on: 05/25/2022 12:06 PM   Modules accepted: Orders

## 2022-05-28 ENCOUNTER — Telehealth: Payer: Self-pay | Admitting: Neurology

## 2022-05-28 DIAGNOSIS — I10 Essential (primary) hypertension: Secondary | ICD-10-CM | POA: Diagnosis not present

## 2022-05-28 DIAGNOSIS — K219 Gastro-esophageal reflux disease without esophagitis: Secondary | ICD-10-CM | POA: Diagnosis not present

## 2022-05-28 DIAGNOSIS — M150 Primary generalized (osteo)arthritis: Secondary | ICD-10-CM | POA: Diagnosis not present

## 2022-05-28 DIAGNOSIS — Z6833 Body mass index (BMI) 33.0-33.9, adult: Secondary | ICD-10-CM | POA: Diagnosis not present

## 2022-05-28 DIAGNOSIS — E1140 Type 2 diabetes mellitus with diabetic neuropathy, unspecified: Secondary | ICD-10-CM | POA: Diagnosis not present

## 2022-05-28 DIAGNOSIS — E6609 Other obesity due to excess calories: Secondary | ICD-10-CM | POA: Diagnosis not present

## 2022-05-28 DIAGNOSIS — J209 Acute bronchitis, unspecified: Secondary | ICD-10-CM | POA: Diagnosis not present

## 2022-05-28 NOTE — Telephone Encounter (Signed)
Referral for Speech Therapy sent to [facility] [phone]

## 2022-05-29 ENCOUNTER — Other Ambulatory Visit (HOSPITAL_COMMUNITY): Payer: Self-pay | Admitting: Internal Medicine

## 2022-05-29 DIAGNOSIS — R059 Cough, unspecified: Secondary | ICD-10-CM

## 2022-06-13 ENCOUNTER — Ambulatory Visit (HOSPITAL_COMMUNITY): Payer: Medicare PPO | Attending: Neurology | Admitting: Speech Pathology

## 2022-06-13 ENCOUNTER — Encounter (HOSPITAL_COMMUNITY): Payer: Self-pay | Admitting: Speech Pathology

## 2022-06-13 DIAGNOSIS — R41841 Cognitive communication deficit: Secondary | ICD-10-CM | POA: Diagnosis not present

## 2022-06-13 NOTE — Therapy (Signed)
OUTPATIENT SPEECH LANGUAGE PATHOLOGY EVALUATION   Patient Name: Erik Odonnell MRN: [id number] DOB:1946-02-18, 76 y.o., male Today's Date: 06/13/2022  PCP: Dr. Redmond School REFERRING PROVIDER: Dr. Alric Ran   End of Session - 06/13/22 1028     Visit Number 1    Number of Visits 5    Date for SLP Re-Evaluation 08/02/22    Authorization Type [employer]   EFF DATE-10/23/19  DED-0  OOP MAX-4000.00  VISIT LIMIT-NO  AUTH-YES  COPAY-$20.00   SLP Start Time 0905    SLP Stop Time  1000    SLP Time Calculation (min) 55 min    Activity Tolerance Patient tolerated treatment well             Past Medical History:  Diagnosis Date   Arthritis    Diverticulosis    Hemifacial spasm    left side affected takes Artane daily;    History of bronchitis 11/2013   Hypertension    takes Ziac daily   Joint pain    Past Surgical History:  Procedure Laterality Date   CATARACT EXTRACTION, BILATERAL Bilateral 2015, 2016   COLONOSCOPY     COLONOSCOPY N/A 12/18/2017   Procedure: COLONOSCOPY;  Surgeon: Rogene Houston, MD;  [facility];  Service: Endoscopy;  Laterality: N/A;  930   eye muschle surgeries  as a child   x 5   HERNIA REPAIR Left    as a child-inguinal;right inguinal in [place] Right 03/24/2014   Procedure: TOTAL HIP ARTHROPLASTY ANTERIOR APPROACH;  Surgeon: Ninetta Lights, MD;  Location: [facility] [place];  Service: Orthopedics;  Laterality: Right;   TOTAL HIP ARTHROPLASTY Left 08/26/2018   Procedure: LEFT TOTAL HIP ARTHROPLASTY ANTERIOR APPROACH;  Surgeon: Renette Butters, MD;  [facility];  Service: Orthopedics;  Laterality: Left;   Patient Active Problem List   Diagnosis Date Noted   Hemifacial spasm of left side of face 05/18/2022   Primary osteoarthritis of hip 08/26/2018   Primary osteoarthritis of left hip 08/04/2018   Special screening for malignant neoplasms, colon 09/11/2017   Proximal leg weakness 04/07/2014   Difficulty in  walking(719.7) 04/07/2014   Impairment of balance 04/07/2014   Primary localized osteoarthrosis, pelvic region and thigh 03/24/2014    ONSET DATE: June 2022   REFERRING DIAG: G31.84 (ICD-10-CM) - Mild cognitive impairment R47.89 (ICD-10-CM) - Word finding difficulty   THERAPY DIAG:  Cognitive communication deficit  Rationale for Evaluation and Treatment Rehabilitation  SUBJECTIVE:   SUBJECTIVE STATEMENT: "I stutter at times and I cannot find my words." Pt accompanied by: self  PERTINENT HISTORY: Mr. Erik Odonnell is a 76 yo male who was referred by Dr. Alric Ran for a cognitive linguistic evaluation and treatment due to Pt reports of difficulty with word finding, mild memory deficits, and occasional verbal dysfluencies. Patient reports that his memory problems and confusion started while being diagnosed with COVID in 2021.  His wife reported he is repetitive and she has to remind him of things. He is a retired high school history Pharmacist, hospital, lives with his wife, and enjoys traveling with family, gardening, watching TV, and staying active. He sees [name] for Botox for his left eye. He indicates that he stuttered when he was in elementary school, but that he had no evidence of this by the time he entered middle/high school. He now finds that he stumbles over his words.   PAIN:  Are you having pain? No   FALLS:  Has patient fallen in last 6 months?  No  LIVING ENVIRONMENT: Lives with: lives with their spouse Lives in: House/apartment  PLOF:  Level of assistance: Independent with ADLs Employment: Retired   PATIENT GOALS Improve speech and word finding  OBJECTIVE:   DIAGNOSTIC FINDINGS: Martin Examination Orientation  3/3  Numeric Problem Solving  3/3  Memory  4/5  Attention 2/2  Thought Organization 3/3  Clock Drawing 4/4  Visuospatial Skills               2/2  Short Story Recall  6/8  Total  27/30     Scoring  High School Education  Less than High School  Education   Normal  27-30 25-30  Mild Neurocognitive Disorder 21-26 20-24  Dementia  1-20 1-19     COGNITION: Overall cognitive status: Impaired Areas of impairment:  Memory: Impaired: Immediate Working Short term Functional deficits: Pt reports that his wife has to remind him frequently at home. Pt recalled 4/5 words in delayed recall task. Pt reports feeling "slow" in his thinking. He occasionally forgets to take his medication at the assigned time (AM).  AUDITORY COMPREHENSION: Overall auditory comprehension: Appears intact YES/NO questions: Appears intact Following directions: Appears intact Conversation: Complex Interfering components: working Contractor: repetition/stressing words  READING COMPREHENSION: Not assessed. Pt reports that he enjoys reading history novels, but finds it more challenging lately (since Covid).  EXPRESSION: verbal  VERBAL EXPRESSION: Level of generative/spontaneous verbalization: conversation Automatic speech: name: intact, social response: intact, and month of year: intact  Repetition: Appears intact Naming: Responsive: 51-75%, Confrontation: 76-100%, and Divergent: 76-100% Pragmatics: Appears intact Comments: [employer] Test short form: 15/15; Pt has difficulty with word finding [employer]") in conversation and reports feeling frustrated about this. Interfering components:  Effective technique: semantic cues and phonemic cues Non-verbal means of communication: N/A  WRITTEN EXPRESSION: Dominant hand: right  Written expression: Not tested  MOTOR SPEECH: Overall motor speech: impaired Respiration:  WNL Phonation: normal Resonance: WFL Articulation: Impaired: sentence Intelligibility: Intelligibility reduced Motor planning: Appears intact Motor speech errors: aware Interfering components:  Pt reports history of "stuttering" when he was in 4th grade, but it completely resolved by the time he entered high school. Pt now  reports "stuttering" at times since he had [place] in June 2022. Speech was deemed fluent throughout today's assessment. Effective technique: slow rate  ORAL MOTOR EXAMINATION Overall status: WNL with the exception of left eye paresis- Pt being treated with Botox by Dr. Bonnita Nasuti ASSESSMENTS: SLUMS: 27/30 and BNT short form: 15/15 ; MOCA from neurology appointment: 20/30 (attention and memory deficits 0/5 delayed recall) in July 2023    PATIENT EDUCATION: Education details: Plan for short term cognitive linguistic therapy to address memory, word finding, and dysfluency Person educated: Patient Education method: Explanation and Handouts Education comprehension: verbalized understanding     GOALS: Goals reviewed with patient? Yes  SHORT TERM GOALS: Target date: 07/25/2022   Pt will implement memory strategies in functional therapy activities with 90% acc with min cues.  Baseline: ~70-85% Goal status: INITIAL  2.  Pt will record 3 or greater weekly appointments, reminders, to-do items in her smart phone to facilitate task completion.  Baseline: Pt uses a calendar at home with assist from spouse Goal status: INITIAL  3.  Pt will implement word-finding strategies with 90% accuracy when unable to verbalize desired word in conversation/functional tasks with min assist. Baseline: uses occasionally and needs education for strategies Goal status:  INITIAL  4.  Pt will complete personally relevant responsive naming tasks with 95% acc with use of written cues as needed. Baseline: ~80-90% Goal status: INITIAL  5.  Pt will implement fluency enhancing and speech intelligibility strategies when generating sentences involving multisyllabic words with 90% acc and min assist. Baseline: introduced today, intermittent dysfluencies noted, rare Goal status: INITIAL   LONG TERM GOALS: Same as short term  ASSESSMENT:  CLINICAL IMPRESSION: Patient is a 76 y.o. male who was seen today  for cognitive linguistic evaluation. Pt scored WNL on the [facility] SLUMS and BNT [facility] Naming Test) short form, however reports difficulty with word finding, memory/recall, and "getting his words out" at times. When asked a series of personally relevant responsive naming questions (Where do you bank, grocery shop, pharmacy, etc), Pt completed with 90% acc, however he indicates that this is something he typically has difficulty with. He uses a calendar on the wall at home and his smart phone to help with organization. He drives and handles his finances independently, but occasionally needs reminders from his wife for medication management. Pt enjoys reading, but indicates that it is more challenging for him now. Pt would like to improve his memory and ability to "get" his words out. Recommend course of short term outpatient SLP therapy to address the named deficits. Pt is in agreement with plan of care.   OBJECTIVE IMPAIRMENTS include attention, memory, expressive language, and dysfluency . These impairments are limiting patient from managing medications, managing appointments, and effectively communicating at home and in community. Patient will benefit from skilled SLP services to address above impairments and improve overall function.  REHAB POTENTIAL: Good  PLAN: SLP FREQUENCY: 1x/week  SLP DURATION: 4 weeks  PLANNED INTERVENTIONS: Cueing hierachy, Cognitive reorganization, Internal/external aids, Functional tasks, Multimodal communication approach, SLP instruction and feedback, Compensatory strategies, Patient/family education, and Re-evaluation    Thank you,  Genene Churn, [place]  [place], [place] 06/13/2022, 10:33 AM

## 2022-06-18 ENCOUNTER — Encounter (HOSPITAL_COMMUNITY): Payer: Self-pay | Admitting: Speech Pathology

## 2022-06-18 ENCOUNTER — Ambulatory Visit (HOSPITAL_COMMUNITY): Payer: Medicare PPO | Admitting: Speech Pathology

## 2022-06-18 DIAGNOSIS — R41841 Cognitive communication deficit: Secondary | ICD-10-CM | POA: Diagnosis not present

## 2022-06-18 NOTE — Therapy (Signed)
OUTPATIENT SPEECH LANGUAGE PATHOLOGY TREATMENT NOTE   Patient Name: Erik Odonnell MRN: [id number] DOB:[birth year], 76 y.o., male Today's Date: 06/18/2022  PCP: Dr. Redmond School REFERRING PROVIDER: Dr. Alric Ran   END OF SESSION:   End of Session - 06/18/22 1407     Visit Number 2    Number of Visits 5    Date for SLP Re-Evaluation 08/02/22    Authorization Type [employer]   EFF DATE-10/23/19  DED-0  OOP MAX-4000.00  VISIT LIMIT-NO  AUTH-YES  COPAY-$20.00   SLP Start Time 0954    SLP Stop Time  1039    SLP Time Calculation (min) 45 min    Activity Tolerance Patient tolerated treatment well             Past Medical History:  Diagnosis Date   Arthritis    Diverticulosis    Hemifacial spasm    left side affected takes Artane daily;    History of bronchitis 11/2013   Hypertension    takes Ziac daily   Joint pain    Past Surgical History:  Procedure Laterality Date   CATARACT EXTRACTION, BILATERAL Bilateral 2015, 2016   COLONOSCOPY     COLONOSCOPY N/A 12/18/2017   Procedure: COLONOSCOPY;  Surgeon: Rogene Houston, MD;  [facility];  Service: Endoscopy;  Laterality: N/A;  930   eye muschle surgeries  as a child   x 5   HERNIA REPAIR Left    as a child-inguinal;right inguinal in [place] Right 03/24/2014   Procedure: TOTAL HIP ARTHROPLASTY ANTERIOR APPROACH;  Surgeon: Ninetta Lights, MD;  Location: [facility];  Service: Orthopedics;  Laterality: Right;   TOTAL HIP ARTHROPLASTY Left 08/26/2018   Procedure: LEFT TOTAL HIP ARTHROPLASTY ANTERIOR APPROACH;  Surgeon: Renette Butters, MD;  [facility];  Service: Orthopedics;  Laterality: Left;   Patient Active Problem List   Diagnosis Date Noted   Hemifacial spasm of left side of face 05/18/2022   Primary osteoarthritis of hip 08/26/2018   Primary osteoarthritis of left hip 08/04/2018   Special screening for malignant neoplasms, colon 09/11/2017   Proximal leg weakness  04/07/2014   Difficulty in walking(719.7) 04/07/2014   Impairment of balance 04/07/2014   Primary localized osteoarthrosis, pelvic region and thigh 03/24/2014   ONSET DATE: June 2022    REFERRING DIAG: G31.84 (ICD-10-CM) - Mild cognitive impairment R47.89 (ICD-10-CM) - Word finding difficulty    THERAPY DIAG:  Cognitive communication deficit   Rationale for Evaluation and Treatment Rehabilitation  PERTINENT HISTORY: Mr. Erik Odonnell is a 76 yo male who was referred by Dr. Alric Ran for a cognitive linguistic evaluation and treatment due to Pt reports of difficulty with word finding, mild memory deficits, and occasional verbal dysfluencies. Patient reports that his memory problems and confusion started while being diagnosed with COVID in 2021.  His wife reported he is repetitive and she has to remind him of things. He is a retired high school history Pharmacist, hospital, lives with his wife, and enjoys traveling with family, gardening, watching TV, and staying active. He sees [name] for Botox for his left eye. He indicates that he stuttered when he was in elementary school, but that he had no evidence of this by the time he entered middle/high school. He now finds that he stumbles over his words.    SUBJECTIVE: "I didn't get a chance to get a small notebook this weekend because my grandson was with me."  PAIN:  Are you having pain? No  GOALS: Goals reviewed with patient? Yes   SHORT TERM GOALS: Target date: 07/25/2022   Pt will implement memory strategies in functional therapy activities with 90% acc with min cues.  Baseline: ~70-85% Goal status: ONGOING   2.  Pt will record 3 or greater weekly appointments, reminders, to-do items in her smart phone to facilitate task completion.  Baseline: Pt uses a calendar at home with assist from spouse Goal status: ONGOING   3.  Pt will implement word-finding strategies with 90% accuracy when unable to verbalize desired word in conversation/functional  tasks with min assist. Baseline: uses occasionally and needs education for strategies Goal status: ONGOING   4.  Pt will complete personally relevant responsive naming tasks with 95% acc with use of written cues as needed. Baseline: ~80-90% Goal status: ONGOING   5.  Pt will implement fluency enhancing and speech intelligibility strategies when generating sentences involving multisyllabic words with 90% acc and min assist. Baseline: introduced today, intermittent dysfluencies noted, rare Goal status: ONGOING     LONG TERM GOALS: Same as short term   OBJECTIVE:   TODAY'S TREATMENT: Pt brought his folder given to him last session and reported that he read through the information regarding memory and word finding strategies. SLP provided more in depth education and implementation of strategies this date. Pt not yet able to purchase a notebook, but will before next session. He forgot his cell phone at home, but states that he intends to bring with him when he leaves his house. SLP assisted Pt in creating a short list of items he "needs" to remember to take with him when he leaves home, which include his phone and glasses. Pt exhibited two occurrences of verbal dysfluencies today, one when walking into the treatment room. Once he was seated and relaxed, he was able to verbalize his thoughts fluently. Pt indicates that he is trying to "slow down" when he encounters these situations at home and it seems to help. Pt assigned homework and SLP wrote on a sticky note for him to complete by next session. Continue plan of care and spend more time on word finding next session.  OBJECTIVE IMPAIRMENTS include attention, memory, expressive language, and dysfluency . These impairments are limiting patient from managing medications, managing appointments, and effectively communicating at home and in community. Patient will benefit from skilled SLP services to address above impairments and improve overall function.   PLAN: SLP FREQUENCY: 1x/week   SLP DURATION: 4 weeks   PLANNED INTERVENTIONS: Cueing hierachy, Cognitive reorganization, Internal/external aids, Functional tasks, Multimodal communication approach, SLP instruction and feedback, Compensatory strategies, Patient/family education, and Re-evaluation       Thank you,   Genene Churn, [place]  [place], [place] 06/18/2022, 2:10 PM

## 2022-06-26 DIAGNOSIS — Z6832 Body mass index (BMI) 32.0-32.9, adult: Secondary | ICD-10-CM | POA: Diagnosis not present

## 2022-06-26 DIAGNOSIS — E6609 Other obesity due to excess calories: Secondary | ICD-10-CM | POA: Diagnosis not present

## 2022-06-26 DIAGNOSIS — K219 Gastro-esophageal reflux disease without esophagitis: Secondary | ICD-10-CM | POA: Diagnosis not present

## 2022-06-26 DIAGNOSIS — I10 Essential (primary) hypertension: Secondary | ICD-10-CM | POA: Diagnosis not present

## 2022-06-26 DIAGNOSIS — K625 Hemorrhage of anus and rectum: Secondary | ICD-10-CM | POA: Diagnosis not present

## 2022-06-27 ENCOUNTER — Encounter (INDEPENDENT_AMBULATORY_CARE_PROVIDER_SITE_OTHER): Payer: Self-pay | Admitting: *Deleted

## 2022-06-28 ENCOUNTER — Encounter (HOSPITAL_COMMUNITY): Payer: Self-pay | Admitting: Speech Pathology

## 2022-06-28 ENCOUNTER — Ambulatory Visit (HOSPITAL_COMMUNITY): Payer: Medicare PPO | Attending: Neurology | Admitting: Speech Pathology

## 2022-06-28 DIAGNOSIS — K219 Gastro-esophageal reflux disease without esophagitis: Secondary | ICD-10-CM | POA: Diagnosis not present

## 2022-06-28 DIAGNOSIS — E1140 Type 2 diabetes mellitus with diabetic neuropathy, unspecified: Secondary | ICD-10-CM | POA: Diagnosis not present

## 2022-06-28 DIAGNOSIS — Z6832 Body mass index (BMI) 32.0-32.9, adult: Secondary | ICD-10-CM | POA: Diagnosis not present

## 2022-06-28 DIAGNOSIS — K625 Hemorrhage of anus and rectum: Secondary | ICD-10-CM | POA: Diagnosis not present

## 2022-06-28 DIAGNOSIS — I10 Essential (primary) hypertension: Secondary | ICD-10-CM | POA: Diagnosis not present

## 2022-06-28 DIAGNOSIS — E6609 Other obesity due to excess calories: Secondary | ICD-10-CM | POA: Diagnosis not present

## 2022-06-28 DIAGNOSIS — R41841 Cognitive communication deficit: Secondary | ICD-10-CM | POA: Insufficient documentation

## 2022-06-28 DIAGNOSIS — K648 Other hemorrhoids: Secondary | ICD-10-CM | POA: Diagnosis not present

## 2022-06-28 NOTE — Therapy (Signed)
OUTPATIENT SPEECH LANGUAGE PATHOLOGY TREATMENT NOTE   Patient Name: Erik Odonnell MRN: [id number] DOB:22-Jan-1946, 76 y.o., male Today's Date: 06/28/2022  PCP: Dr. Redmond School REFERRING PROVIDER: Dr. Alric Ran   END OF SESSION:   End of Session - 06/28/22 1101     Visit Number 3    Number of Visits 5    Date for SLP Re-Evaluation 08/02/22    Authorization Type [employer]   EFF DATE-10/23/19  DED-0  OOP MAX-4000.00  VISIT LIMIT-NO  AUTH-YES  COPAY-$[id number]   SLP Start Time 1038    SLP Stop Time  1123    SLP Time Calculation (min) 45 min    Activity Tolerance Patient tolerated treatment well             Past Medical History:  Diagnosis Date   Arthritis    Diverticulosis    Hemifacial spasm    left side affected takes Artane daily;    History of bronchitis 11/2013   Hypertension    takes Ziac daily   Joint pain    Past Surgical History:  Procedure Laterality Date   CATARACT EXTRACTION, BILATERAL Bilateral 2015, 2016   COLONOSCOPY     COLONOSCOPY N/A 12/18/2017   Procedure: COLONOSCOPY;  Surgeon: Rogene Houston, MD;  [facility];  Service: Endoscopy;  Laterality: N/A;  930   eye muschle surgeries  as a child   x 5   HERNIA REPAIR Left    as a child-inguinal;right inguinal in [place] Right 03/24/2014   Procedure: TOTAL HIP ARTHROPLASTY ANTERIOR APPROACH;  Surgeon: Ninetta Lights, MD;  Location: [place];  Service: Orthopedics;  Laterality: Right;   TOTAL HIP ARTHROPLASTY Left 08/26/2018   Procedure: LEFT TOTAL HIP ARTHROPLASTY ANTERIOR APPROACH;  Surgeon: Renette Butters, MD;  [facility]: [facility];  Service: Orthopedics;  Laterality: Left;   Patient Active Problem List   Diagnosis Date Noted   Hemifacial spasm of left side of face 05/18/2022   Primary osteoarthritis of hip 08/26/2018   Primary osteoarthritis of left hip 08/04/2018   Special screening for malignant neoplasms, colon 09/11/2017   Proximal leg weakness 04/07/2014    Difficulty in walking(719.7) 04/07/2014   Impairment of balance 04/07/2014   Primary localized osteoarthrosis, pelvic region and thigh 03/24/2014   ONSET DATE: June 2022    REFERRING DIAG: G31.84 (ICD-10-CM) - Mild cognitive impairment R47.89 (ICD-10-CM) - Word finding difficulty    THERAPY DIAG:  Cognitive communication deficit   Rationale for Evaluation and Treatment Rehabilitation  PERTINENT HISTORY: Erik Odonnell is a 76 yo male who was referred by Dr. Alric Ran for a cognitive linguistic evaluation and treatment due to Pt reports of difficulty with word finding, mild memory deficits, and occasional verbal dysfluencies. Patient reports that his memory problems and confusion started while being diagnosed with COVID in 2021.  His wife reported he is repetitive and she has to remind him of things. He is a retired high school history Pharmacist, hospital, lives with his wife, and enjoys traveling with family, gardening, watching TV, and staying active. He sees [name] for Botox for his left eye. He indicates that he stuttered when he was in elementary school, but that he had no evidence of this by the time he entered middle/high school. He now finds that he stumbles over his words.    SUBJECTIVE: "I have been using my notebook."  PAIN:  Are you having pain? No  GOALS: Goals reviewed with patient?  Yes   SHORT TERM GOALS: Target date: 07/25/2022   Pt will implement memory strategies in functional therapy activities with 90% acc with min cues.  Baseline: ~70-85% Goal status: ONGOING   2.  Pt will record 3 or greater weekly appointments, reminders, to-do items in her smart phone to facilitate task completion.  Baseline: Pt uses a calendar at home with assist from spouse Goal status: ONGOING   3.  Pt will implement word-finding strategies with 90% accuracy when unable to verbalize desired word in conversation/functional tasks with min assist. Baseline: uses occasionally and needs education  for strategies Goal status: ONGOING   4.  Pt will complete personally relevant responsive naming tasks with 95% acc with use of written cues as needed. Baseline: ~80-90% Goal status: ONGOING   5.  Pt will implement fluency enhancing and speech intelligibility strategies when generating sentences involving multisyllabic words with 90% acc and min assist. Baseline: introduced today, intermittent dysfluencies noted, rare Goal status: ONGOING     LONG TERM GOALS: Same as short term   OBJECTIVE:   TODAY'S TREATMENT: Pt slightly dysfluent upon entering treatment room today, which is likely attributed to feeling worried about new rectal bleeding and impending trip to [place]. He came here from his PCP and states that he is supposed to see GI for a colonoscopy as soon as possible due to bleeding. SLP discovered that his GI appointment was scheduled for November 20 and Pt feels he needs to be sooner. SLP assisted Pt in facilitating an earlier appointment (Sept 20) with Roseanne Kaufman. He is supposed to travel to [place] September 27 with his family and he may cancel his travel due to health concerns. Pt acknowledged that "slowing down" increases fluency and intelligibility. He was reminded of this during session and also to pause and gather his thoughts silently before verbally expressing. He has been using his notebook and calendar at home to facilitate recall. SLP showed him how to use the calendar function on his phone to add appointments, however suspect written calendar/notebook will be more appropriate for him. Continue plan of care and spend more time on word finding next session.  OBJECTIVE IMPAIRMENTS include attention, memory, expressive language, and dysfluency . These impairments are limiting patient from managing medications, managing appointments, and effectively communicating at home and in community. Patient will benefit from skilled SLP services to address above impairments and improve overall  function.  PLAN: SLP FREQUENCY: 1x/week   SLP DURATION: 4 weeks   PLANNED INTERVENTIONS: Cueing hierachy, Cognitive reorganization, Internal/external aids, Functional tasks, Multimodal communication approach, SLP instruction and feedback, Compensatory strategies, Patient/family education, and Re-evaluation       Thank you,   Genene Churn, [place]  [place], [place] 06/28/2022, 11:02 AM

## 2022-07-05 ENCOUNTER — Encounter (HOSPITAL_COMMUNITY): Payer: Self-pay | Admitting: Speech Pathology

## 2022-07-05 ENCOUNTER — Ambulatory Visit (HOSPITAL_COMMUNITY): Payer: Medicare PPO | Admitting: Speech Pathology

## 2022-07-05 DIAGNOSIS — R41841 Cognitive communication deficit: Secondary | ICD-10-CM

## 2022-07-05 NOTE — Therapy (Signed)
OUTPATIENT SPEECH LANGUAGE PATHOLOGY TREATMENT NOTE   Patient Name: Erik Odonnell MRN: [id number] DOB:[birth year], 76 y.o., male Today's Date: 07/05/2022  PCP: Dr. Redmond School REFERRING PROVIDER: Dr. Alric Ran   END OF SESSION:   End of Session - 07/05/22 1048     Visit Number 4    Number of Visits 5    Date for SLP Re-Evaluation 08/02/22    Authorization Type [employer]   EFF DATE-10/23/19  DED-0  OOP MAX-4000.00  VISIT LIMIT-NO  AUTH-YES  COPAY-$20.00   SLP Start Time 1040    SLP Stop Time  1120    SLP Time Calculation (min) 40 min    Activity Tolerance Patient tolerated treatment well             Past Medical History:  Diagnosis Date   Arthritis    Diverticulosis    Hemifacial spasm    left side affected takes Artane daily;    History of bronchitis 11/2013   Hypertension    takes Ziac daily   Joint pain    Past Surgical History:  Procedure Laterality Date   CATARACT EXTRACTION, BILATERAL Bilateral 2015, 2016   COLONOSCOPY     COLONOSCOPY N/A 12/18/2017   Procedure: COLONOSCOPY;  Surgeon: Rogene Houston, MD;  [facility];  Service: Endoscopy;  Laterality: N/A;  930   eye muschle surgeries  as a child   x 5   HERNIA REPAIR Left    as a child-inguinal;right inguinal in [place] Right 03/24/2014   Procedure: TOTAL HIP ARTHROPLASTY ANTERIOR APPROACH;  Surgeon: Ninetta Lights, MD;  Location: [place];  Service: Orthopedics;  Laterality: Right;   TOTAL HIP ARTHROPLASTY Left 08/26/2018   Procedure: LEFT TOTAL HIP ARTHROPLASTY ANTERIOR APPROACH;  Surgeon: Renette Butters, MD;  [facility];  Service: Orthopedics;  Laterality: Left;   Patient Active Problem List   Diagnosis Date Noted   Hemifacial spasm of left side of face 05/18/2022   Primary osteoarthritis of hip 08/26/2018   Primary osteoarthritis of left hip 08/04/2018   Special screening for malignant neoplasms, colon 09/11/2017   Proximal leg weakness  04/07/2014   Difficulty in walking(719.7) 04/07/2014   Impairment of balance 04/07/2014   Primary localized osteoarthrosis, pelvic region and thigh 03/24/2014   ONSET DATE: June 2022    REFERRING DIAG: G31.84 (ICD-10-CM) - Mild cognitive impairment R47.89 (ICD-10-CM) - Word finding difficulty    THERAPY DIAG:  Cognitive communication deficit   Rationale for Evaluation and Treatment Rehabilitation  PERTINENT HISTORY: Mr. Erik Odonnell is a 76 yo male who was referred by Dr. Alric Ran for a cognitive linguistic evaluation and treatment due to Pt reports of difficulty with word finding, mild memory deficits, and occasional verbal dysfluencies. Patient reports that his memory problems and confusion started while being diagnosed with COVID in 2021.  His wife reported he is repetitive and she has to remind him of things. He is a retired high school history Pharmacist, hospital, lives with his wife, and enjoys traveling with family, gardening, watching TV, and staying active. He sees [name] for Botox for his left eye. He indicates that he stuttered when he was in elementary school, but that he had no evidence of this by the time he entered middle/high school. He now finds that he stumbles over his words.    SUBJECTIVE: "I forgot my notebook, but I have been writing things down on this paper."  PAIN:  Are you having  pain? No  GOALS: Goals reviewed with patient? Yes   SHORT TERM GOALS: Target date: 07/25/2022   Pt will implement memory strategies in functional therapy activities with 90% acc with min cues.  Baseline: ~70-85% Goal status: ONGOING   2.  Pt will record 3 or greater weekly appointments, reminders, to-do items in her smart phone to facilitate task completion.  Baseline: Pt uses a calendar at home with assist from spouse Goal status: ONGOING   3.  Pt will implement word-finding strategies with 90% accuracy when unable to verbalize desired word in conversation/functional tasks with min  assist. Baseline: uses occasionally and needs education for strategies Goal status: ONGOING   4.  Pt will complete personally relevant responsive naming tasks with 95% acc with use of written cues as needed. Baseline: ~80-90% Goal status: ONGOING   5.  Pt will implement fluency enhancing and speech intelligibility strategies when generating sentences involving multisyllabic words with 90% acc and min assist. Baseline: introduced today, intermittent dysfluencies noted, rare Goal status: ONGOING     LONG TERM GOALS: Same as short term   OBJECTIVE:   TODAY'S TREATMENT: Pt without dysfluency this session and he indicates that he feels this has improved at home as well. He focuses on slowing down when needed. In session, he completed a moderately complex planning and word finding task to plan items needed for his trip to [place]. He has been packing at home in preparation, but does not have a written list to go by. He required only minimal cues to select all items for his trip. He completed naming to description task with 100% acc and then described objects by salient features with 100% acc after min cues initially. Plan for one more SLP session in preparation for d/c and Pt is in agreement with plan of care. He reports feeling improved cognitively, but still feels physically more tired that usual.    OBJECTIVE IMPAIRMENTS include attention, memory, expressive language, and dysfluency . These impairments are limiting patient from managing medications, managing appointments, and effectively communicating at home and in community. Patient will benefit from skilled SLP services to address above impairments and improve overall function.  PLAN: SLP FREQUENCY: 1x/week   SLP DURATION: 1 weeks   PLANNED INTERVENTIONS: Cueing hierachy, Cognitive reorganization, Internal/external aids, Functional tasks, Multimodal communication approach, SLP instruction and feedback, Compensatory strategies,  Patient/family education, and Re-evaluation       Thank you,   Genene Churn, [place]  [place], [place] 07/05/2022, 10:50 AM

## 2022-07-11 ENCOUNTER — Ambulatory Visit (INDEPENDENT_AMBULATORY_CARE_PROVIDER_SITE_OTHER): Payer: Medicare PPO | Admitting: Gastroenterology

## 2022-07-11 ENCOUNTER — Encounter: Payer: Self-pay | Admitting: Gastroenterology

## 2022-07-11 ENCOUNTER — Telehealth: Payer: Self-pay | Admitting: *Deleted

## 2022-07-11 DIAGNOSIS — K625 Hemorrhage of anus and rectum: Secondary | ICD-10-CM | POA: Diagnosis not present

## 2022-07-11 MED ORDER — HYDROCORTISONE (PERIANAL) 2.5 % EX CREA: 1.0000 | TOPICAL_CREAM | Freq: Two times a day (BID) | CUTANEOUS | 1 refills | Status: DC

## 2022-07-11 NOTE — Progress Notes (Signed)
Gastroenterology Office Note    Referring Provider: Redmond School, MD Primary Care Physician:  Erik School, MD  Primary GI: Dr. Abbey Odonnell   Chief Complaint   Chief Complaint  Patient presents with   Rectal Bleeding    Patient here today due to having issues with rectal bleeding bright red blood and constipation, he says he was doing a lot of straining this all began Sept 1, 2023. He says this has gotten better with the last time he seen blood on Sept 4 and has seen none since. He started taking Miralax on 06/28/2022 and is now taking it once per day and it is doing well with the constipation.     History of Present Illness   Erik Odonnell is a 76 y.o. male presenting today at the request of Erik School, MD due to rectal bleeding. He is present with his wife, Erik Odonnell today. Colonoscopy 2019 by Dr. Laural Odonnell: diverticulosis and internal hemorhoids.   He has had to digitally disimpact during episode of straining. No rectal pain. No itching or burning. No prolapsing tissue. Eats high fiber diet. Was taking Miralax BID but now daily. No straining now with BM daily. 5 minute toilet time. No GERD. No abdominal pain.  Leaving for [place] on 9/27.     Past Medical History:  Diagnosis Date   Arthritis    Diverticulosis    Gout    Hemifacial spasm    left side affected takes Artane daily;    History of bronchitis 11/2013   Hypertension    takes Ziac daily   Joint pain     Past Surgical History:  Procedure Laterality Date   CATARACT EXTRACTION, BILATERAL Bilateral 2015, 2016   COLONOSCOPY     COLONOSCOPY N/A 12/18/2017   Procedure: COLONOSCOPY;  Surgeon: Erik Houston, MD;  [facility];  Service: Endoscopy;  Laterality: N/A;  930   eye muschle surgeries  as a child   x 5   HERNIA REPAIR Left    as a child-inguinal;right inguinal in [place] Right 03/24/2014   Procedure: TOTAL HIP ARTHROPLASTY ANTERIOR APPROACH;  Surgeon: Erik Lights, MD;  Location: [place];  Service: Orthopedics;  Laterality: Right;   TOTAL HIP ARTHROPLASTY Left 08/26/2018   Procedure: LEFT TOTAL HIP ARTHROPLASTY ANTERIOR APPROACH;  Surgeon: Erik Butters, MD;  [facility];  Service: Orthopedics;  Laterality: Left;    Current Outpatient Medications  Medication Sig Dispense Refill   allopurinol (ZYLOPRIM) 300 MG tablet Take 300 mg by mouth daily.     ALPRAZolam (XANAX) 0.5 MG tablet Take 0.25-0.5 mg by mouth at bedtime as needed for anxiety.      bisoprolol-hydrochlorothiazide (ZIAC) 2.5-6.25 MG per tablet Take 1 tablet by mouth daily.     Chlorpheniramine Maleate (CHLORPHEN MALEATE PO) Take 4 mg by mouth as needed.     hydrocortisone (ANUSOL-HC) 2.5 % rectal cream Place 1 Application rectally 2 (two) times daily. As needed for rectal bleeding 30 g 1   ketoconazole (NIZORAL) 2 % cream Apply 1 application topically daily as needed (foot rash).     levothyroxine (SYNTHROID) 50 MCG tablet Take 50 mcg by mouth daily before breakfast.  10   Multiple Vitamin (MULTIVITAMIN) capsule Take 1 capsule by mouth daily.     pramoxine-hydrocortisone (PROCTOCREAM-HC) 1-1 % rectal cream Place 1 Application rectally 2 (two) times daily. AS NEEDED WITH BMS     No current facility-administered medications  for this visit.    Allergies as of 07/11/2022   (No Known Allergies)    Family History  Problem Relation Age of Onset   Colon cancer Neg Hx    Colon polyps Neg Hx     Social History   Socioeconomic History   Marital status: Married    Spouse name: Not on file   Number of children: Not on file   Years of education: Not on file   Highest education level: Not on file  Occupational History   Not on file  Tobacco Use   Smoking status: Never   Smokeless tobacco: Never  Vaping Use   Vaping Use: Never used  Substance and Sexual Activity   Alcohol use: Yes    Comment: couple of drinks at supper   Drug use: No   Sexual activity: Yes  Other Topics  Concern   Not on file  Social History Narrative   Not on file   Social Determinants of Health   Financial Resource Strain: Not on file  Food Insecurity: Not on file  Transportation Needs: Not on file  Physical Activity: Not on file  Stress: Not on file  Social Connections: Not on file  Intimate Partner Violence: Not on file     Review of Systems   Gen: Denies any fever, chills, fatigue, weight loss, lack of appetite.  CV: Denies chest pain, heart palpitations, peripheral edema, syncope.  Resp: Denies shortness of breath at rest or with exertion. Denies wheezing or cough.  GI: Denies dysphagia or odynophagia. Denies jaundice, hematemesis, fecal incontinence. GU : Denies urinary burning, urinary frequency, urinary hesitancy MS: Denies joint pain, muscle weakness, cramps, or limitation of movement.  Derm: Denies rash, itching, dry skin Psych: Denies depression, anxiety, memory loss, and confusion Heme: Denies bruising, bleeding, and enlarged lymph nodes.   Physical Exam   BP 135/86 (BP Location: Left Arm, Patient Position: Sitting, Cuff Size: Large)   Pulse (!) 53   Temp 97.9 F (36.6 C) (Oral)   Ht '5\' 8"'$  (1.727 m)   Wt 215 lb 6.4 oz (97.7 kg)   BMI 32.75 kg/m  General:   Alert and oriented. Pleasant and cooperative. Well-nourished and well-developed.  Head:  Normocephalic and atraumatic. Eyes:  Without icterus Ears:  Normal auditory acuity. Lungs:  Clear to auscultation bilaterally.  Heart:  S1, S2 present without murmurs appreciated.  Abdomen:  +BS, soft, non-tender and non-distended. No HSM noted. No guarding or rebound. No masses appreciated.  Rectal: no thrombosed hemorrhoids externally. DRE without mass or pain.  Msk:  Symmetrical without gross deformities. Normal posture. Extremities:  Without edema. Neurologic:  Alert and  oriented x4;  grossly normal neurologically. Skin:  Intact without significant lesions or rashes. Psych:  Alert and cooperative. Normal  mood and affect.   Assessment   Erik Odonnell is a 76 y.o. male presenting today with recent rectal bleeding in setting of constipation and straining; last colonoscopy in 2019 with diverticulosis and internal hemorrhoids.   Painless rectal bleeding reported. Suspected benign anorectal source and not consistent with diverticular bleed, ischemic colitis, etc. He has had improvement with adding Miralax. Will send in hemorrhoid cream. Recommend colonoscopy in future. He is traveling to [place] next week. As he is stable, can pursue colonoscopy as outpatient when returns.   PLAN   Add Benefiber 2 teaspoons each morning Continue Miralax daily to BID Hemorrhoid cream sent to pharmacy Proceed with colonoscopy by Dr. Abbey Odonnell  in near future: the risks, benefits,  and alternatives have been discussed with the patient in detail. The patient states understanding and desires to proceed.    Annitta Needs, PhD, ANP-BC [facility]

## 2022-07-11 NOTE — H&P (View-Only) (Signed)
Gastroenterology Office Note    Referring Provider: Redmond School, MD Primary Care Physician:  Redmond School, MD  Primary GI: Dr. Abbey Chatters   Chief Complaint   Chief Complaint  Patient presents with   Rectal Bleeding    Patient here today due to having issues with rectal bleeding bright red blood and constipation, he says he was doing a lot of straining this all began Sept 1, 2023. He says this has gotten better with the last time he seen blood on Sept 4 and has seen none since. He started taking Miralax on 06/28/2022 and is now taking it once per day and it is doing well with the constipation.     History of Present Illness   Erik Odonnell is a 76 y.o. male presenting today at the request of Redmond School, MD due to rectal bleeding. He is present with his wife, Myra today. Colonoscopy 2019 by Dr. Laural Golden: diverticulosis and internal hemorhoids.   He has had to digitally disimpact during episode of straining. No rectal pain. No itching or burning. No prolapsing tissue. Eats high fiber diet. Was taking Miralax BID but now daily. No straining now with BM daily. 5 minute toilet time. No GERD. No abdominal pain.  Leaving for [place] on 9/27.     Past Medical History:  Diagnosis Date   Arthritis    Diverticulosis    Gout    Hemifacial spasm    left side affected takes Artane daily;    History of bronchitis 11/2013   Hypertension    takes Ziac daily   Joint pain     Past Surgical History:  Procedure Laterality Date   CATARACT EXTRACTION, BILATERAL Bilateral 2015, 2016   COLONOSCOPY     COLONOSCOPY N/A 12/18/2017   Procedure: COLONOSCOPY;  Surgeon: Rogene Houston, MD;  [facility];  Service: Endoscopy;  Laterality: N/A;  930   eye muschle surgeries  as a child   x 5   HERNIA REPAIR Left    as a child-inguinal;right inguinal in [place] Right 03/24/2014   Procedure: TOTAL HIP ARTHROPLASTY ANTERIOR APPROACH;  Surgeon: Ninetta Lights, MD;  Location: [facility];  Service: Orthopedics;  Laterality: Right;   TOTAL HIP ARTHROPLASTY Left 08/26/2018   Procedure: LEFT TOTAL HIP ARTHROPLASTY ANTERIOR APPROACH;  Surgeon: Renette Butters, MD;  Location: [facility];  Service: Orthopedics;  Laterality: Left;    Current Outpatient Medications  Medication Sig Dispense Refill   allopurinol (ZYLOPRIM) 300 MG tablet Take 300 mg by mouth daily.     ALPRAZolam (XANAX) 0.5 MG tablet Take 0.25-0.5 mg by mouth at bedtime as needed for anxiety.      bisoprolol-hydrochlorothiazide (ZIAC) 2.5-6.25 MG per tablet Take 1 tablet by mouth daily.     Chlorpheniramine Maleate (CHLORPHEN MALEATE PO) Take 4 mg by mouth as needed.     hydrocortisone (ANUSOL-HC) 2.5 % rectal cream Place 1 Application rectally 2 (two) times daily. As needed for rectal bleeding 30 g 1   ketoconazole (NIZORAL) 2 % cream Apply 1 application topically daily as needed (foot rash).     levothyroxine (SYNTHROID) 50 MCG tablet Take 50 mcg by mouth daily before breakfast.  10   Multiple Vitamin (MULTIVITAMIN) capsule Take 1 capsule by mouth daily.     pramoxine-hydrocortisone (PROCTOCREAM-HC) 1-1 % rectal cream Place 1 Application rectally 2 (two) times daily. AS NEEDED WITH BMS     No current facility-administered medications  for this visit.    Allergies as of 07/11/2022   (No Known Allergies)    Family History  Problem Relation Age of Onset   Colon cancer Neg Hx    Colon polyps Neg Hx     Social History   Socioeconomic History   Marital status: Married    Spouse name: Not on file   Number of children: Not on file   Years of education: Not on file   Highest education level: Not on file  Occupational History   Not on file  Tobacco Use   Smoking status: Never   Smokeless tobacco: Never  Vaping Use   Vaping Use: Never used  Substance and Sexual Activity   Alcohol use: Yes    Comment: couple of drinks at supper   Drug use: No   Sexual activity: Yes  Other Topics  Concern   Not on file  Social History Narrative   Not on file   Social Determinants of Health   Financial Resource Strain: Not on file  Food Insecurity: Not on file  Transportation Needs: Not on file  Physical Activity: Not on file  Stress: Not on file  Social Connections: Not on file  Intimate Partner Violence: Not on file     Review of Systems   Gen: Denies any fever, chills, fatigue, weight loss, lack of appetite.  CV: Denies chest pain, heart palpitations, peripheral edema, syncope.  Resp: Denies shortness of breath at rest or with exertion. Denies wheezing or cough.  GI: Denies dysphagia or odynophagia. Denies jaundice, hematemesis, fecal incontinence. GU : Denies urinary burning, urinary frequency, urinary hesitancy MS: Denies joint pain, muscle weakness, cramps, or limitation of movement.  Derm: Denies rash, itching, dry skin Psych: Denies depression, anxiety, memory loss, and confusion Heme: Denies bruising, bleeding, and enlarged lymph nodes.   Physical Exam   BP 135/86 (BP Location: Left Arm, Patient Position: Sitting, Cuff Size: Large)   Pulse (!) 53   Temp 97.9 F (36.6 C) (Oral)   Ht '5\' 8"'$  (1.727 m)   Wt 215 lb 6.4 oz (97.7 kg)   BMI 32.75 kg/m  General:   Alert and oriented. Pleasant and cooperative. Well-nourished and well-developed.  Head:  Normocephalic and atraumatic. Eyes:  Without icterus Ears:  Normal auditory acuity. Lungs:  Clear to auscultation bilaterally.  Heart:  S1, S2 present without murmurs appreciated.  Abdomen:  +BS, soft, non-tender and non-distended. No HSM noted. No guarding or rebound. No masses appreciated.  Rectal: no thrombosed hemorrhoids externally. DRE without mass or pain.  Msk:  Symmetrical without gross deformities. Normal posture. Extremities:  Without edema. Neurologic:  Alert and  oriented x4;  grossly normal neurologically. Skin:  Intact without significant lesions or rashes. Psych:  Alert and cooperative. Normal  mood and affect.   Assessment   THAINE Erik Odonnell is a 76 y.o. male presenting today with recent rectal bleeding in setting of constipation and straining; last colonoscopy in 2019 with diverticulosis and internal hemorrhoids.   Painless rectal bleeding reported. Suspected benign anorectal source and not consistent with diverticular bleed, ischemic colitis, etc. He has had improvement with adding Miralax. Will send in hemorrhoid cream. Recommend colonoscopy in future. He is traveling to [place] next week. As he is stable, can pursue colonoscopy as outpatient when returns.   PLAN   Add Benefiber 2 teaspoons each morning Continue Miralax daily to BID Hemorrhoid cream sent to pharmacy Proceed with colonoscopy by Dr. Abbey Chatters  in near future: the risks, benefits,  and alternatives have been discussed with the patient in detail. The patient states understanding and desires to proceed.    Annitta Needs, PhD, ANP-BC [facility] Gastroenterology

## 2022-07-11 NOTE — Telephone Encounter (Signed)
Called pt, LMOVM to call back to schedule TCS with Dr. Abbey Chatters, ASA 2 in October

## 2022-07-11 NOTE — Patient Instructions (Signed)
Have a wonderful time in [place]!  Add Benefiber 2 teaspoons each morning to the beverage of your choice. Continue Miralax daily and can increase to twice a day if needed.  I have sent in a cream to your pharmacy to use twice a day if recurrent bleeding.   Avoid straining, limit toilet time to 2-3 minutes.  We are arranging a colonoscopy in the near future!  It was a pleasure to see you today. I want to create trusting relationships with patients to provide genuine, compassionate, and quality care. I value your feedback. If you receive a survey regarding your visit,  I greatly appreciate you taking time to fill this out.   Annitta Needs, PhD, ANP-BC [facility] Gastroenterology

## 2022-07-12 ENCOUNTER — Encounter (HOSPITAL_COMMUNITY): Payer: Self-pay | Admitting: Speech Pathology

## 2022-07-12 ENCOUNTER — Ambulatory Visit (HOSPITAL_COMMUNITY): Payer: Medicare PPO | Admitting: Speech Pathology

## 2022-07-12 DIAGNOSIS — R41841 Cognitive communication deficit: Secondary | ICD-10-CM | POA: Diagnosis not present

## 2022-07-12 NOTE — Therapy (Signed)
OUTPATIENT SPEECH LANGUAGE PATHOLOGY TREATMENT NOTE   Patient Name: Erik Odonnell MRN: [id number] DOB:1946/02/11, 76 y.o., male Today's Date: 07/12/2022  PCP: Dr. Redmond School REFERRING PROVIDER: Dr. Alric Ran   END OF SESSION:   End of Session - 07/12/22 1037     Visit Number 5    Number of Visits 5    Date for SLP Re-Evaluation 08/02/22    Authorization Type [employer]   EFF DATE-10/23/19  DED-0  OOP MAX-4000.00  VISIT LIMIT-NO  AUTH-YES  COPAY-$20.00   SLP Start Time 1035    SLP Stop Time  1120    SLP Time Calculation (min) 45 min    Activity Tolerance Patient tolerated treatment well             Past Medical History:  Diagnosis Date   Arthritis    Diverticulosis    Gout    Hemifacial spasm    left side affected takes Artane daily;    History of bronchitis 11/2013   Hypertension    takes Ziac daily   Joint pain    Past Surgical History:  Procedure Laterality Date   CATARACT EXTRACTION, BILATERAL Bilateral 2015, 2016   COLONOSCOPY     COLONOSCOPY N/A 12/18/2017   diverticulosis and internal hemorhoids.   eye muschle surgeries  as a child   x 5   HERNIA REPAIR Left    as a child-inguinal;right inguinal in [place] Right 03/24/2014   Procedure: TOTAL HIP ARTHROPLASTY ANTERIOR APPROACH;  Surgeon: Ninetta Lights, MD;  Location: [facility];  Service: Orthopedics;  Laterality: Right;   TOTAL HIP ARTHROPLASTY Left 08/26/2018   Procedure: LEFT TOTAL HIP ARTHROPLASTY ANTERIOR APPROACH;  Surgeon: Renette Butters, MD;  [facility];  Service: Orthopedics;  Laterality: Left;   Patient Active Problem List   Diagnosis Date Noted   Rectal bleeding 07/11/2022   Hemifacial spasm of left side of face 05/18/2022   Primary osteoarthritis of hip 08/26/2018   Primary osteoarthritis of left hip 08/04/2018   Special screening for malignant neoplasms, colon 09/11/2017   Proximal leg weakness 04/07/2014   Difficulty in walking(719.7)  04/07/2014   Impairment of balance 04/07/2014   Primary localized osteoarthrosis, pelvic region and thigh 03/24/2014   ONSET DATE: June 2022    REFERRING DIAG: G31.84 (ICD-10-CM) - Mild cognitive impairment R47.89 (ICD-10-CM) - Word finding difficulty    THERAPY DIAG:  Cognitive communication deficit   Rationale for Evaluation and Treatment Rehabilitation  PERTINENT HISTORY: Mr. Erik Odonnell is a 76 yo male who was referred by Dr. Alric Ran for a cognitive linguistic evaluation and treatment due to Pt reports of difficulty with word finding, mild memory deficits, and occasional verbal dysfluencies. Patient reports that his memory problems and confusion started while being diagnosed with COVID in 2021.  His wife reported he is repetitive and she has to remind him of things. He is a retired high school history Pharmacist, hospital, lives with his wife, and enjoys traveling with family, gardening, watching TV, and staying active. He sees [name] for Botox for his left eye. He indicates that he stuttered when he was in elementary school, but that he had no evidence of this by the time he entered middle/high school. He now finds that he stumbles over his words.    SUBJECTIVE: "I have my calendar at home."  PAIN:  Are you having pain? No  GOALS: Goals reviewed with patient? Yes   SHORT TERM GOALS: Target date:  07/25/2022   Pt will implement memory strategies in functional therapy activities with 90% acc with min cues.  Baseline: ~70-85% Goal status: Partially Met, needs association cues   2.  Pt will record 3 or greater weekly appointments, reminders, to-do items in her smart phone to facilitate task completion.  Baseline: Pt uses a calendar at home with assist from spouse Goal status: Met   3.  Pt will implement word-finding strategies with 90% accuracy when unable to verbalize desired word in conversation/functional tasks with min assist. Baseline: uses occasionally and needs education for  strategies Goal status: Met   4.  Pt will complete personally relevant responsive naming tasks with 95% acc with use of written cues as needed. Baseline: ~80-90% Goal status: Met   5.  Pt will implement fluency enhancing and speech intelligibility strategies when generating sentences involving multisyllabic words with 90% acc and min assist. Baseline: introduced today, intermittent dysfluencies noted, rare Goal status: Met     LONG TERM GOALS: Same as short term   OBJECTIVE:   TODAY'S TREATMENT: Pt without dysfluency this session and he indicates that he feels this has improved at home as well. He focuses on slowing down when needed. Pt met 4 of 5 goals and partially met the memory goal. Pt continues to have difficulty with sustained attention and working memory, however he benefits from use of compensatory strategies (writing information down and use of association cues). He has been recording pertinent information down in his notebook and calendar. He does need reminders to bring his calendar, notebook, and/or cell phone with him to appointments. He completed a memory task with SLP in session (Going to [place]) with moderate cues via SLP cues for association reminders. He reports that the information is "gone", however once provided the association cue that he helped create, he generally was able to recall the word. Pt reports feeling satisfied with his progress and feels that therapy has helped. He should continue to use compensatory strategies going forward. Will d/c from SLP services at this time.    OBJECTIVE IMPAIRMENTS include attention, memory, expressive language, and dysfluency . These impairments are limiting patient from managing medications, managing appointments, and effectively communicating at home and in community. Patient will benefit from skilled SLP services to address above impairments and improve overall function.  PLAN: Discharge from SLP services.  SPEECH  THERAPY DISCHARGE SUMMARY  Visits from Start of Care: 5  Current functional level related to goals / functional outcomes: Pt met 4 of 5 goals and partially met one.   Remaining deficits: See above, working memory and attention deficit   Education / Equipment: Completed, continue with use of calendar, phone, and small portable notebook   Patient agrees to discharge. Patient goals were met. Patient is being discharged due to meeting the stated rehab goals..          Thank you,   Genene Churn, [place]  [place], [place] 07/12/2022, 10:50 AM

## 2022-07-16 ENCOUNTER — Encounter: Payer: Self-pay | Admitting: *Deleted

## 2022-07-16 MED ORDER — PEG 3350-KCL-NA BICARB-NACL 420 G PO SOLR: 4000.0000 mL | Freq: Once | ORAL | 0 refills | Status: AC

## 2022-07-16 NOTE — Telephone Encounter (Signed)
Patient has been scheduled for 08/07/22 at 11:00. Instructed pt to be there at 9:30 am. Instructions will be mailed. Prep sent to the pharmacy.   OH:[id number] Authorization [id number]  Tracking #[id number]

## 2022-07-30 ENCOUNTER — Telehealth: Payer: Self-pay | Admitting: Gastroenterology

## 2022-07-30 NOTE — Telephone Encounter (Signed)
Returned the pt's call and was advised that in [place] the pt had diarrhea and they got back this morning and he has a large hemorrhoid that is very painful when sitting (at a 9), (5) standing up. No bleeding as of yet. Very large hemorrhoid that's painful

## 2022-07-30 NOTE — Telephone Encounter (Signed)
Wife left a message that the patient was seen before they left for [place] and she called back today and said he was having issues.    Dena, I got the message from you but do I need to schedule him for an appointment?  I didn't know if you had spoken to her and I didn't see a phone encounter.

## 2022-07-31 ENCOUNTER — Other Ambulatory Visit: Payer: Self-pay

## 2022-07-31 DIAGNOSIS — Z6832 Body mass index (BMI) 32.0-32.9, adult: Secondary | ICD-10-CM | POA: Diagnosis not present

## 2022-07-31 DIAGNOSIS — E6609 Other obesity due to excess calories: Secondary | ICD-10-CM | POA: Diagnosis not present

## 2022-07-31 DIAGNOSIS — K648 Other hemorrhoids: Secondary | ICD-10-CM | POA: Diagnosis not present

## 2022-07-31 DIAGNOSIS — J069 Acute upper respiratory infection, unspecified: Secondary | ICD-10-CM | POA: Diagnosis not present

## 2022-07-31 MED ORDER — HYDROCORTISONE (PERIANAL) 2.5 % EX CREA: 1.0000 | TOPICAL_CREAM | Freq: Two times a day (BID) | CUTANEOUS | 1 refills | Status: DC

## 2022-07-31 NOTE — Telephone Encounter (Signed)
Phoned and LMOVM for the pt to return call 

## 2022-07-31 NOTE — Addendum Note (Signed)
Addended by: Annitta Needs on: 07/31/2022 03:33 PM   Modules accepted: Orders

## 2022-07-31 NOTE — Telephone Encounter (Signed)
Phoned and spoke to the pt's wife and advised her of the Rx sent in for her husband and the directions. Pt's wife stated she will advise the t once he returns from the pharmacy. He went to pick up something else and he didn't take his phone.

## 2022-07-31 NOTE — Progress Notes (Signed)
error 

## 2022-07-31 NOTE — Telephone Encounter (Signed)
I could see him tomorrow (10/11) at 1130 if needed at [facility] office.

## 2022-07-31 NOTE — Telephone Encounter (Signed)
I resent Anusol to [facility]. I recommend he use it twice a day topically.

## 2022-07-31 NOTE — Telephone Encounter (Signed)
Spoke with the pt and was advised he never seen or received a Rx for Anusol. Gave the pt his directions/recommendations and he stated that it seems to be a little better today. He will see what it feels like tonight and give [place] a call if he feels like he needs to see [place]. He stated it has been painful and swollen for about 5 days and he had a 20 hr flight home from [place]. He went to the pharmacy but only picked up the jug of solution for his colonoscopy. He asked his wife but neither remembered being told to go pick up a cream. So pt stated he will call back if he needs the visit.

## 2022-07-31 NOTE — Telephone Encounter (Signed)
He should still have some cream that I sent in. Use this 3-4 times a day. May use sitz baths. Can use warm compresses as well. If still very painful, we can see if he is able to come for a visit to look at it, or he can try supportive measures first. How long has it been painful and swollen?

## 2022-07-31 NOTE — Telephone Encounter (Signed)
Returned the pt's call from my vm and LMOVM for the pt  to return call

## 2022-08-07 ENCOUNTER — Encounter (HOSPITAL_COMMUNITY)
Admission: RE | Disposition: A | Discharge status: Home / Self Care | Payer: Self-pay | Source: Home / Self Care | Attending: Internal Medicine

## 2022-08-07 ENCOUNTER — Other Ambulatory Visit: Payer: Self-pay

## 2022-08-07 ENCOUNTER — Ambulatory Visit (HOSPITAL_COMMUNITY): Payer: Medicare PPO | Admitting: Anesthesiology

## 2022-08-07 ENCOUNTER — Encounter (HOSPITAL_COMMUNITY): Payer: Self-pay

## 2022-08-07 ENCOUNTER — Ambulatory Visit (HOSPITAL_COMMUNITY)
Admission: RE | Admit: 2022-08-07 | Discharge: 2022-08-07 | Disposition: A | Discharge status: Home / Self Care | Payer: Medicare PPO | Attending: Internal Medicine | Admitting: Internal Medicine

## 2022-08-07 ENCOUNTER — Ambulatory Visit (HOSPITAL_BASED_OUTPATIENT_CLINIC_OR_DEPARTMENT_OTHER): Payer: Medicare PPO | Admitting: Anesthesiology

## 2022-08-07 DIAGNOSIS — D123 Benign neoplasm of transverse colon: Secondary | ICD-10-CM | POA: Diagnosis not present

## 2022-08-07 DIAGNOSIS — K5790 Diverticulosis of intestine, part unspecified, without perforation or abscess without bleeding: Secondary | ICD-10-CM | POA: Diagnosis not present

## 2022-08-07 DIAGNOSIS — K625 Hemorrhage of anus and rectum: Secondary | ICD-10-CM | POA: Diagnosis not present

## 2022-08-07 DIAGNOSIS — K649 Unspecified hemorrhoids: Secondary | ICD-10-CM

## 2022-08-07 DIAGNOSIS — Q438 Other specified congenital malformations of intestine: Secondary | ICD-10-CM | POA: Insufficient documentation

## 2022-08-07 DIAGNOSIS — K635 Polyp of colon: Secondary | ICD-10-CM | POA: Diagnosis not present

## 2022-08-07 DIAGNOSIS — K648 Other hemorrhoids: Secondary | ICD-10-CM | POA: Insufficient documentation

## 2022-08-07 DIAGNOSIS — D125 Benign neoplasm of sigmoid colon: Secondary | ICD-10-CM

## 2022-08-07 DIAGNOSIS — G709 Myoneural disorder, unspecified: Secondary | ICD-10-CM | POA: Diagnosis not present

## 2022-08-07 HISTORY — PX: POLYPECTOMY: SHX5525

## 2022-08-07 HISTORY — PX: COLONOSCOPY WITH PROPOFOL: SHX5780

## 2022-08-07 SURGERY — COLONOSCOPY WITH PROPOFOL
Anesthesia: General

## 2022-08-07 MED ORDER — LIDOCAINE HCL (CARDIAC) PF 100 MG/5ML IV SOSY
PREFILLED_SYRINGE | INTRAVENOUS | Status: DC | PRN
  Administered 2022-08-07: 50 mg via INTRAVENOUS

## 2022-08-07 MED ORDER — PROPOFOL 500 MG/50ML IV EMUL
INTRAVENOUS | Status: DC | PRN
  Administered 2022-08-07: 150 ug/kg/min via INTRAVENOUS

## 2022-08-07 MED ORDER — LACTATED RINGERS IV SOLN: INTRAVENOUS | Status: DC

## 2022-08-07 MED ORDER — EPHEDRINE SULFATE (PRESSORS) 50 MG/ML IJ SOLN
INTRAMUSCULAR | Status: DC | PRN
  Administered 2022-08-07 (×3): 5 mg via INTRAVENOUS

## 2022-08-07 MED ORDER — LACTATED RINGERS IV SOLN: INTRAVENOUS | Status: DC | PRN

## 2022-08-07 MED ORDER — PROPOFOL 10 MG/ML IV BOLUS
INTRAVENOUS | Status: DC | PRN
  Administered 2022-08-07: 50 mg via INTRAVENOUS

## 2022-08-07 NOTE — Transfer of Care (Signed)
Immediate Anesthesia Transfer of Care Note  Patient: Erik Odonnell  Procedure(s) Performed: COLONOSCOPY WITH PROPOFOL POLYPECTOMY  Patient Location: PACU  Anesthesia Type:General  Level of Consciousness: awake, drowsy and patient cooperative  Airway & Oxygen Therapy: Patient Spontanous Breathing  Post-op Assessment: Report given to RN, Post -op Vital signs reviewed and stable and Patient moving all extremities X 4  Post vital signs: Reviewed and stable  Last Vitals:  Vitals Value Taken Time  BP    Temp    Pulse    Resp    SpO2      Last Pain:  Vitals:   08/07/22 1102  TempSrc:   PainSc: 0-No pain      Patients Stated Pain Goal: 6 (03/15/90 0289)  Complications: No notable events documented.

## 2022-08-07 NOTE — Op Note (Signed)
[facility] Patient Name: Erik Odonnell Procedure Date: 08/07/2022 10:58 AM MRN: [id number] Date of Birth: Aug 21, 1946 Attending MD: Elon Alas. Abbey Chatters DO CSN: [id number] Age: 76 Admit Type: Outpatient Procedure:                Colonoscopy Indications:              Rectal bleeding Providers:                Elon Alas. Abbey Chatters, DO, Lambert Mody, Everardo [place] Referring MD:             Elon Alas. Abbey Chatters, DO Medicines:                See the Anesthesia note for documentation of the                            administered medications Complications:            No immediate complications. Estimated Blood Loss:     Estimated blood loss was minimal. Procedure:                Pre-Anesthesia Assessment:                           - The anesthesia plan was to use monitored                            anesthesia care (MAC).                           After obtaining informed consent, the colonoscope                            was passed under direct vision. Throughout the                            procedure, the patient's blood pressure, pulse, and                            oxygen saturations were monitored continuously. The                            [id number]-[id number] ([id number]) scope was introduced through                            the anus and advanced to the the cecum, identified                            by appendiceal orifice and ileocecal valve. The                            colonoscopy was technically difficult and complex                            due to a redundant colon, significant looping and  a                            tortuous colon. Successful completion of the                            procedure was aided by applying abdominal pressure.                            The patient tolerated the procedure well. The                            quality of the bowel preparation was evaluated                            using the BBPS [facility] Bowel  Preparation Scale)                            with scores of: Right Colon = 3, Transverse Colon =                            3 and Left Colon = 3 (entire mucosa seen well with                            no residual staining, small fragments of stool or                            opaque liquid). The total BBPS score equals 9. Scope In: 11:06:02 AM Scope Out: 11:35:49 AM Scope Withdrawal Time: 0 hours 8 minutes 39 seconds  Total Procedure Duration: 0 hours 29 minutes 47 seconds  Findings:      Hemorrhoids were found on perianal exam.      Non-bleeding internal hemorrhoids were found during endoscopy.      Many small and large-mouthed diverticula were found in the sigmoid       colon, descending colon and transverse colon.      Two sessile polyps were found in the sigmoid colon and transverse colon.       The polyps were 3 to 5 mm in size. These polyps were removed with a cold       snare. Resection and retrieval were complete.      The transverse colon was significantly redundant. Advancing the scope       required applying abdominal pressure.      The exam was otherwise without abnormality. Impression:               - Hemorrhoids found on perianal exam.                           - Non-bleeding internal hemorrhoids.                           - Diverticulosis in the sigmoid colon, in the                            descending colon and in the transverse colon.                           -  Two 3 to 5 mm polyps in the sigmoid colon and in                            the transverse colon, removed with a cold snare.                            Resected and retrieved.                           - Redundant colon.                           - The examination was otherwise normal. Moderate Sedation:      Per Anesthesia Care Recommendation:           - Patient has a contact number available for                            emergencies. The signs and symptoms of potential                             delayed complications were discussed with the                            patient. Return to normal activities tomorrow.                            Written discharge instructions were provided to the                            patient.                           - Resume previous diet.                           - Continue present medications.                           - Await pathology results.                           - No repeat colonoscopy due to age.                           - Return to GI clinic in 3 months. Procedure Code(s):        --- Professional ---                           [phone], Colonoscopy, flexible; with removal of                            tumor(s), polyp(s), or other lesion(s) by snare                            technique Diagnosis Code(s):        ---  Professional ---                           K64.8, Other hemorrhoids                           K63.5, Polyp of colon                           K62.5, Hemorrhage of anus and rectum                           K57.30, Diverticulosis of large intestine without                            perforation or abscess without bleeding                           Q43.8, Other specified congenital malformations of                            intestine CPT copyright 2019 American Medical Association. All rights reserved. The codes documented in this report are preliminary and upon coder review may  be revised to meet current compliance requirements. Elon Alas. Abbey Chatters, DO [place] Abbey Chatters, DO 08/07/2022 11:39:50 AM This report has been signed electronically. Number of Addenda: 0

## 2022-08-07 NOTE — Anesthesia Preprocedure Evaluation (Signed)
Anesthesia Evaluation  Patient identified by MRN, date of birth, ID band Patient awake    Reviewed: Allergy & Precautions, H&P , NPO status , Patient's Chart, lab work & pertinent test results, reviewed documented beta blocker date and time   Airway Mallampati: II  TM Distance: >3 FB Neck ROM: full    Dental no notable dental hx.    Pulmonary neg pulmonary ROS,    Pulmonary exam normal breath sounds clear to auscultation       Cardiovascular Exercise Tolerance: Good hypertension, negative cardio ROS   Rhythm:regular Rate:Normal     Neuro/Psych  Neuromuscular disease negative psych ROS   GI/Hepatic negative GI ROS, Neg liver ROS,   Endo/Other  negative endocrine ROS  Renal/GU negative Renal ROS  negative genitourinary   Musculoskeletal   Abdominal   Peds  Hematology negative hematology ROS (+)   Anesthesia Other Findings   Reproductive/Obstetrics negative OB ROS                             Anesthesia Physical Anesthesia Plan  ASA: 2  Anesthesia Plan: General   Post-op Pain Management:    Induction:   PONV Risk Score and Plan: Propofol infusion  Airway Management Planned:   Additional Equipment:   Intra-op Plan:   Post-operative Plan:   Informed Consent: I have reviewed the patients History and Physical, chart, labs and discussed the procedure including the risks, benefits and alternatives for the proposed anesthesia with the patient or authorized representative who has indicated his/her understanding and acceptance.     Dental Advisory Given  Plan Discussed with: CRNA  Anesthesia Plan Comments:         Anesthesia Quick Evaluation

## 2022-08-07 NOTE — Interval H&P Note (Signed)
History and Physical Interval Note:  08/07/2022 10:56 AM  Erik Odonnell  has presented today for surgery, with the diagnosis of rectal bleeding.  The various methods of treatment have been discussed with the patient and family. After consideration of risks, benefits and other options for treatment, the patient has consented to  Procedure(s) with comments: COLONOSCOPY WITH PROPOFOL (N/A) - 11:00 am as a surgical intervention.  The patient's history has been reviewed, patient examined, no change in status, stable for surgery.  I have reviewed the patient's chart and labs.  Questions were answered to the patient's satisfaction.     Eloise Harman

## 2022-08-07 NOTE — Discharge Instructions (Addendum)
  Colonoscopy Discharge Instructions  Read the instructions outlined below and refer to this sheet in the next few weeks. These discharge instructions provide you with general information on caring for yourself after you leave the hospital. Your doctor may also give you specific instructions. While your treatment has been planned according to the most current medical practices available, unavoidable complications occasionally occur.   ACTIVITY You may resume your regular activity, but move at a slower pace for the next 24 hours.  Take frequent rest periods for the next 24 hours.  Walking will help get rid of the air and reduce the bloated feeling in your belly (abdomen).  No driving for 24 hours (because of the medicine (anesthesia) used during the test).   Do not sign any important legal documents or operate any machinery for 24 hours (because of the anesthesia used during the test).  NUTRITION Drink plenty of fluids.  You may resume your normal diet as instructed by your doctor.  Begin with a light meal and progress to your normal diet. Heavy or fried foods are harder to digest and may make you feel sick to your stomach (nauseated).  Avoid alcoholic beverages for 24 hours or as instructed.  MEDICATIONS You may resume your normal medications unless your doctor tells you otherwise.  WHAT YOU CAN EXPECT TODAY Some feelings of bloating in the abdomen.  Passage of more gas than usual.  Spotting of blood in your stool or on the toilet paper.  IF YOU HAD POLYPS REMOVED DURING THE COLONOSCOPY: No aspirin products for 7 days or as instructed.  No alcohol for 7 days or as instructed.  Eat a soft diet for the next 24 hours.  FINDING OUT THE RESULTS OF YOUR TEST Not all test results are available during your visit. If your test results are not back during the visit, make an appointment with your caregiver to find out the results. Do not assume everything is normal if you have not heard from your  caregiver or the medical facility. It is important for you to follow up on all of your test results.  SEEK IMMEDIATE MEDICAL ATTENTION IF: You have more than a spotting of blood in your stool.  Your belly is swollen (abdominal distention).  You are nauseated or vomiting.  You have a temperature over 101.  You have abdominal pain or discomfort that is severe or gets worse throughout the day.   Your colonoscopy revealed 2 polyp(s) which I removed successfully. Await pathology results, my office will contact you. No repeat colonoscopy due to age  You also have diverticulosis and internal hemorrhoids. I would recommend increasing fiber in your diet or adding OTC Benefiber/Metamucil. Be sure to drink at least 4 to 6 glasses of water daily. Follow-up with GI in 3 months    I hope you have a great rest of your week!  Elon Alas. Abbey Chatters, D.O. Gastroenterology and Hepatology [facility]

## 2022-08-08 ENCOUNTER — Ambulatory Visit: Payer: Medicare PPO | Admitting: Neurology

## 2022-08-08 LAB — SURGICAL PATHOLOGY

## 2022-08-09 NOTE — Anesthesia Postprocedure Evaluation (Signed)
Anesthesia Post Note  Patient: Erik Odonnell  Procedure(s) Performed: COLONOSCOPY WITH PROPOFOL POLYPECTOMY  Patient location during evaluation: Phase II Anesthesia Type: General Level of consciousness: awake Pain management: pain level controlled Vital Signs Assessment: post-procedure vital signs reviewed and stable Respiratory status: spontaneous breathing and respiratory function stable Cardiovascular status: blood pressure returned to baseline and stable Postop Assessment: no headache and no apparent nausea or vomiting Anesthetic complications: no Comments: Late entry   No notable events documented.   Last Vitals:  Vitals:   08/07/22 0949 08/07/22 1140  BP: (!) 158/90 (!) 108/59  Pulse: (!) 53 62  Resp: 18 18  Temp: 36.6 C 36.5 C  SpO2: 97% 95%    Last Pain:  Vitals:   08/07/22 1148  TempSrc:   PainSc: 0-No pain                 Louann Sjogren

## 2022-08-10 ENCOUNTER — Encounter (HOSPITAL_COMMUNITY): Payer: Self-pay | Admitting: Internal Medicine

## 2022-08-23 ENCOUNTER — Telehealth: Payer: Self-pay

## 2022-08-23 ENCOUNTER — Encounter: Payer: Self-pay | Admitting: Neurology

## 2022-08-23 ENCOUNTER — Ambulatory Visit: Payer: Medicare PPO | Admitting: Neurology

## 2022-08-23 VITALS — BP 158/99 | HR 54

## 2022-08-23 DIAGNOSIS — G5132 Clonic hemifacial spasm, left: Secondary | ICD-10-CM | POA: Diagnosis not present

## 2022-08-23 MED ORDER — ONABOTULINUMTOXINA 100 UNITS IJ SOLR: 100.0000 [IU] | Freq: Once | INTRAMUSCULAR | Status: AC

## 2022-08-23 NOTE — Telephone Encounter (Signed)
noted 

## 2022-08-23 NOTE — Progress Notes (Signed)
Botox 100 units x 1 vial [id number] [phone] Exp-12/2024 B/B

## 2022-08-23 NOTE — Progress Notes (Signed)
No chief complaint on file.     ASSESSMENT AND PLAN  Erik Odonnell is a 76 y.o. male  Left hemifacial spasm,  Had previous botulism toxin injection by Dr. Merlene Laughter before, did report improvement with the injection  Long history of strabismus, multiple surgery in the past, still only use 1 eye as dominant eye, apparent bilateral esophoria, left worse than right,  EMG guided xeomin injection, 100 units of BOTOX was dissolved into 1 cc of normal saline, used 42.5 units, discard  57.5 units  Left orbicularis oculi, injection was performed at 2,3,4,5,6,7, 8, 10 o'clock position,(2.5 units x 8=20 units) Right orbicularis oculi, at 7,8, 9 position (2.5 units x3=7.5 units).  Right corrugate 5 units Left corrugate 5 units Process 5 units   DIAGNOSTIC DATA (LABS, IMAGING, TESTING) - I reviewed patient records, labs, notes, testing and imaging myself where available.   MEDICAL HISTORY:  Erik Odonnell is a 76 year old male, patient of Dr. April Manson, for evaluation EMG guided botulism toxin injection for left hemifacial spasm,  I reviewed and summarized the referring note.  Past medical history Hypertension Hypothyroidism on supplement  He was born " crossed eyed", had multiple eye surgery, still has difficulty fused imaging from 2 eyes, he tends to use 1 eye as dominant eye, either left, or right, around 1990, he noticed gradual onset left eye muscle twitching, was diagnosed with left hemifacial spasm, had a few sessions of botulism toxin injection few years back by a local neurologist Dr.[place], did help his left facial spasm  But he has stopped going to his office, now he has worsening left eye twitching, to the point of covering his left vision majority of the time,   Laboratory evaluations,  UPDATE Aug 23 2022: Injection does help him opening up left eye, but the benefit was short lasting    PHYSICAL EXAM:    Vitals:   08/23/22 1645  BP: (!) 158/99  Pulse: (!) 54       NEUROLOGICAL EXAM:  CN III, IV, VI: Constant left orbicularis oculi muscle twitching, to the point of cover left eye, only mild extension to left cheek muscle, left frontalis, apparent left esotropia, lesser degree of right esotropia,---Report left eye was his dominant eye CN V: Facial sensation is intact to light touch CN VII: Face is symmetric with normal eye closure  CN VIII: Hearing is normal to causal conversation. CN IX, X: Phonation is normal. CN XI: Head turning and shoulder shrug are intact  MOTOR: There is no pronator drift of out-stretched arms. Muscle bulk and tone are normal. Muscle strength is normal.  REFLEXES: Reflexes are 2+ and symmetric at the biceps, triceps, knees, and ankles. Plantar responses are flexor.  SENSORY: Intact to light touch, pinprick and vibratory sensation are intact in fingers and toes.  COORDINATION: There is no trunk or limb dysmetria noted.  GAIT/STANCE: Need to push-up to get up from seated position, antalgic  REVIEW OF SYSTEMS:  Full 14 system review of systems performed and notable only for as above All other review of systems were negative.   ALLERGIES: No Known Allergies  HOME MEDICATIONS: Current Outpatient Medications  Medication Sig Dispense Refill   allopurinol (ZYLOPRIM) 300 MG tablet Take 300 mg by mouth daily.     ALPRAZolam (XANAX) 0.5 MG tablet Take 0.25-0.5 mg by mouth at bedtime as needed for sleep.     amoxicillin (AMOXIL) 875 MG tablet Take 875 mg by mouth 2 (two) times daily.  benzonatate (TESSALON) 200 MG capsule Take 200 mg by mouth 3 (three) times daily.     bisoprolol-hydrochlorothiazide (ZIAC) 2.5-6.25 MG per tablet Take 1 tablet by mouth daily.     Chlorpheniramine Maleate (CHLORPHEN MALEATE PO) Take 4 mg by mouth daily as needed (allergies).     guaiFENesin-codeine (ROBITUSSIN AC) 100-10 MG/5ML syrup Take 10 mLs by mouth at bedtime as needed for cough.     hydrocortisone (ANUSOL-HC) 2.5 % rectal  cream Place 1 Application rectally 2 (two) times daily. As needed for rectal bleeding (Patient taking differently: Place 1 Application rectally 2 (two) times daily as needed (rectal bleeding).) 30 g 1   ketoconazole (NIZORAL) 2 % cream Apply 1 application topically daily as needed (foot rash).     levothyroxine (SYNTHROID) 50 MCG tablet Take 50 mcg by mouth daily before breakfast.  10   Multiple Vitamin (MULTIVITAMIN) capsule Take 1 capsule by mouth daily.     No current facility-administered medications for this visit.    PAST MEDICAL HISTORY: Past Medical History:  Diagnosis Date   Arthritis    Diverticulosis    Gout    Hemifacial spasm    left side affected takes Artane daily;    History of bronchitis 11/2013   Hypertension    takes Ziac daily   Joint pain     PAST SURGICAL HISTORY: Past Surgical History:  Procedure Laterality Date   CATARACT EXTRACTION, BILATERAL Bilateral 2015, 2016   COLONOSCOPY     COLONOSCOPY N/A 12/18/2017   diverticulosis and internal hemorhoids.   COLONOSCOPY WITH PROPOFOL N/A 08/07/2022   Procedure: COLONOSCOPY WITH PROPOFOL;  Surgeon: Eloise Harman, DO;  [facility];  Service: Endoscopy;  Laterality: N/A;  11:00 am   eye muschle surgeries  as a child   x 5   HERNIA REPAIR Left    as a child-inguinal;right inguinal in 1969   POLYPECTOMY  08/07/2022   Procedure: POLYPECTOMY;  Surgeon: Eloise Harman, DO;  [facility];  Service: Endoscopy;;   TOTAL HIP ARTHROPLASTY Right 03/24/2014   Procedure: TOTAL HIP ARTHROPLASTY ANTERIOR APPROACH;  Surgeon: Ninetta Lights, MD;  Location: [facility];  Service: Orthopedics;  Laterality: Right;   TOTAL HIP ARTHROPLASTY Left 08/26/2018   Procedure: LEFT TOTAL HIP ARTHROPLASTY ANTERIOR APPROACH;  Surgeon: Renette Butters, MD;  Location: [facility];  Service: Orthopedics;  Laterality: Left;    FAMILY HISTORY: Family History  Problem Relation Age of Onset   Colon cancer Neg Hx    Colon  polyps Neg Hx     SOCIAL HISTORY: Social History   Socioeconomic History   Marital status: Married    Spouse name: Not on file   Number of children: Not on file   Years of education: Not on file   Highest education level: Not on file  Occupational History   Not on file  Tobacco Use   Smoking status: Never   Smokeless tobacco: Never  Vaping Use   Vaping Use: Never used  Substance and Sexual Activity   Alcohol use: Yes    Comment: couple of drinks at supper   Drug use: No   Sexual activity: Yes  Other Topics Concern   Not on file  Social History Narrative   Not on file   Social Determinants of Health   Financial Resource Strain: Not on file  Food Insecurity: Not on file  Transportation Needs: Not on file  Physical Activity: Not on file  Stress: Not on file  Social Connections: Not on file  Intimate Partner Violence: Not on file      Marcial Pacas, M.D. Ph.D.  [facility] [address] [place], [place] [address] Ph: [phone] Fax: [phone]  CC:  Redmond School, MD [place],  [place] [address]  Redmond School, MD

## 2022-08-23 NOTE — Telephone Encounter (Signed)
Pt called and LMOVM needing an appt for hemorrhoids. Please call and schedule

## 2022-08-25 DIAGNOSIS — G5132 Clonic hemifacial spasm, left: Secondary | ICD-10-CM

## 2022-08-25 MED ORDER — ONABOTULINUMTOXINA 100 UNITS IJ SOLR
100.0000 [IU] | Freq: Once | INTRAMUSCULAR | Status: AC
  Administered 2022-08-25: 100 [IU] via INTRAMUSCULAR

## 2022-08-30 DIAGNOSIS — L570 Actinic keratosis: Secondary | ICD-10-CM | POA: Diagnosis not present

## 2022-08-30 DIAGNOSIS — X32XXXD Exposure to sunlight, subsequent encounter: Secondary | ICD-10-CM | POA: Diagnosis not present

## 2022-09-10 ENCOUNTER — Ambulatory Visit (INDEPENDENT_AMBULATORY_CARE_PROVIDER_SITE_OTHER): Payer: Medicare PPO | Admitting: Gastroenterology

## 2022-09-11 ENCOUNTER — Encounter: Payer: Self-pay | Admitting: Gastroenterology

## 2022-09-11 ENCOUNTER — Ambulatory Visit: Payer: Medicare PPO | Admitting: Gastroenterology

## 2022-09-11 VITALS — BP 146/83 | HR 47 | Temp 98.1°F | Ht 68.0 in | Wt 214.6 lb

## 2022-09-11 DIAGNOSIS — K648 Other hemorrhoids: Secondary | ICD-10-CM

## 2022-09-11 DIAGNOSIS — K649 Unspecified hemorrhoids: Secondary | ICD-10-CM | POA: Insufficient documentation

## 2022-09-11 HISTORY — DX: Unspecified hemorrhoids: K64.9

## 2022-09-11 MED ORDER — HYDROCORTISONE (PERIANAL) 2.5 % EX CREA: 1.0000 | TOPICAL_CREAM | Freq: Two times a day (BID) | CUTANEOUS | 1 refills | Status: DC

## 2022-09-11 NOTE — Patient Instructions (Signed)
Continue to avoid straining, limit toilet time to 2-3 minutes, and avoid constipation.  Please call if you would like to do a hemorrhoid banding in the future!  I will see you back as needed!  I enjoyed seeing you again today! As you know, I value our relationship and want to provide genuine, compassionate, and quality care. I welcome your feedback. If you receive a survey regarding your visit,  I greatly appreciate you taking time to fill this out. See you next time!  Annitta Needs, PhD, ANP-BC [facility]

## 2022-09-11 NOTE — Progress Notes (Signed)
Gastroenterology Office Note     Primary Care Physician:  Redmond School, MD  Primary Gastroenterologist: Dr. Abbey Chatters    Chief Complaint   Chief Complaint  Patient presents with   Follow-up    Follow up after colonoscopy: hemorrhoids     History of Present Illness   Erik Odonnell is a 76 y.o. male presenting today in follow-up with a history of rectal bleeding, undergoing colonoscopy in interim from last visit with hemorrhoids, tubular adenoma, and hyperplastic polyp (Oct 2023).  He has noted improvement with symptoms. Bleeding resolved. Used anusol cream with good results. Using Miralax each evening. No straining. No other concerns today. Recently returned from [place].     Past Medical History:  Diagnosis Date   Arthritis    Diverticulosis    Gout    Hemifacial spasm    left side affected takes Artane daily;    History of bronchitis 11/2013   Hypertension    takes Ziac daily   Joint pain     Past Surgical History:  Procedure Laterality Date   CATARACT EXTRACTION, BILATERAL Bilateral 2015, 2016   COLONOSCOPY     COLONOSCOPY N/A 12/18/2017   diverticulosis and internal hemorhoids.   COLONOSCOPY WITH PROPOFOL N/A 08/07/2022   Procedure: COLONOSCOPY WITH PROPOFOL;  Surgeon: Eloise Harman, DO;  [facility];  Service: Endoscopy;  Laterality: N/A;  11:00 am   eye muschle surgeries  as a child   x 5   HERNIA REPAIR Left    as a child-inguinal;right inguinal in 1969   POLYPECTOMY  08/07/2022   Procedure: POLYPECTOMY;  Surgeon: Eloise Harman, DO;  [facility];  Service: Endoscopy;;   TOTAL HIP ARTHROPLASTY Right 03/24/2014   Procedure: TOTAL HIP ARTHROPLASTY ANTERIOR APPROACH;  Surgeon: Ninetta Lights, MD;  Location: [facility];  Service: Orthopedics;  Laterality: Right;   TOTAL HIP ARTHROPLASTY Left 08/26/2018   Procedure: LEFT TOTAL HIP ARTHROPLASTY ANTERIOR APPROACH;  Surgeon: Renette Butters, MD;  [facility];  Service:  Orthopedics;  Laterality: Left;    Current Outpatient Medications  Medication Sig Dispense Refill   allopurinol (ZYLOPRIM) 300 MG tablet Take 300 mg by mouth daily.     ALPRAZolam (XANAX) 0.5 MG tablet Take 0.25-0.5 mg by mouth at bedtime as needed for sleep.     bisoprolol-hydrochlorothiazide (ZIAC) 2.5-6.25 MG per tablet Take 1 tablet by mouth daily.     Chlorpheniramine Maleate (CHLORPHEN MALEATE PO) Take 4 mg by mouth daily as needed (allergies).     hydrocortisone (ANUSOL-HC) 2.5 % rectal cream Place 1 Application rectally 2 (two) times daily. As needed for rectal bleeding (Patient taking differently: Place 1 Application rectally 2 (two) times daily as needed (rectal bleeding).) 30 g 1   ketoconazole (NIZORAL) 2 % cream Apply 1 application topically daily as needed (foot rash).     levothyroxine (SYNTHROID) 50 MCG tablet Take 50 mcg by mouth daily before breakfast.  10   Multiple Vitamin (MULTIVITAMIN) capsule Take 1 capsule by mouth daily.     Current Facility-Administered Medications  Medication Dose Route Frequency Provider Last Rate Last Admin   botulinum toxin Type A (BOTOX) injection 100 Units  100 Units Intramuscular Once Marcial Pacas, MD        Allergies as of 09/11/2022   (No Known Allergies)    Family History  Problem Relation Age of Onset   Colon cancer Neg Hx    Colon polyps Neg Hx  Social History   Socioeconomic History   Marital status: Married    Spouse name: Not on file   Number of children: Not on file   Years of education: Not on file   Highest education level: Not on file  Occupational History   Not on file  Tobacco Use   Smoking status: Never   Smokeless tobacco: Never  Vaping Use   Vaping Use: Never used  Substance and Sexual Activity   Alcohol use: Yes    Comment: couple of drinks at supper   Drug use: No   Sexual activity: Yes  Other Topics Concern   Not on file  Social History Narrative   Not on file   Social Determinants of Health    Financial Resource Strain: Not on file  Food Insecurity: Not on file  Transportation Needs: Not on file  Physical Activity: Not on file  Stress: Not on file  Social Connections: Not on file  Intimate Partner Violence: Not on file     Review of Systems   Gen: Denies any fever, chills, fatigue, weight loss, lack of appetite.  CV: Denies chest pain, heart palpitations, peripheral edema, syncope.  Resp: Denies shortness of breath at rest or with exertion. Denies wheezing or cough.  GI: see HPI GU : Denies urinary burning, urinary frequency, urinary hesitancy MS: Denies joint pain, muscle weakness, cramps, or limitation of movement.  Derm: Denies rash, itching, dry skin Psych: Denies depression, anxiety, memory loss, and confusion Heme: Denies bruising, bleeding, and enlarged lymph nodes.   Physical Exam   BP (!) 146/83   Pulse (!) 47   Temp 98.1 F (36.7 C)   Ht '5\' 8"'$  (1.727 m)   Wt 214 lb 9.6 oz (97.3 kg)   BMI 32.63 kg/m  General:   Alert and oriented. Pleasant and cooperative. Well-nourished and well-developed.  Head:  Normocephalic and atraumatic. Eyes:  Without icterus Abdomen:  +BS, soft, non-tender and non-distended. No HSM noted. No guarding or rebound. No masses appreciated.  Rectal:  Deferred  Msk:  Symmetrical without gross deformities. Normal posture. Extremities:  Without edema. Neurologic:  Alert and  oriented x4;  grossly normal neurologically. Skin:  Intact without significant lesions or rashes. Psych:  Alert and cooperative. Normal mood and affect.   Assessment   Erik Odonnell is a 76 y.o. male presenting today in follow-up with a history of rectal bleeding, undergoing colonoscopy in interim from last visit with hemorrhoids, tubular adenoma, and hyperplastic polyp (Oct 2023).  Symptomatic hemorrhoids improved. We discussed hemorrhoid banding in the future if he desires. He would like to hold off on this currently but will call if needed.     PLAN     Anusol prn. Refills provided Call if desires hemorrhoid banding Return prn    Annitta Needs, PhD, [facility]

## 2022-09-27 DIAGNOSIS — R221 Localized swelling, mass and lump, neck: Secondary | ICD-10-CM | POA: Diagnosis not present

## 2022-09-27 DIAGNOSIS — Z6833 Body mass index (BMI) 33.0-33.9, adult: Secondary | ICD-10-CM | POA: Diagnosis not present

## 2022-09-27 DIAGNOSIS — T7840XA Allergy, unspecified, initial encounter: Secondary | ICD-10-CM | POA: Diagnosis not present

## 2022-09-27 DIAGNOSIS — E6609 Other obesity due to excess calories: Secondary | ICD-10-CM | POA: Diagnosis not present

## 2022-10-08 ENCOUNTER — Telehealth: Payer: Self-pay | Admitting: Neurology

## 2022-10-08 NOTE — Telephone Encounter (Signed)
Please obtain authorization for EMG guided Xeomin 100 unit injection. Dx code: G21.32

## 2022-10-08 NOTE — Telephone Encounter (Signed)
Pt is scheduled for botox injection on 11/21/22 and will need a new PA before appointment. Current PA expires 10/22/23

## 2022-10-24 ENCOUNTER — Telehealth: Payer: Self-pay

## 2022-10-24 ENCOUNTER — Other Ambulatory Visit (HOSPITAL_COMMUNITY): Payer: Self-pay

## 2022-10-24 NOTE — Telephone Encounter (Signed)
Pharmacy Patient Advocate Encounter   Received notification from [facility] that prior authorization for Botox 100UNIT solution is required/requested.    PA submitted on 10/24/2022 to (ins) [employer] via CoverMyMeds Key [id number] Status is pending

## 2022-10-24 NOTE — Telephone Encounter (Signed)
error 

## 2022-10-25 ENCOUNTER — Other Ambulatory Visit (HOSPITAL_COMMUNITY): Payer: Self-pay

## 2022-10-25 ENCOUNTER — Telehealth: Payer: Self-pay

## 2022-10-25 NOTE — Telephone Encounter (Signed)
Benefit Verification [id number] Submitted!

## 2022-10-26 ENCOUNTER — Other Ambulatory Visit (HOSPITAL_COMMUNITY): Payer: Self-pay

## 2022-10-30 ENCOUNTER — Other Ambulatory Visit (HOSPITAL_COMMUNITY): Payer: Self-pay

## 2022-10-30 NOTE — Telephone Encounter (Signed)
Pharmacy Patient Advocate Encounter  Prior Authorization for Botox 100UNIT solution has been approved.    PA# PA Case ID: [id number] Effective dates: 04/25/2022 through 10/22/2023  This is [employer].

## 2022-10-31 NOTE — Telephone Encounter (Signed)
Patient is scheduled for EMG guided Botox injection on 11/21/22.

## 2022-10-31 NOTE — Telephone Encounter (Signed)
Patient scheduled for appointment on 11/21/2022.

## 2022-11-05 IMAGING — DX DG CHEST 2V
2 series · 2 of 2 positions shown · non-contrast
Comparison: X-ray chest 11/23/2013.

CLINICAL DATA: Chronic cough.

EXAM:
CHEST - 2 VIEW

[chest pa]
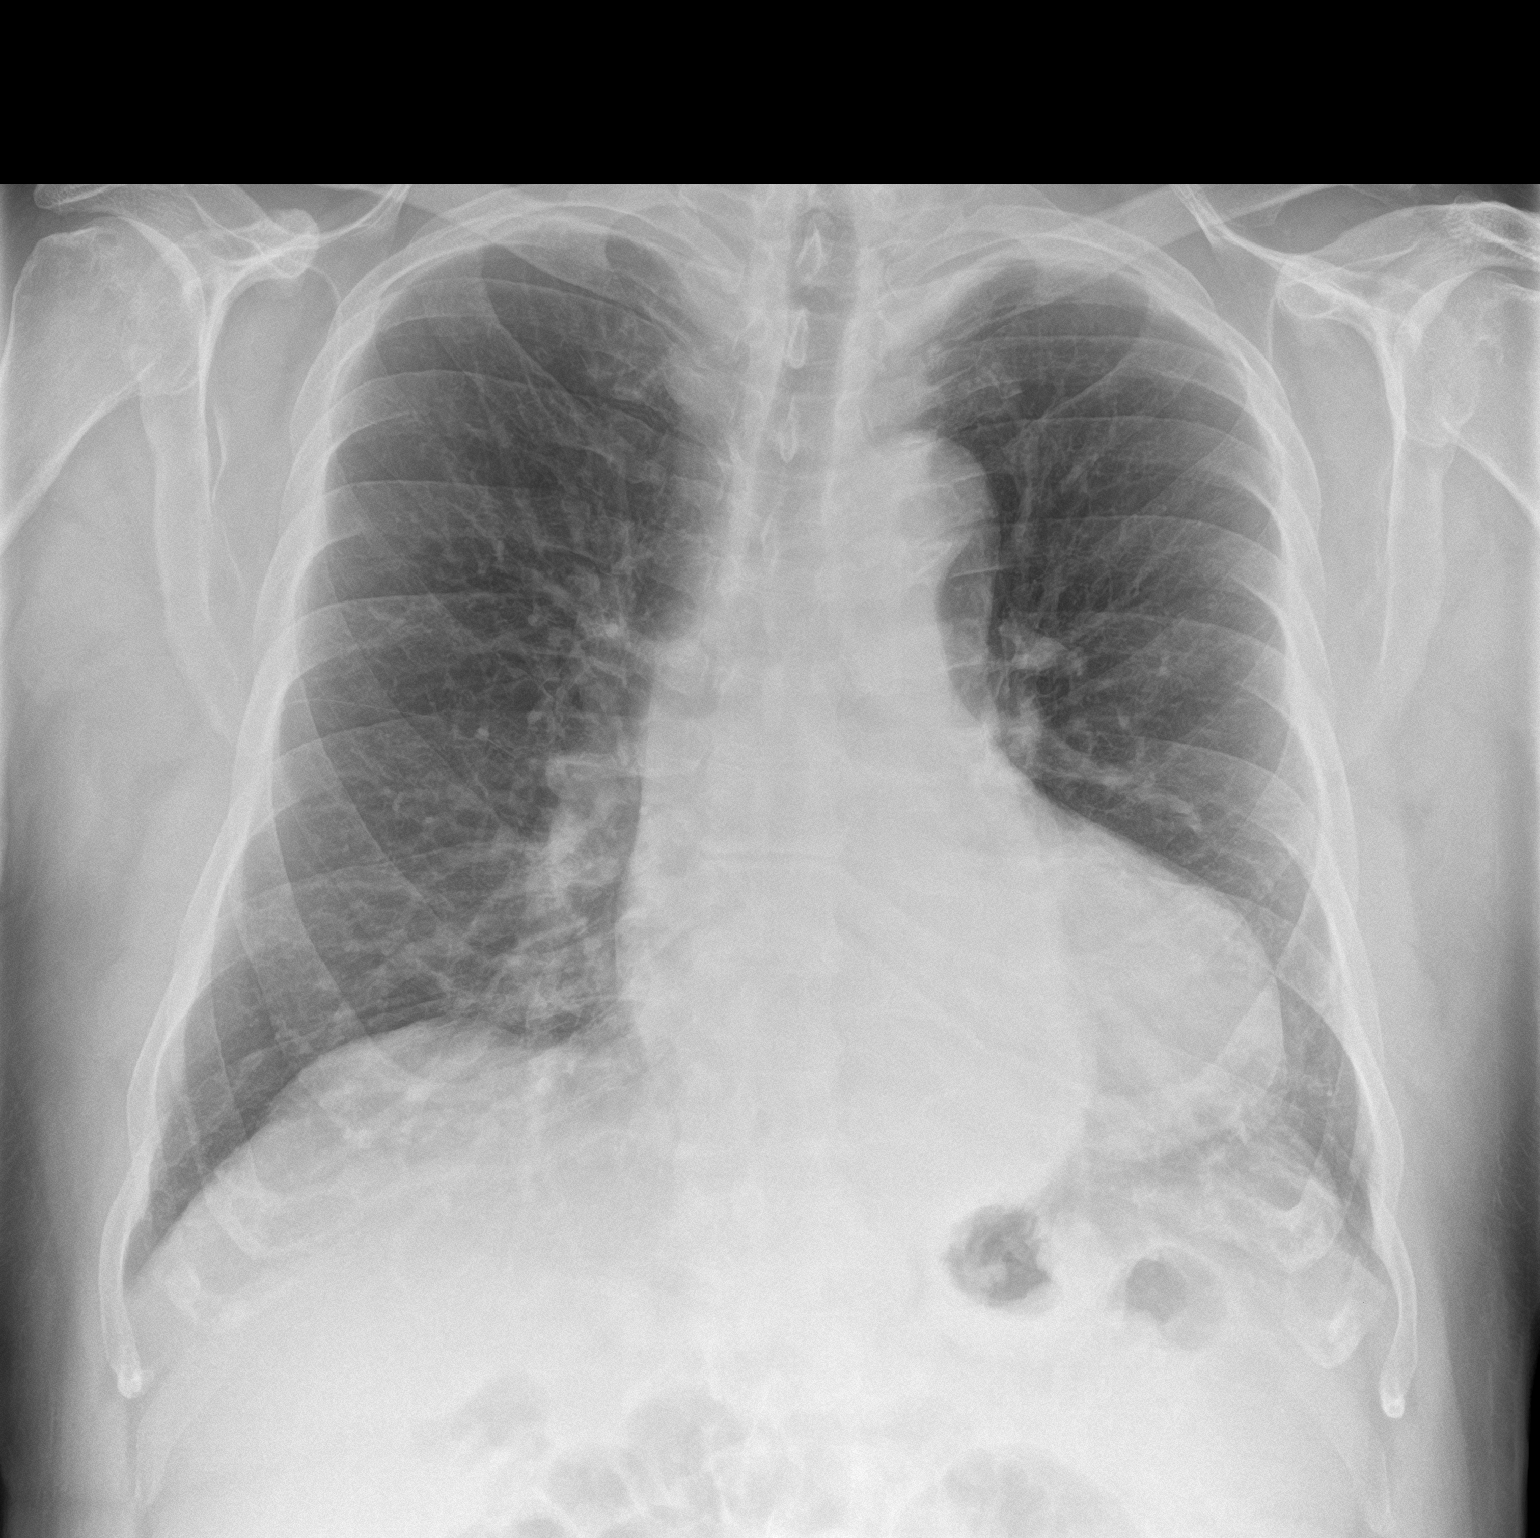

[chest lat]
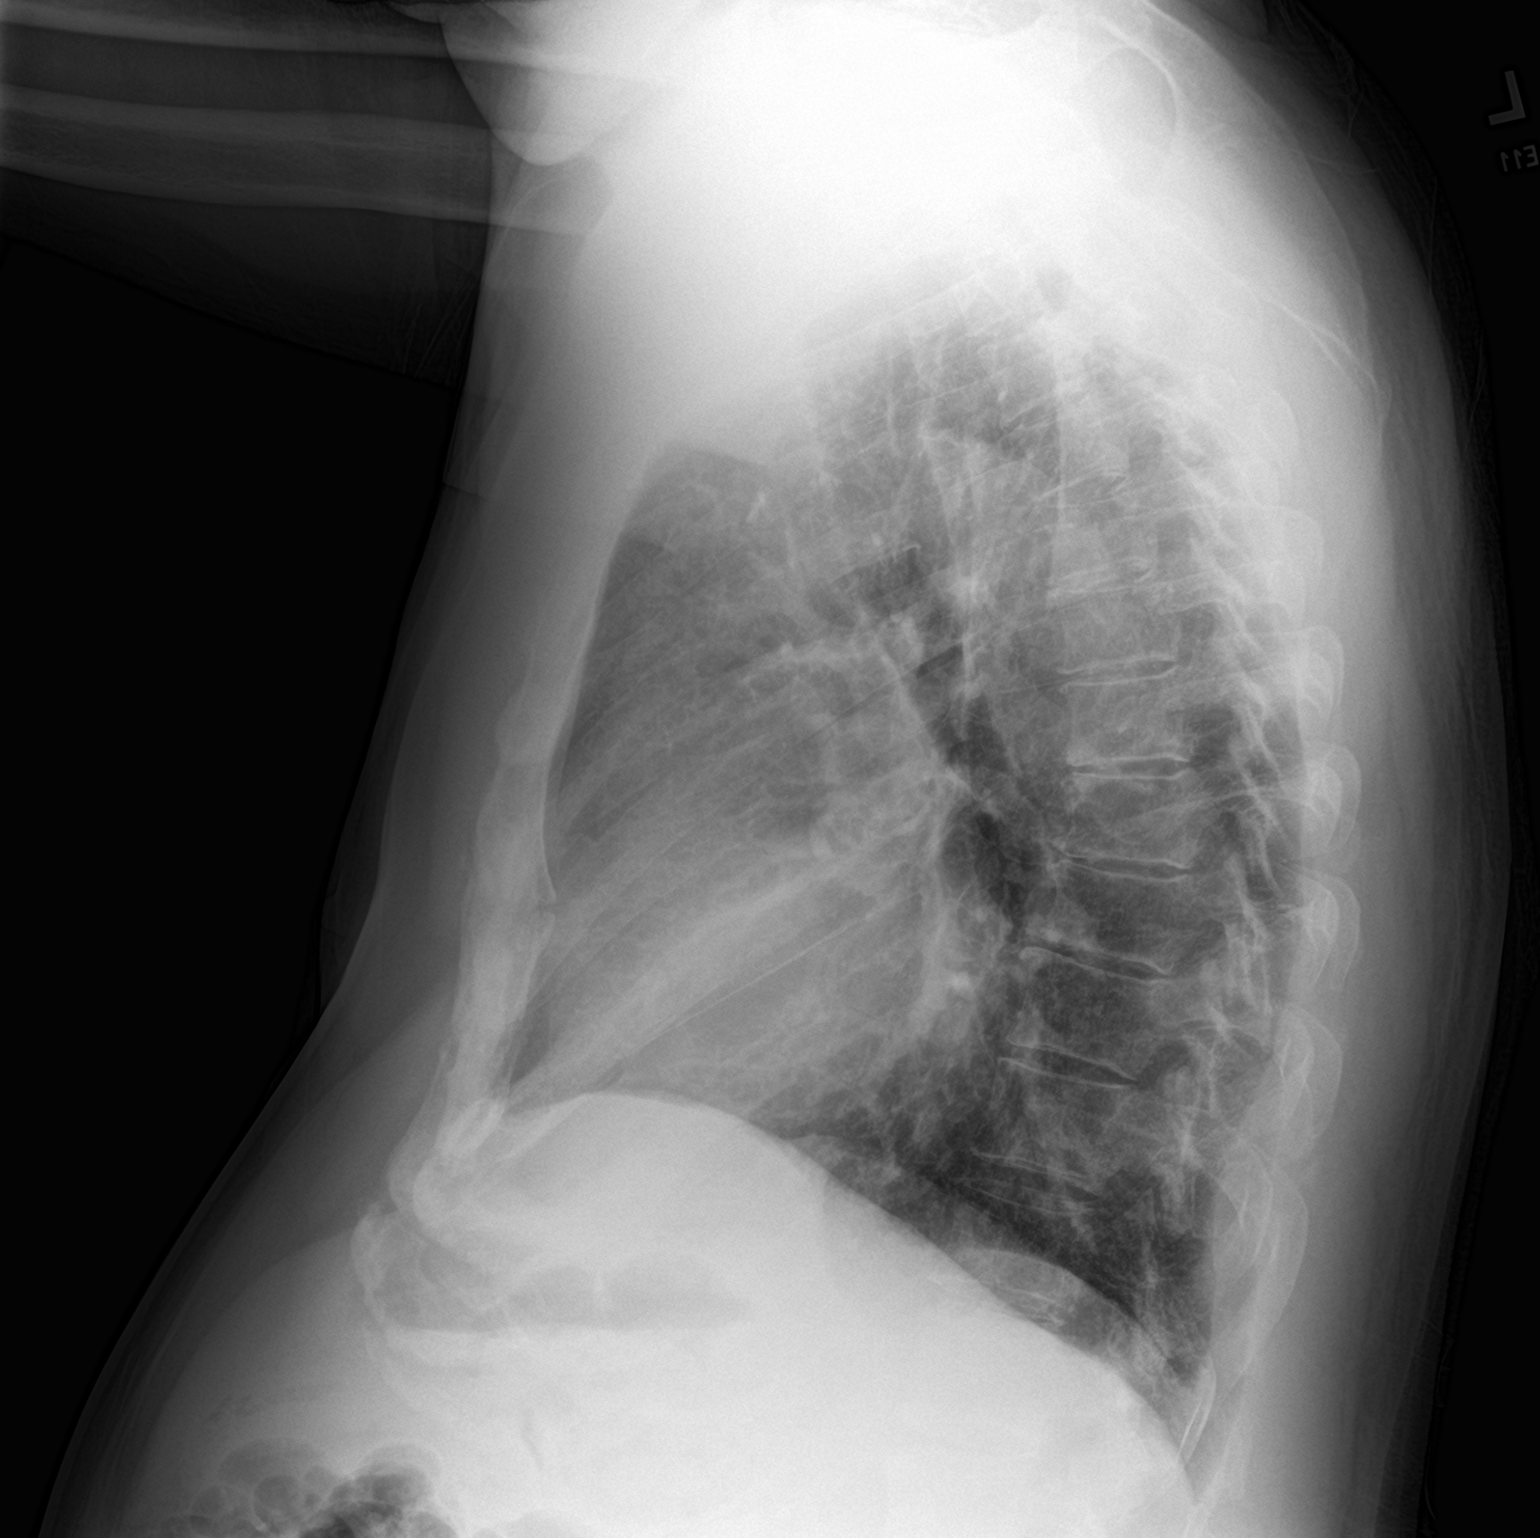

[2 of 2 positions shown; findings below may reference images not displayed]

FINDINGS: The cardiac silhouette is borderline enlarged. The lungs are mildly
hyperinflated with peribronchial thickening. No airspace
consolidation, edema, pleural effusion, or pneumothorax is
identified. No acute osseous abnormality is seen.
IMPRESSION: Mild bronchitic changes.

## 2022-11-08 DIAGNOSIS — Z961 Presence of intraocular lens: Secondary | ICD-10-CM | POA: Diagnosis not present

## 2022-11-08 DIAGNOSIS — H52203 Unspecified astigmatism, bilateral: Secondary | ICD-10-CM | POA: Diagnosis not present

## 2022-11-08 DIAGNOSIS — H524 Presbyopia: Secondary | ICD-10-CM | POA: Diagnosis not present

## 2022-11-15 ENCOUNTER — Ambulatory Visit: Payer: Medicare PPO | Admitting: Neurology

## 2022-11-19 DIAGNOSIS — M25551 Pain in right hip: Secondary | ICD-10-CM | POA: Diagnosis not present

## 2022-11-21 ENCOUNTER — Ambulatory Visit: Payer: Medicare PPO | Admitting: Neurology

## 2022-11-21 ENCOUNTER — Encounter: Payer: Self-pay | Admitting: Neurology

## 2022-11-21 VITALS — BP 149/92 | HR 88 | Ht 68.0 in | Wt 214.0 lb

## 2022-11-21 DIAGNOSIS — G5132 Clonic hemifacial spasm, left: Secondary | ICD-10-CM | POA: Diagnosis not present

## 2022-11-21 MED ORDER — ONABOTULINUMTOXINA 100 UNITS IJ SOLR
100.0000 [IU] | Freq: Once | INTRAMUSCULAR | Status: AC
  Administered 2022-11-21: 100 [IU] via INTRAMUSCULAR

## 2022-11-21 NOTE — Progress Notes (Signed)
Botox 100 units x 1 vial [id number] Lot-03/2025 Exp-03/2025 B/B

## 2022-11-21 NOTE — Progress Notes (Signed)
Chief Complaint  Patient presents with   Procedure    Botox 100, doing well with injection       ASSESSMENT AND PLAN  Erik Odonnell is a 77 y.o. male  Left hemifacial spasm,  Had previous botulism toxin injection by Dr. Merlene Laughter before, did report improvement with the injection  Long history of strabismus, multiple surgery in the past, still only use 1 eye as dominant eye, apparent bilateral esophoria, left worse than right,  EMG guided xeomin injection, 100 units of BOTOX was dissolved into 2 cc of normal saline,    2.5 units at each injection site x12=30 units, discard 70 units   DIAGNOSTIC DATA (LABS, IMAGING, TESTING) - I reviewed patient records, labs, notes, testing and imaging myself where available.   MEDICAL HISTORY:  Erik Odonnell is a 77 year old male, patient of Dr. April Manson, for evaluation EMG guided botulism toxin injection for left hemifacial spasm,  I reviewed and summarized the referring note.  Past medical history Hypertension Hypothyroidism on supplement  He was born " crossed eyed", had multiple eye surgery, still has difficulty fused imaging from 2 eyes, he tends to use 1 eye as dominant eye, either left, or right, around 1990, he noticed gradual onset left eye muscle twitching, was diagnosed with left hemifacial spasm, had a few sessions of botulism toxin injection few years back by a local neurologist Dr.[place], did help his left facial spasm  But he has stopped going to his office, now he has worsening left eye twitching, to the point of covering his left vision majority of the time,   Laboratory evaluations,  UPDATE Aug 23 2022: Injection does help him opening up left eye, but the benefit was short lasting  UPDATE Nov 21 2022: Injection only helped his left eye open temporarily, today he reviewed, he has worse vision at the left eye 20/200, actually performed to close his left eye, I have suggested blepharoplasty of left upper eyelid, he  decided not to proceed,  Reported around 1999, he began to have frequent left eye blinking, to the point of blocking his left eye, which was his dominant eye at that time, he suffered lifelong history of strabismus, had multiple extraocular muscle surgery, reported over the years, his dominant eye switch from left to right,  Since he developed frequent the left orbicularis oculi muscle twitching, to the point of blocking his left vision, he now uses right eye as dominant, over the years, he no longer have significant PERI orbicularis oculi muscle twitching, left eye tends to stay shot  PHYSICAL EXAM:    Vitals:   11/21/22 1505  Weight: 214 lb (97.1 kg)  Height: '5\' 8"'$  (1.727 m)     NEUROLOGICAL EXAM:  Only mild visible lower left orbicularis oculi muscle twitching, left eye tends to stay close, mild muscle twitching extending  to left cheek muscle, left frontalis, apparent left esotropia, lesser degree of right esotropia, OD 20/30, Os 20/200,     REVIEW OF SYSTEMS:  Full 14 system review of systems performed and notable only for as above All other review of systems were negative.   ALLERGIES: No Known Allergies  HOME MEDICATIONS: Current Outpatient Medications  Medication Sig Dispense Refill   allopurinol (ZYLOPRIM) 300 MG tablet Take 300 mg by mouth daily.     ALPRAZolam (XANAX) 0.5 MG tablet Take 0.25-0.5 mg by mouth at bedtime as needed for sleep.     bisoprolol-hydrochlorothiazide (ZIAC) 2.5-6.25 MG per tablet Take 1 tablet  by mouth daily.     Chlorpheniramine Maleate (CHLORPHEN MALEATE PO) Take 4 mg by mouth daily as needed (allergies).     hydrocortisone (ANUSOL-HC) 2.5 % rectal cream Place 1 Application rectally 2 (two) times daily. As needed for rectal bleeding 30 g 1   ketoconazole (NIZORAL) 2 % cream Apply 1 application topically daily as needed (foot rash).     levothyroxine (SYNTHROID) 50 MCG tablet Take 50 mcg by mouth daily before breakfast.  10   Multiple Vitamin  (MULTIVITAMIN) capsule Take 1 capsule by mouth daily.     Current Facility-Administered Medications  Medication Dose Route Frequency Provider Last Rate Last Admin   botulinum toxin Type A (BOTOX) injection 100 Units  100 Units Intramuscular Once Marcial Pacas, MD       botulinum toxin Type A (BOTOX) injection 100 Units  100 Units Intramuscular Once Marcial Pacas, MD        PAST MEDICAL HISTORY: Past Medical History:  Diagnosis Date   Arthritis    Diverticulosis    Gout    Hemifacial spasm    left side affected takes Artane daily;    History of bronchitis 11/2013   Hypertension    takes Ziac daily   Joint pain     PAST SURGICAL HISTORY: Past Surgical History:  Procedure Laterality Date   CATARACT EXTRACTION, BILATERAL Bilateral 2015, 2016   COLONOSCOPY     COLONOSCOPY N/A 12/18/2017   diverticulosis and internal hemorhoids.   COLONOSCOPY WITH PROPOFOL N/A 08/07/2022   Procedure: COLONOSCOPY WITH PROPOFOL;  Surgeon: Eloise Harman, DO;  [facility];  Service: Endoscopy;  Laterality: N/A;  11:00 am   eye muschle surgeries  as a child   x 5   HERNIA REPAIR Left    as a child-inguinal;right inguinal in 1969   POLYPECTOMY  08/07/2022   Procedure: POLYPECTOMY;  Surgeon: Eloise Harman, DO;  [facility];  Service: Endoscopy;;   TOTAL HIP ARTHROPLASTY Right 03/24/2014   Procedure: TOTAL HIP ARTHROPLASTY ANTERIOR APPROACH;  Surgeon: Ninetta Lights, MD;  Location: [facility];  Service: Orthopedics;  Laterality: Right;   TOTAL HIP ARTHROPLASTY Left 08/26/2018   Procedure: LEFT TOTAL HIP ARTHROPLASTY ANTERIOR APPROACH;  Surgeon: Renette Butters, MD;  [facility];  Service: Orthopedics;  Laterality: Left;    FAMILY HISTORY: Family History  Problem Relation Age of Onset   Colon cancer Neg Hx    Colon polyps Neg Hx     SOCIAL HISTORY: Social History   Socioeconomic History   Marital status: Married    Spouse name: Not on file   Number of children:  Not on file   Years of education: Not on file   Highest education level: Not on file  Occupational History   Not on file  Tobacco Use   Smoking status: Never   Smokeless tobacco: Never  Vaping Use   Vaping Use: Never used  Substance and Sexual Activity   Alcohol use: Yes    Comment: couple of drinks at supper   Drug use: No   Sexual activity: Yes  Other Topics Concern   Not on file  Social History Narrative   Not on file   Social Determinants of Health   Financial Resource Strain: Not on file  Food Insecurity: Not on file  Transportation Needs: Not on file  Physical Activity: Not on file  Stress: Not on file  Social Connections: Not on file  Intimate Partner Violence: Not on file  Marcial Pacas, M.D. Ph.D.  [facility] [address] [place], [place] [address] Ph: [phone] Fax: [phone]  CC:  Redmond School, MD [place],  [place] [address]  Redmond School, MD

## 2022-12-11 ENCOUNTER — Telehealth: Payer: Self-pay | Admitting: Gastroenterology

## 2022-12-11 NOTE — Telephone Encounter (Signed)
Looks like the appt for 2/21 is an old appt. Patient does not need to come unless having issues. Please let him know. Thanks!

## 2022-12-12 ENCOUNTER — Ambulatory Visit: Payer: Medicare PPO | Admitting: Gastroenterology

## 2022-12-13 ENCOUNTER — Ambulatory Visit (HOSPITAL_COMMUNITY): Payer: Medicare PPO | Attending: Internal Medicine | Admitting: Physical Therapy

## 2022-12-13 ENCOUNTER — Other Ambulatory Visit: Payer: Self-pay

## 2022-12-13 DIAGNOSIS — M25551 Pain in right hip: Secondary | ICD-10-CM

## 2022-12-13 DIAGNOSIS — M6281 Muscle weakness (generalized): Secondary | ICD-10-CM | POA: Diagnosis not present

## 2022-12-13 DIAGNOSIS — Z9181 History of falling: Secondary | ICD-10-CM | POA: Insufficient documentation

## 2022-12-13 NOTE — Therapy (Signed)
OUTPATIENT PHYSICAL THERAPY LOWER EXTREMITY EVALUATION   Patient Name: Erik Odonnell MRN: [id number] DOB:05-30-1946, 77 y.o., male Today's Date: 12/13/2022  END OF SESSION:  PT End of Session - 12/13/22 1059     Visit Number 1    Number of Visits 8    Date for PT Re-Evaluation 01/20/23   Pt will be unavailable several dates in March   [place]; authorization placed    Progress Note Due on Visit 8    PT Start Time 0950    PT Stop Time 1035    PT Time Calculation (min) 45 min    Behavior During Therapy [facility] for tasks assessed/performed             Past Medical History:  Diagnosis Date   Arthritis    Diverticulosis    Gout    Hemifacial spasm    left side affected takes Artane daily;    History of bronchitis 11/2013   Hypertension    takes Ziac daily   Joint pain    Past Surgical History:  Procedure Laterality Date   CATARACT EXTRACTION, BILATERAL Bilateral 2015, 2016   COLONOSCOPY     COLONOSCOPY N/A 12/18/2017   diverticulosis and internal hemorhoids.   COLONOSCOPY WITH PROPOFOL N/A 08/07/2022   Procedure: COLONOSCOPY WITH PROPOFOL;  Surgeon: Eloise Harman, DO;  [facility];  Service: Endoscopy;  Laterality: N/A;  11:00 am   eye muschle surgeries  as a child   x 5   HERNIA REPAIR Left    as a child-inguinal;right inguinal in 1969   POLYPECTOMY  08/07/2022   Procedure: POLYPECTOMY;  Surgeon: Eloise Harman, DO;  [facility];  Service: Endoscopy;;   TOTAL HIP ARTHROPLASTY Right 03/24/2014   Procedure: TOTAL HIP ARTHROPLASTY ANTERIOR APPROACH;  Surgeon: Ninetta Lights, MD;  Location: [facility];  Service: Orthopedics;  Laterality: Right;   TOTAL HIP ARTHROPLASTY Left 08/26/2018   Procedure: LEFT TOTAL HIP ARTHROPLASTY ANTERIOR APPROACH;  Surgeon: Renette Butters, MD;  [facility];  Service: Orthopedics;  Laterality: Left;   Patient Active Problem List   Diagnosis Date Noted   Hemorrhoids 09/11/2022   Rectal  bleeding 07/11/2022   Hemifacial spasm of left side of face 05/18/2022   Primary osteoarthritis of hip 08/26/2018   Primary osteoarthritis of left hip 08/04/2018   Special screening for malignant neoplasms, colon 09/11/2017   Proximal leg weakness 04/07/2014   Difficulty in walking(719.7) 04/07/2014   Impairment of balance 04/07/2014   Primary localized osteoarthrosis, pelvic region and thigh 03/24/2014    PCP: Redmond School  REFERRING PROVIDER: Renette Butters, MD  REFERRING DIAG:  M70.61 (ICD-10-CM) - Greater trochanteric bursitis, right    THERAPY DIAG:  Rt hip pain Muscle weakness  Rationale for Evaluation and Treatment: Rehabilitation  ONSET DATE: 10/10/2022     SUBJECTIVE STATEMENT: PT states that he noticed about two months ago after he would do heavy activity he would have increased pain in his Rt hip.  He noticed once he gets out of bed he is limping until he walks it out.  He can sit as long as he wants.  The pt states that he has fallen a couple of times.  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  PT states that he walked down a ravine in the back of his house and could not get back up the ravine.  He stated he made himself a cane from a branch that had fallen down; he would dig the branch in and take two steps forward, then repeat until he got up the ravine.   PERTINENT HISTORY: Remote B THR with anterior approach  PAIN:  Are you having pain? No pain now; worst pain in the past week no pain but the week before he was hopping along.  PRECAUTIONS: None  WEIGHT BEARING RESTRICTIONS: No  FALLS:  Has patient fallen in last 6 months? No  LIVING ENVIRONMENT: Lives with: lives with their family Lives in: House/apartment Stairs: Yes: External: 6 steps; on right going up:  able to go reciprocal; increased pain if he was hurting.  Has following equipment at home:  has walker/cane at home from [facility] but no  longer uses   OCCUPATION: retired  PLOF: Independent  PATIENT GOALS: less hip pain   NEXT MD VISIT: not sure   OBJECTIVE:   DIAGNOSTIC FINDINGS: Wellsite geologist took x ray and states that his THR was in great position.  PATIENT SURVEYS:  FOTO 61  COGNITION: Overall cognitive status: Within functional limits for tasks assessed       MUSCLE LENGTH: Hamstrings: Right 125 deg; Left 135 deg   POSTURE: rounded shoulders, forward head, decreased lumbar lordosis, and increased thoracic kyphosis  PALPATION: No sore spots    LOWER EXTREMITY MMT:  MMT Right eval Left eval  Hip flexion 3/5 4/5  Hip extension 3+/5 4/5  Hip abduction 3+/5  5/5  Hip adduction    Hip internal rotation    Hip external rotation    Knee flexion 5 5  Knee extension 5 5  Ankle dorsiflexion 5 5  Ankle plantarflexion    Ankle inversion    Ankle eversion     (Blank rows = not tested)   FUNCTIONAL TESTS:  30 seconds chair stand test :  13 average for age and sex  2 minute walk test: 400 ft with decrease ankle motion B noted leaning to LT  Single leg stance: RT: 0   ; LT   1    TODAY'S TREATMENT:                                                                                                                              DATE: 12/13/22 Eval    PATIENT EDUCATION:  Education details: HEP Person educated: Patient Education method: Theatre stage manager Education comprehension: returned demonstration  HOME EXERCISE PROGRAM: Access Code: [email or web address] URL: [email or web address] Date: 12/13/2022 Prepared by: Rayetta Humphrey  Exercises - Supine Bridge  - 1 x daily - 7 x weekly - 1 sets - 10 reps - 3-5" hold - Supine Active Straight Leg Raise  - 1 x daily - 7 x weekly - 1 sets - 10 reps - 3-5 hold - Sidelying Hip Abduction  - 1 x daily - 7 x weekly - 1 sets - 10 reps - 3-5 hold - Supine Hamstring  Stretch  - 1 x daily - 7 x weekly - 1 sets - 10 reps - 3-5 hold - Supine Single Knee  to Chest Stretch  - 1 x daily - 7 x weekly - 1 sets - 10 reps - 3-5 hold  ASSESSMENT:  CLINICAL IMPRESSION: Patient is a 77 y.o. male who was seen today for physical therapy evaluation and treatment for evaluation and treatment of Rt greater trochanter bursitis.  Evaluation demonstrates history of falling, decreased activity tolerance and decreased strength.  Mr. Lengle will benefit from skilled PT to address these issues and maximize his functional ability.   OBJECTIVE IMPAIRMENTS: decreased activity tolerance, decreased balance, difficulty walking, decreased strength, and pain.   ACTIVITY LIMITATIONS: standing and locomotion level  PARTICIPATION LIMITATIONS: shopping and community activity   REHAB POTENTIAL: Good  CLINICAL DECISION MAKING: Stable/uncomplicated  EVALUATION COMPLEXITY: Low   GOALS: Goals reviewed with patient? No  SHORT TERM GOALS: Target date: 12/27/2022 PT to be I in HEP to have no pain after physical activity  Baseline: Goal status: INITIAL  2.  Pt Rt LE muscle strength to increase 1/2 grade to be able to ambulate with equal wt bearing on LE  Baseline:  Goal status: INITIAL   LONG TERM GOALS: Target date: 01/10/2023  PT to be I in an advanced HEP to improve hamstring flexiblity B by 10 degrees. Baseline:  Goal status: INITIAL  2.   Pt Rt LE muscle strength to increase 1 grade to be able to be able to go up and down 12 steps in a reciprocal manner without difficulty Baseline:  Goal status: INITIAL  3.  PT to be able to single leg stance on both LE for 5-10" to decrease falls  Baseline:  Goal status: INITIAL   PLAN:  PT FREQUENCY: 2x/week  PT DURATION: 6 weeks  PLANNED INTERVENTIONS: Therapeutic exercises, Therapeutic activity, Balance training, Gait training, Patient/Family education, Self Care, and Manual therapy  PLAN FOR NEXT SESSION: Continue with stretching and strengthening to decrease pain and improve mobility.   Rayetta Humphrey, [place]  CLT [phone]

## 2022-12-18 ENCOUNTER — Telehealth (HOSPITAL_COMMUNITY): Payer: Self-pay

## 2022-12-18 ENCOUNTER — Encounter (HOSPITAL_COMMUNITY): Payer: Medicare PPO | Admitting: Physical Therapy

## 2022-12-18 ENCOUNTER — Encounter (HOSPITAL_COMMUNITY): Payer: Medicare PPO

## 2022-12-18 NOTE — Telephone Encounter (Signed)
Spoke with patient's wife on the phone; patient simply forgot his appointment today; he went to the [facility] and forgot his phone.  Wife states she will remind him of his appointment on Thursday.  10:58 AM, 12/18/22 Camar Guyton Small Saina Waage MPT [facility] [place] [phone]

## 2022-12-20 ENCOUNTER — Encounter (HOSPITAL_COMMUNITY): Payer: Self-pay | Admitting: Physical Therapy

## 2022-12-20 ENCOUNTER — Ambulatory Visit (HOSPITAL_COMMUNITY): Payer: Medicare PPO | Admitting: Physical Therapy

## 2022-12-20 DIAGNOSIS — M6281 Muscle weakness (generalized): Secondary | ICD-10-CM | POA: Diagnosis not present

## 2022-12-20 DIAGNOSIS — Z9181 History of falling: Secondary | ICD-10-CM

## 2022-12-20 DIAGNOSIS — M25551 Pain in right hip: Secondary | ICD-10-CM

## 2022-12-20 NOTE — Therapy (Signed)
OUTPATIENT PHYSICAL THERAPY LOWER EXTREMITY EVALUATION   Patient Name: Erik Odonnell MRN: [id number] DOB:[birth year], 77 y.o., male Today's Date: 12/20/2022  END OF SESSION:  PT End of Session - 12/20/22 0819     Visit Number 2    Number of Visits 8    Date for PT Re-Evaluation 01/20/23   Pt will be unavailable several dates in March   Authorization Type humana    Authorization Time Period 12 visits approved and 1 re eval from 12/13/22 - 01/20/23    Authorization - Visit Number 2    Authorization - Number of Visits 12    Progress Note Due on Visit 8    PT Start Time 0819    PT Stop Time 0858    PT Time Calculation (min) 39 min    Activity Tolerance Patient tolerated treatment well    Behavior During Therapy [facility] for tasks assessed/performed             Past Medical History:  Diagnosis Date   Arthritis    Diverticulosis    Gout    Hemifacial spasm    left side affected takes Artane daily;    History of bronchitis 11/2013   Hypertension    takes Ziac daily   Joint pain    Past Surgical History:  Procedure Laterality Date   CATARACT EXTRACTION, BILATERAL Bilateral 2015, 2016   COLONOSCOPY     COLONOSCOPY N/A 12/18/2017   diverticulosis and internal hemorhoids.   COLONOSCOPY WITH PROPOFOL N/A 08/07/2022   Procedure: COLONOSCOPY WITH PROPOFOL;  Surgeon: Eloise Harman, DO;  [facility];  Service: Endoscopy;  Laterality: N/A;  11:00 am   eye muschle surgeries  as a child   x 5   HERNIA REPAIR Left    as a child-inguinal;right inguinal in 1969   POLYPECTOMY  08/07/2022   Procedure: POLYPECTOMY;  Surgeon: Eloise Harman, DO;  [facility];  Service: Endoscopy;;   TOTAL HIP ARTHROPLASTY Right 03/24/2014   Procedure: TOTAL HIP ARTHROPLASTY ANTERIOR APPROACH;  Surgeon: Ninetta Lights, MD;  Location: [facility];  Service: Orthopedics;  Laterality: Right;   TOTAL HIP ARTHROPLASTY Left 08/26/2018   Procedure: LEFT TOTAL HIP ARTHROPLASTY ANTERIOR  APPROACH;  Surgeon: Renette Butters, MD;  [facility];  Service: Orthopedics;  Laterality: Left;   Patient Active Problem List   Diagnosis Date Noted   Hemorrhoids 09/11/2022   Rectal bleeding 07/11/2022   Hemifacial spasm of left side of face 05/18/2022   Primary osteoarthritis of hip 08/26/2018   Primary osteoarthritis of left hip 08/04/2018   Special screening for malignant neoplasms, colon 09/11/2017   Proximal leg weakness 04/07/2014   Difficulty in walking(719.7) 04/07/2014   Impairment of balance 04/07/2014   Primary localized osteoarthrosis, pelvic region and thigh 03/24/2014    PCP: Redmond School  REFERRING PROVIDER: Renette Butters, MD  REFERRING DIAG:  M70.61 (ICD-10-CM) - Greater trochanteric bursitis, right    THERAPY DIAG:  Rt hip pain Muscle weakness  Rationale for Evaluation and Treatment: Rehabilitation  ONSET DATE: 10/10/2022     SUBJECTIVE STATEMENT: PT states been doing his exercises. A little sore for a day or 2. Walked over from [employer].   PERTINENT HISTORY: Remote B THR with anterior approach  PAIN:  Are you having pain? No pain now; worst pain in the past week no pain but the week before he was hopping along.  PRECAUTIONS: None  WEIGHT BEARING RESTRICTIONS: No  FALLS:  Has patient fallen in last 6 months? No  LIVING ENVIRONMENT: Lives with: lives with their family Lives in: House/apartment Stairs: Yes: External: 6 steps; on right going up:  able to go reciprocal; increased pain if he was hurting.  Has following equipment at home:  has walker/cane at home from [facility] but no longer uses   OCCUPATION: retired  PLOF: Independent  PATIENT GOALS: less hip pain   NEXT MD VISIT: not sure   OBJECTIVE:   DIAGNOSTIC FINDINGS: Wellsite geologist took x ray and states that his THR was in great position.  PATIENT SURVEYS:  FOTO 61  COGNITION: Overall cognitive status: Within functional limits for tasks assessed       MUSCLE  LENGTH: Hamstrings: Right 125 deg; Left 135 deg   POSTURE: rounded shoulders, forward head, decreased lumbar lordosis, and increased thoracic kyphosis  PALPATION: No sore spots    LOWER EXTREMITY MMT:  MMT Right eval Left eval  Hip flexion 3/5 4/5  Hip extension 3+/5 4/5  Hip abduction 3+/5  5/5  Hip adduction    Hip internal rotation    Hip external rotation    Knee flexion 5 5  Knee extension 5 5  Ankle dorsiflexion 5 5  Ankle plantarflexion    Ankle inversion    Ankle eversion     (Blank rows = not tested)   FUNCTIONAL TESTS:  30 seconds chair stand test :  13 average for age and sex  2 minute walk test: 400 ft with decrease ankle motion B noted leaning to LT  Single leg stance: RT: 0   ; LT   1    TODAY'S TREATMENT:                                                                                                                              DATE:  12/20/22 Bridge 1 x 10  SLR 1 x 10 Supine active hamstring stretch 10 x 5 second holds Supine SKTC 10 x 5 second holds Sidelying hip abduction 2 x 10 bilateral  DKTC with heels on green ball 2 x 10 with 5 second holds Standing hip abduction RTB 2 x 10 bilateral    12/13/22 Eval    PATIENT EDUCATION:  Education details: HEP Person educated: Patient Education method: Theatre stage manager Education comprehension: returned demonstration  HOME EXERCISE PROGRAM: Access Code: [email or web address] URL: [email or web address] 12/[email or web address]/22 - Standing Hip Abduction with Counter Support  - 1 x daily - 7 x weekly - 2 sets - 10 reps  Date: 12/13/2022 - Supine Bridge  - 1 x daily - 7 x weekly - 1 sets - 10 reps - 3-5" hold - Supine Active Straight Leg Raise  - 1 x daily - 7 x weekly - 1 sets - 10 reps - 3-5 hold - Sidelying Hip Abduction  - 1 x daily - 7 x weekly - 1 sets - 10 reps - 3-5 hold - Supine Hamstring Stretch  -  1 x daily - 7 x weekly - 1 sets - 10 reps - 3-5 hold - Supine Single Knee to Chest Stretch  - 1 x  daily - 7 x weekly - 1 sets - 10 reps - 3-5 hold  ASSESSMENT:  CLINICAL IMPRESSION: Patient given intermittent cueing for mechanics of previously completed exercises. Additional exercises added for lumbar mobility, core and glute strength which are tolerated well. Cueing required for rest breaks as patient tends to keep performing exercises. Patient will continue to benefit from physical therapy in order to improve function and reduce impairment.   OBJECTIVE IMPAIRMENTS: decreased activity tolerance, decreased balance, difficulty walking, decreased strength, and pain.   ACTIVITY LIMITATIONS: standing and locomotion level  PARTICIPATION LIMITATIONS: shopping and community activity   REHAB POTENTIAL: Good  CLINICAL DECISION MAKING: Stable/uncomplicated  EVALUATION COMPLEXITY: Low   GOALS: Goals reviewed with patient? No  SHORT TERM GOALS: Target date: 12/27/2022 PT to be I in HEP to have no pain after physical activity  Baseline: Goal status: INITIAL  2.  Pt Rt LE muscle strength to increase 1/2 grade to be able to ambulate with equal wt bearing on LE  Baseline:  Goal status: INITIAL   LONG TERM GOALS: Target date: 01/10/2023  PT to be I in an advanced HEP to improve hamstring flexiblity B by 10 degrees. Baseline:  Goal status: INITIAL  2.   Pt Rt LE muscle strength to increase 1 grade to be able to be able to go up and down 12 steps in a reciprocal manner without difficulty Baseline:  Goal status: INITIAL  3.  PT to be able to single leg stance on both LE for 5-10" to decrease falls  Baseline:  Goal status: INITIAL   PLAN:  PT FREQUENCY: 2x/week  PT DURATION: 6 weeks  PLANNED INTERVENTIONS: Therapeutic exercises, Therapeutic activity, Balance training, Gait training, Patient/Family education, Self Care, and Manual therapy  PLAN FOR NEXT SESSION: Continue with stretching and strengthening to decrease pain and improve mobility.   8:20 AM, 12/20/22 Mearl Latin PT, DPT Physical Therapist at [facility]

## 2022-12-26 ENCOUNTER — Ambulatory Visit (HOSPITAL_COMMUNITY): Payer: Medicare PPO | Attending: Internal Medicine | Admitting: Physical Therapy

## 2022-12-26 DIAGNOSIS — M6281 Muscle weakness (generalized): Secondary | ICD-10-CM

## 2022-12-26 DIAGNOSIS — Z9181 History of falling: Secondary | ICD-10-CM | POA: Diagnosis not present

## 2022-12-26 DIAGNOSIS — M25551 Pain in right hip: Secondary | ICD-10-CM | POA: Diagnosis not present

## 2022-12-26 NOTE — Therapy (Signed)
OUTPATIENT PHYSICAL THERAPY LOWER EXTREMITY Treatment     Patient Name: Erik Odonnell MRN: [id number] DOB:26-Jul-1946, 78 y.o., male Today's Date: 12/26/2022  END OF SESSION:  PT End of Session - 12/26/22 1515    Visit Number 3    Number of Visits 8    Date for PT Re-Evaluation 01/20/23   Pt will be unavailable several dates in March   Authorization Type humana    Authorization Time Period 12 visits approved and 1 re eval from 12/13/22 - 01/20/23    Authorization - Visit Number 3    Authorization - Number of Visits 12    Progress Note Due on Visit 8    PT Start Time [id number]    PT Stop Time 1515    PT Time Calculation (min) 38 min    Activity Tolerance Patient tolerated treatment well    Behavior During Therapy WFL for tasks assessed/performed             Past Medical History:  Diagnosis Date   Arthritis    Diverticulosis    Gout    Hemifacial spasm    left side affected takes Artane daily;    History of bronchitis 11/2013   Hypertension    takes Ziac daily   Joint pain    Past Surgical History:  Procedure Laterality Date   CATARACT EXTRACTION, BILATERAL Bilateral 2015, 2016   COLONOSCOPY     COLONOSCOPY N/A 12/18/2017   diverticulosis and internal hemorhoids.   COLONOSCOPY WITH PROPOFOL N/A 08/07/2022   Procedure: COLONOSCOPY WITH PROPOFOL;  Surgeon: Eloise Harman, DO;  [facility];  Service: Endoscopy;  Laterality: N/A;  11:00 am   eye muschle surgeries  as a child   x 5   HERNIA REPAIR Left    as a child-inguinal;right inguinal in 1969   POLYPECTOMY  08/07/2022   Procedure: POLYPECTOMY;  Surgeon: Eloise Harman, DO;  Location: [facility];  Service: Endoscopy;;   TOTAL HIP ARTHROPLASTY Right 03/24/2014   Procedure: TOTAL HIP ARTHROPLASTY ANTERIOR APPROACH;  Surgeon: Ninetta Lights, MD;  Location: [facility];  Service: Orthopedics;  Laterality: Right;   TOTAL HIP ARTHROPLASTY Left 08/26/2018   Procedure: LEFT TOTAL HIP ARTHROPLASTY ANTERIOR  APPROACH;  Surgeon: Renette Butters, MD;  Location: [facility];  Service: Orthopedics;  Laterality: Left;   Patient Active Problem List   Diagnosis Date Noted   Hemorrhoids 09/11/2022   Rectal bleeding 07/11/2022   Hemifacial spasm of left side of face 05/18/2022   Primary osteoarthritis of hip 08/26/2018   Primary osteoarthritis of left hip 08/04/2018   Special screening for malignant neoplasms, colon 09/11/2017   Proximal leg weakness 04/07/2014   Difficulty in walking(719.7) 04/07/2014   Impairment of balance 04/07/2014   Primary localized osteoarthrosis, pelvic region and thigh 03/24/2014    PCP: [facility]  REFERRING PROVIDER: Renette Butters, MD  REFERRING DIAG:  M70.61 (ICD-10-CM) - Greater trochanteric bursitis, right    THERAPY DIAG:  Rt hip pain Muscle weakness  Rationale for Evaluation and Treatment: Rehabilitation  ONSET DATE: 10/10/2022     SUBJECTIVE STATEMENT: PT states been doing his exercises. No pain, he walked 2 miles the other day. PERTINENT HISTORY: Remote B THR with anterior approach  PAIN:  Are you having pain? No pain now; worst pain in the past week no pain but the week before he was hopping along.  PRECAUTIONS: None  WEIGHT BEARING RESTRICTIONS: No  FALLS:  Has patient fallen in last  6 months? No  LIVING ENVIRONMENT: Lives with: lives with their family Lives in: House/apartment Stairs: Yes: External: 6 steps; on right going up:  able to go reciprocal; increased pain if he was hurting.  Has following equipment at home:  has walker/cane at home from [facility] but no longer uses   PATIENT GOALS: less hip pain   NEXT MD VISIT: not sure   OBJECTIVE:   DIAGNOSTIC FINDINGS: Wellsite geologist took x ray and states that his THR was in great position.  PATIENT SURVEYS:  FOTO 61   MUSCLE LENGTH: Hamstrings: Right 125 deg; Left 135 deg   POSTURE: rounded shoulders, forward head, decreased lumbar lordosis, and increased thoracic  kyphosis  LOWER EXTREMITY MMT:  MMT Right eval Left eval  Hip flexion 3/5 4/5  Hip extension 3+/5 4/5  Hip abduction 3+/5  5/5  Hip adduction    Hip internal rotation    Hip external rotation    Knee flexion 5 5  Knee extension 5 5  Ankle dorsiflexion 5 5  Ankle plantarflexion    Ankle inversion    Ankle eversion     (Blank rows = not tested)   FUNCTIONAL TESTS:  30 seconds chair stand test :  13 average for age and sex  2 minute walk test: 400 ft with decrease ankle motion B noted leaning to LT  Single leg stance: RT: 0   ; LT   1    TODAY'S TREATMENT:                                                                                                                              DATE:  12/26/22 Standing: Wall arch x 10 Heel raise x 10 Squat x 10  Sitting:  Thoracic excursion x 3 Supine: Active hamstring stretch x 3  Knee to chest x 3  Bridge x 15  SLR B x 10 reps Side lying hip abduction x 10 B    12/20/22 Bridge 1 x 10  SLR 1 x 10 Supine active hamstring stretch 10 x 5 second holds Supine SKTC 10 x 5 second holds Sidelying hip abduction 2 x 10 bilateral  DKTC with heels on green ball 2 x 10 with 5 second holds Standing hip abduction RTB 2 x 10 bilateral    12/13/22 Eval    PATIENT EDUCATION:  Education details: HEP Person educated: Patient Education method: Explanation and Handouts Education comprehension: returned demonstration  HOME EXERCISE PROGRAM: Access Code: [email or web address] 12/26/22 - Sit to Stand Without Arm Support  - 1 x daily - 7 x weekly - 1 sets - 10 reps - 3-5: hold - Heel Raises with Counter Support  - 1 x daily - 7 x weekly - 1 sets - 10 reps - 3-5" hold - Mini Squat  - 1 x daily - 7 x weekly - 1 sets - 10 reps - 3-5" hold URL: [email or web address] 12/[email or web address] - Standing Hip Abduction with Counter Support  -  1 x daily - 7 x weekly - 2 sets - 10 reps  Date: 12/13/2022 - Supine Bridge  - 1 x daily - 7 x weekly - 1 sets - 10 reps -  3-5" hold - Supine Active Straight Leg Raise  - 1 x daily - 7 x weekly - 1 sets - 10 reps - 3-5 hold - Sidelying Hip Abduction  - 1 x daily - 7 x weekly - 1 sets - 10 reps - 3-5 hold - Supine Hamstring Stretch  - 1 x daily - 7 x weekly - 1 sets - 10 reps - 3-5 hold - Supine Single Knee to Chest Stretch  - 1 x daily - 7 x weekly - 1 sets - 10 reps - 3-5 hold  ASSESSMENT:  CLINICAL IMPRESSION:  Pt needed minimal cuing to perform exercises correctly.  Therapist added standing strengthening and sitting stretching exercises.  PT continues to have decreased strength but is improving in pain.  Patient will continue to benefit from physical therapy in order to improve function and reduce impairment.   OBJECTIVE IMPAIRMENTS: decreased activity tolerance, decreased balance, difficulty walking, decreased strength, and pain.   ACTIVITY LIMITATIONS: standing and locomotion level  PARTICIPATION LIMITATIONS: shopping and community activity   REHAB POTENTIAL: Good  CLINICAL DECISION MAKING: Stable/uncomplicated  EVALUATION COMPLEXITY: Low   GOALS: Goals reviewed with patient? No  SHORT TERM GOALS: Target date: 12/27/2022 PT to be I in HEP to have no pain after physical activity  Baseline: Goal status: MET  2.  Pt Rt LE muscle strength to increase 1/2 grade to be able to ambulate with equal wt bearing on LE  Baseline:  Goal status: IN PROGRESS   LONG TERM GOALS: Target date: 01/10/2023  PT to be I in an advanced HEP to improve hamstring flexiblity B by 10 degrees. Baseline:  Goal status: IN PROGRESS  2.   Pt Rt LE muscle strength to increase 1 grade to be able to be able to go up and down 12 steps in a reciprocal manner without difficulty Baseline:  Goal status: IN PROGRESS  3.  PT to be able to single leg stance on both LE for 5-10" to decrease falls  Baseline:  Goal status: IN PROGRESS   PLAN:  PT FREQUENCY: 2x/week  PT DURATION: 6 weeks  PLANNED INTERVENTIONS: Therapeutic  exercises, Therapeutic activity, Balance training, Gait training, Patient/Family education, Self Care, and Manual therapy  PLAN FOR NEXT SESSION: Continue with stretching and strengthening to decrease pain and improve mobility.   Rayetta Humphrey, [place] [phone]  [phone]

## 2022-12-31 ENCOUNTER — Encounter (HOSPITAL_COMMUNITY): Payer: Self-pay | Admitting: Physical Therapy

## 2022-12-31 ENCOUNTER — Ambulatory Visit (HOSPITAL_COMMUNITY): Payer: Medicare PPO | Admitting: Physical Therapy

## 2022-12-31 DIAGNOSIS — M6281 Muscle weakness (generalized): Secondary | ICD-10-CM | POA: Diagnosis not present

## 2022-12-31 DIAGNOSIS — M25551 Pain in right hip: Secondary | ICD-10-CM | POA: Diagnosis not present

## 2022-12-31 DIAGNOSIS — Z9181 History of falling: Secondary | ICD-10-CM | POA: Diagnosis not present

## 2022-12-31 NOTE — Therapy (Signed)
OUTPATIENT PHYSICAL THERAPY LOWER EXTREMITY Treatment     Patient Name: Erik Odonnell MRN: [id number] DOB:1946-05-30, 77 y.o., male Today's Date: 12/31/2022  END OF SESSION:   PT End of Session - 12/31/22 0944     Visit Number 4    Number of Visits 8    Date for PT Re-Evaluation 01/20/23   Pt will be unavailable several dates in March   Authorization Type humana    Authorization Time Period 12 visits approved and 1 re eval from 12/13/22 - 01/20/23    Authorization - Visit Number 4    Authorization - Number of Visits 12    Progress Note Due on Visit 8    PT Start Time 0947    PT Stop Time 1025    PT Time Calculation (min) 38 min    Activity Tolerance Patient tolerated treatment well    Behavior During Therapy WFL for tasks assessed/performed               Past Medical History:  Diagnosis Date   Arthritis    Diverticulosis    Gout    Hemifacial spasm    left side affected takes Artane daily;    History of bronchitis 11/2013   Hypertension    takes Ziac daily   Joint pain    Past Surgical History:  Procedure Laterality Date   CATARACT EXTRACTION, BILATERAL Bilateral 2015, 2016   COLONOSCOPY     COLONOSCOPY N/A 12/18/2017   diverticulosis and internal hemorhoids.   COLONOSCOPY WITH PROPOFOL N/A 08/07/2022   Procedure: COLONOSCOPY WITH PROPOFOL;  Surgeon: Eloise Harman, DO;  [facility]: [facility];  Service: Endoscopy;  Laterality: N/A;  11:00 am   eye muschle surgeries  as a child   x 5   HERNIA REPAIR Left    as a child-inguinal;right inguinal in 1969   POLYPECTOMY  08/07/2022   Procedure: POLYPECTOMY;  Surgeon: Eloise Harman, DO;  [facility];  Service: Endoscopy;;   TOTAL HIP ARTHROPLASTY Right 03/24/2014   Procedure: TOTAL HIP ARTHROPLASTY ANTERIOR APPROACH;  Surgeon: Ninetta Lights, MD;  Location: [facility];  Service: Orthopedics;  Laterality: Right;   TOTAL HIP ARTHROPLASTY Left 08/26/2018   Procedure: LEFT TOTAL HIP ARTHROPLASTY  ANTERIOR APPROACH;  Surgeon: Renette Butters, MD;  Location: [facility];  Service: Orthopedics;  Laterality: Left;   Patient Active Problem List   Diagnosis Date Noted   Hemorrhoids 09/11/2022   Rectal bleeding 07/11/2022   Hemifacial spasm of left side of face 05/18/2022   Primary osteoarthritis of hip 08/26/2018   Primary osteoarthritis of left hip 08/04/2018   Special screening for malignant neoplasms, colon 09/11/2017   Proximal leg weakness 04/07/2014   Difficulty in walking(719.7) 04/07/2014   Impairment of balance 04/07/2014   Primary localized osteoarthrosis, pelvic region and thigh 03/24/2014    PCP: [facility]  REFERRING PROVIDER: Renette Butters, MD  REFERRING DIAG:  M70.61 (ICD-10-CM) - Greater trochanteric bursitis, right    THERAPY DIAG:  Rt hip pain Muscle weakness  Rationale for Evaluation and Treatment: Rehabilitation  ONSET DATE: 10/10/2022     SUBJECTIVE STATEMENT: PT states hip is doing better. Hip feels better sometimes and then he does something to aggravate it usually lifting and moving heavy wood.    PERTINENT HISTORY: Remote B THR with anterior approach  PAIN:  Are you having pain? No pain now; worst pain in the past week no pain but the week before he was hopping along.  PRECAUTIONS: None  WEIGHT BEARING RESTRICTIONS: No  FALLS:  Has patient fallen in last 6 months? No  LIVING ENVIRONMENT: Lives with: lives with their family Lives in: House/apartment Stairs: Yes: External: 6 steps; on right going up:  able to go reciprocal; increased pain if he was hurting.  Has following equipment at home:  has walker/cane at home from [facility] but no longer uses   PATIENT GOALS: less hip pain   NEXT MD VISIT: not sure   OBJECTIVE:   DIAGNOSTIC FINDINGS: Wellsite geologist took x ray and states that his THR was in great position.  PATIENT SURVEYS:  FOTO 61   MUSCLE LENGTH: Hamstrings: Right 125 deg; Left 135 deg   POSTURE: rounded shoulders,  forward head, decreased lumbar lordosis, and increased thoracic kyphosis  LOWER EXTREMITY MMT:  MMT Right eval Left eval  Hip flexion 3/5 4/5  Hip extension 3+/5 4/5  Hip abduction 3+/5  5/5  Hip adduction    Hip internal rotation    Hip external rotation    Knee flexion 5 5  Knee extension 5 5  Ankle dorsiflexion 5 5  Ankle plantarflexion    Ankle inversion    Ankle eversion     (Blank rows = not tested)   FUNCTIONAL TESTS:  30 seconds chair stand test :  13 average for age and sex  2 minute walk test: 400 ft with decrease ankle motion B noted leaning to LT  Single leg stance: RT: 0   ; LT   1    TODAY'S TREATMENT:                                                                                                                              DATE:  12/31/22  Sidelying hip abduction 2 x 10 bilateral  Prone hip extension 2 x 10 bilateral 5 second holds Standing hip abduction GTB at knees 2 x 10  Standing hip extension GTB at knees 2 x 10  Step up 6 inch 2 x 10 bilateral  Lateral stepping 8 x 10 feet bilateral  Tandem stance 2 x 30 seconds bilateral  STS 2 x 10   12/26/22 Standing: Wall arch x 10 Heel raise x 10 Squat x 10  Sitting:  Thoracic excursion x 3 Supine: Active hamstring stretch x 3  Knee to chest x 3  Bridge x 15  SLR B x 10 reps Side lying hip abduction x 10 B    12/20/22 Bridge 1 x 10  SLR 1 x 10 Supine active hamstring stretch 10 x 5 second holds Supine SKTC 10 x 5 second holds Sidelying hip abduction 2 x 10 bilateral  DKTC with heels on green ball 2 x 10 with 5 second holds Standing hip abduction RTB 2 x 10 bilateral    12/13/22 Eval    PATIENT EDUCATION:  Education details: HEP Person educated: Patient Education method: Theatre stage manager Education comprehension: returned demonstration  HOME EXERCISE  PROGRAM: Access Code: [email or web address] 12/31/22- Standing Hip Abduction with Resistance at Ankles and Counter Support  - 1 x daily - 7 x  weekly - 2 sets - 10 reps  12/26/22 - Sit to Stand Without Arm Support  - 1 x daily - 7 x weekly - 1 sets - 10 reps - 3-5: hold - Heel Raises with Counter Support  - 1 x daily - 7 x weekly - 1 sets - 10 reps - 3-5" hold - Mini Squat  - 1 x daily - 7 x weekly - 1 sets - 10 reps - 3-5" hold URL: [email or web address] 12/[email or web address] - Standing Hip Abduction with Counter Support  - 1 x daily - 7 x weekly - 2 sets - 10 reps  Date: 12/13/2022 - Supine Bridge  - 1 x daily - 7 x weekly - 1 sets - 10 reps - 3-5" hold - Supine Active Straight Leg Raise  - 1 x daily - 7 x weekly - 1 sets - 10 reps - 3-5 hold - Sidelying Hip Abduction  - 1 x daily - 7 x weekly - 1 sets - 10 reps - 3-5 hold - Supine Hamstring Stretch  - 1 x daily - 7 x weekly - 1 sets - 10 reps - 3-5 hold - Supine Single Knee to Chest Stretch  - 1 x daily - 7 x weekly - 1 sets - 10 reps - 3-5 hold  ASSESSMENT:  CLINICAL IMPRESSION:  Patient requires cueing for mechanics and positioning of sidelying hip abduction exercise. Began additional standing and functional strengthening which are tolerated well. Moderate unsteadiness with tandem stance requiring frequent HHA. Patient will continue to benefit from physical therapy in order to improve function and reduce impairment.   OBJECTIVE IMPAIRMENTS: decreased activity tolerance, decreased balance, difficulty walking, decreased strength, and pain.   ACTIVITY LIMITATIONS: standing and locomotion level  PARTICIPATION LIMITATIONS: shopping and community activity   REHAB POTENTIAL: Good  CLINICAL DECISION MAKING: Stable/uncomplicated  EVALUATION COMPLEXITY: Low   GOALS: Goals reviewed with patient? No  SHORT TERM GOALS: Target date: 12/27/2022 PT to be I in HEP to have no pain after physical activity  Baseline: Goal status: MET  2.  Pt Rt LE muscle strength to increase 1/2 grade to be able to ambulate with equal wt bearing on LE  Baseline:  Goal status: IN PROGRESS   LONG  TERM GOALS: Target date: 01/10/2023  PT to be I in an advanced HEP to improve hamstring flexiblity B by 10 degrees. Baseline:  Goal status: IN PROGRESS  2.   Pt Rt LE muscle strength to increase 1 grade to be able to be able to go up and down 12 steps in a reciprocal manner without difficulty Baseline:  Goal status: IN PROGRESS  3.  PT to be able to single leg stance on both LE for 5-10" to decrease falls  Baseline:  Goal status: IN PROGRESS   PLAN:  PT FREQUENCY: 2x/week  PT DURATION: 6 weeks  PLANNED INTERVENTIONS: Therapeutic exercises, Therapeutic activity, Balance training, Gait training, Patient/Family education, Self Care, and Manual therapy  PLAN FOR NEXT SESSION: Continue with stretching and strengthening to decrease pain and improve mobility.   9:45 AM, 12/31/22 Mearl Latin PT, DPT Physical Therapist at [facility]

## 2023-01-04 ENCOUNTER — Ambulatory Visit (HOSPITAL_COMMUNITY): Payer: Medicare PPO | Admitting: Physical Therapy

## 2023-01-04 ENCOUNTER — Encounter (HOSPITAL_COMMUNITY): Payer: Medicare PPO | Admitting: Physical Therapy

## 2023-01-04 DIAGNOSIS — M6281 Muscle weakness (generalized): Secondary | ICD-10-CM

## 2023-01-04 DIAGNOSIS — M25551 Pain in right hip: Secondary | ICD-10-CM

## 2023-01-04 DIAGNOSIS — Z9181 History of falling: Secondary | ICD-10-CM | POA: Diagnosis not present

## 2023-01-04 NOTE — Therapy (Signed)
OUTPATIENT PHYSICAL THERAPY LOWER EXTREMITY Treatment     Patient Name: Erik Odonnell MRN: [id number] DOB:1945/11/19, 77 y.o., male Today's Date: 01/04/2023  END OF SESSION:   PT End of Session - 01/04/23 1406     Visit Number 5    Number of Visits 8    Date for PT Re-Evaluation 01/20/23   Pt will be unavailable several dates in March   Authorization Type humana    Authorization Time Period 12 visits approved and 1 re eval from 12/13/22 - 01/20/23    Authorization - Visit Number 5    Authorization - Number of Visits 12    Progress Note Due on Visit 8    PT Start Time 1325    PT Stop Time 1410    PT Time Calculation (min) 45 min    Activity Tolerance Patient tolerated treatment well    Behavior During Therapy WFL for tasks assessed/performed                Past Medical History:  Diagnosis Date   Arthritis    Diverticulosis    Gout    Hemifacial spasm    left side affected takes Artane daily;    History of bronchitis 11/2013   Hypertension    takes Ziac daily   Joint pain    Past Surgical History:  Procedure Laterality Date   CATARACT EXTRACTION, BILATERAL Bilateral 2015, 2016   COLONOSCOPY     COLONOSCOPY N/A 12/18/2017   diverticulosis and internal hemorhoids.   COLONOSCOPY WITH PROPOFOL N/A 08/07/2022   Procedure: COLONOSCOPY WITH PROPOFOL;  Surgeon: Eloise Harman, DO;  [facility]: [facility];  Service: Endoscopy;  Laterality: N/A;  11:00 am   eye muschle surgeries  as a child   x 5   HERNIA REPAIR Left    as a child-inguinal;right inguinal in 1969   POLYPECTOMY  08/07/2022   Procedure: POLYPECTOMY;  Surgeon: Eloise Harman, DO;  Location: [facility];  Service: Endoscopy;;   TOTAL HIP ARTHROPLASTY Right 03/24/2014   Procedure: TOTAL HIP ARTHROPLASTY ANTERIOR APPROACH;  Surgeon: Ninetta Lights, MD;  Location: [place];  Service: Orthopedics;  Laterality: Right;   TOTAL HIP ARTHROPLASTY Left 08/26/2018   Procedure: LEFT TOTAL HIP ARTHROPLASTY  ANTERIOR APPROACH;  Surgeon: Renette Butters, MD;  Location: [facility];  Service: Orthopedics;  Laterality: Left;   Patient Active Problem List   Diagnosis Date Noted   Hemorrhoids 09/11/2022   Rectal bleeding 07/11/2022   Hemifacial spasm of left side of face 05/18/2022   Primary osteoarthritis of hip 08/26/2018   Primary osteoarthritis of left hip 08/04/2018   Special screening for malignant neoplasms, colon 09/11/2017   Proximal leg weakness 04/07/2014   Difficulty in walking(719.7) 04/07/2014   Impairment of balance 04/07/2014   Primary localized osteoarthrosis, pelvic region and thigh 03/24/2014    PCP: [facility]  REFERRING PROVIDER: Renette Butters, MD  REFERRING DIAG:  M70.61 (ICD-10-CM) - Greater trochanteric bursitis, right    THERAPY DIAG:  Rt hip pain Muscle weakness  Rationale for Evaluation and Treatment: Rehabilitation  ONSET DATE: 10/10/2022     SUBJECTIVE STATEMENT: PT states that his hip aches when he exercise. Pt states that when he walks it gets better.    PERTINENT HISTORY: Remote B THR with anterior approach  PAIN:  Are you having pain? 3 pain now; worst pain in the past week  PRECAUTIONS: None  WEIGHT BEARING RESTRICTIONS: No  FALLS:  Has patient fallen in  last 6 months? No  LIVING ENVIRONMENT: Lives with: lives with their family Lives in: House/apartment Stairs: Yes: External: 6 steps; on right going up:  able to go reciprocal; increased pain if he was hurting.  Has following equipment at home:  has walker/cane at home from [facility] but no longer uses   PATIENT GOALS: less hip pain   NEXT MD VISIT: not sure   OBJECTIVE:   DIAGNOSTIC FINDINGS: Wellsite geologist took x ray and states that his THR was in great position.  PATIENT SURVEYS:  FOTO 61   MUSCLE LENGTH: Hamstrings: Right 125 deg; Left 135 deg   POSTURE: rounded shoulders, forward head, decreased lumbar lordosis, and increased thoracic kyphosis  LOWER EXTREMITY  MMT:  MMT Right eval Left eval  Hip flexion 3/5 4/5  Hip extension 3+/5 4/5  Hip abduction 3+/5  5/5  Hip adduction    Hip internal rotation    Hip external rotation    Knee flexion 5 5  Knee extension 5 5  Ankle dorsiflexion 5 5  Ankle plantarflexion    Ankle inversion    Ankle eversion     (Blank rows = not tested)   FUNCTIONAL TESTS:  30 seconds chair stand test :  13 average for age and sex : 3/15: 3  which is good for age and sex  2 minute walk test: 400 ft with decrease ankle motion B noted leaning to LT;3/15  less leaning and walked 448 ft.  Single leg stance: RT: 0   ; LT   1;  3/15:  both 2 seconds     TODAY'S TREATMENT:                                                                                                                              DATE:  01/04/23 Standing: Side stepping with 5 pl at body craft x 3 Retro walking with 4 pl at body craft x 3 Leg press 4# x 15  Sitting: Sit to stand x 16  Stand : Single leg stance: x 5 reps each Slant board stretch 30" x 3  Tandem stance B  Hip abduction green theraband x 20 Hip extension green theraband x 20   12/31/22  Sidelying hip abduction 2 x 10 bilateral  Prone hip extension 2 x 10 bilateral 5 second holds Standing hip abduction GTB at knees 2 x 10  Standing hip extension GTB at knees 2 x 10  Step up 6 inch 2 x 10 bilateral  Lateral stepping 8 x 10 feet bilateral  Tandem stance 2 x 30 seconds bilateral  STS 2 x 10   12/26/22 Standing: Wall arch x 10 Heel raise x 10 Squat x 10  Sitting:  Thoracic excursion x 3 Supine: Active hamstring stretch x 3  Knee to chest x 3  Bridge x 15  SLR B x 10 reps Side lying hip abduction x 10 B    12/20/22 Bridge 1 x  10  SLR 1 x 10 Supine active hamstring stretch 10 x 5 second holds Supine SKTC 10 x 5 second holds Sidelying hip abduction 2 x 10 bilateral  DKTC with heels on green ball 2 x 10 with 5 second holds Standing hip abduction RTB 2 x 10 bilateral     12/13/22 Eval    PATIENT EDUCATION:  Education details: HEP Person educated: Patient Education method: Explanation and Handouts Education comprehension: returned demonstration  HOME EXERCISE PROGRAM: Access Code: [email or web address] 12/31/22- Standing Hip Abduction with Resistance at Ankles and Counter Support  - 1 x daily - 7 x weekly - 2 sets - 10 reps  12/26/22 - Sit to Stand Without Arm Support  - 1 x daily - 7 x weekly - 1 sets - 10 reps - 3-5: hold - Heel Raises with Counter Support  - 1 x daily - 7 x weekly - 1 sets - 10 reps - 3-5" hold - Mini Squat  - 1 x daily - 7 x weekly - 1 sets - 10 reps - 3-5" hold URL: [email or web address] 12/[email or web address] - Standing Hip Abduction with Counter Support  - 1 x daily - 7 x weekly - 2 sets - 10 reps  Date: 12/13/2022 - Supine Bridge  - 1 x daily - 7 x weekly - 1 sets - 10 reps - 3-5" hold - Supine Active Straight Leg Raise  - 1 x daily - 7 x weekly - 1 sets - 10 reps - 3-5 hold - Sidelying Hip Abduction  - 1 x daily - 7 x weekly - 1 sets - 10 reps - 3-5 hold - Supine Hamstring Stretch  - 1 x daily - 7 x weekly - 1 sets - 10 reps - 3-5 hold - Supine Single Knee to Chest Stretch  - 1 x daily - 7 x weekly - 1 sets - 10 reps - 3-5 hold  ASSESSMENT:  CLINICAL IMPRESSION:  Functional testing retested with noted improvement.  PT continues to have difficulty with balance activities.  Patient requires minimal cueing for mechanics of exercise. Began additional standing and functional strengthening which are tolerated well. Moderate unsteadiness with tandem stance requiring frequent HHA. Patient will continue to benefit from physical therapy in order to improve function and reduce impairment.   OBJECTIVE IMPAIRMENTS: decreased activity tolerance, decreased balance, difficulty walking, decreased strength, and pain.   ACTIVITY LIMITATIONS: standing and locomotion level  PARTICIPATION LIMITATIONS: shopping and community activity   REHAB POTENTIAL:  Good  CLINICAL DECISION MAKING: Stable/uncomplicated  EVALUATION COMPLEXITY: Low   GOALS: Goals reviewed with patient? No  SHORT TERM GOALS: Target date: 12/27/2022 PT to be I in HEP to have no pain after physical activity  Baseline: Goal status: MET  2.  Pt Rt LE muscle strength to increase 1/2 grade to be able to ambulate with equal wt bearing on LE  Baseline:  Goal status: IN PROGRESS   LONG TERM GOALS: Target date: 01/10/2023  PT to be I in an advanced HEP to improve hamstring flexiblity B by 10 degrees. Baseline:  Goal status: IN PROGRESS  2.   Pt Rt LE muscle strength to increase 1 grade to be able to be able to go up and down 12 steps in a reciprocal manner without difficulty Baseline:  Goal status: IN PROGRESS  3.  PT to be able to single leg stance on both LE for 5-10" to decrease falls  Baseline:  Goal status: IN PROGRESS   PLAN:  PT FREQUENCY: 2x/week  PT DURATION: 6 weeks  PLANNED INTERVENTIONS: Therapeutic exercises, Therapeutic activity, Balance training, Gait training, Patient/Family education, Self Care, and Manual therapy  PLAN FOR NEXT SESSION: Work on steps.  Continue with stretching and strengthening to decrease pain and improve mobility.   Rayetta Humphrey PT, CLT [phone]

## 2023-01-08 ENCOUNTER — Encounter (HOSPITAL_COMMUNITY): Payer: Self-pay | Admitting: Physical Therapy

## 2023-01-08 ENCOUNTER — Ambulatory Visit (HOSPITAL_COMMUNITY): Payer: Medicare PPO | Admitting: Physical Therapy

## 2023-01-08 DIAGNOSIS — Z9181 History of falling: Secondary | ICD-10-CM

## 2023-01-08 DIAGNOSIS — M25551 Pain in right hip: Secondary | ICD-10-CM

## 2023-01-08 DIAGNOSIS — M6281 Muscle weakness (generalized): Secondary | ICD-10-CM | POA: Diagnosis not present

## 2023-01-08 NOTE — Therapy (Signed)
OUTPATIENT PHYSICAL THERAPY LOWER EXTREMITY Treatment     Patient Name: RHEN LORDAN MRN: [id number] DOB:02-25-1946, 77 y.o., male Today's Date: 01/08/2023  END OF SESSION:   PT End of Session - 01/08/23 1304     Visit Number 6    Number of Visits 8    Date for PT Re-Evaluation 01/20/23   Pt will be unavailable several dates in March   Authorization Type humana    Authorization Time Period 12 visits approved and 1 re eval from 12/13/22 - 01/20/23    Authorization - Number of Visits 12    Progress Note Due on Visit 8    PT Start Time [id number]    PT Stop Time 1343    PT Time Calculation (min) 38 min    Activity Tolerance Patient tolerated treatment well    Behavior During Therapy WFL for tasks assessed/performed                Past Medical History:  Diagnosis Date   Arthritis    Diverticulosis    Gout    Hemifacial spasm    left side affected takes Artane daily;    History of bronchitis 11/2013   Hypertension    takes Ziac daily   Joint pain    Past Surgical History:  Procedure Laterality Date   CATARACT EXTRACTION, BILATERAL Bilateral 2015, 2016   COLONOSCOPY     COLONOSCOPY N/A 12/18/2017   diverticulosis and internal hemorhoids.   COLONOSCOPY WITH PROPOFOL N/A 08/07/2022   Procedure: COLONOSCOPY WITH PROPOFOL;  Surgeon: Eloise Harman, DO;  [facility];  Service: Endoscopy;  Laterality: N/A;  11:00 am   eye muschle surgeries  as a child   x 5   HERNIA REPAIR Left    as a child-inguinal;right inguinal in 1969   POLYPECTOMY  08/07/2022   Procedure: POLYPECTOMY;  Surgeon: Eloise Harman, DO;  [facility];  Service: Endoscopy;;   TOTAL HIP ARTHROPLASTY Right 03/24/2014   Procedure: TOTAL HIP ARTHROPLASTY ANTERIOR APPROACH;  Surgeon: Ninetta Lights, MD;  Location: [place];  Service: Orthopedics;  Laterality: Right;   TOTAL HIP ARTHROPLASTY Left 08/26/2018   Procedure: LEFT TOTAL HIP ARTHROPLASTY ANTERIOR APPROACH;  Surgeon: Renette Butters, MD;  Location: [facility];  Service: Orthopedics;  Laterality: Left;   Patient Active Problem List   Diagnosis Date Noted   Hemorrhoids 09/11/2022   Rectal bleeding 07/11/2022   Hemifacial spasm of left side of face 05/18/2022   Primary osteoarthritis of hip 08/26/2018   Primary osteoarthritis of left hip 08/04/2018   Special screening for malignant neoplasms, colon 09/11/2017   Proximal leg weakness 04/07/2014   Difficulty in walking(719.7) 04/07/2014   Impairment of balance 04/07/2014   Primary localized osteoarthrosis, pelvic region and thigh 03/24/2014    PCP: [facility]  REFERRING PROVIDER: Renette Butters, MD  REFERRING DIAG:  M70.61 (ICD-10-CM) - Greater trochanteric bursitis, right    THERAPY DIAG:  Rt hip pain Muscle weakness  Rationale for Evaluation and Treatment: Rehabilitation  ONSET DATE: 10/10/2022     SUBJECTIVE STATEMENT: PT states his hip feels better with movement. Been cutting firewood for heating his house.    PERTINENT HISTORY: Remote B THR with anterior approach  PAIN:  Are you having pain? 3 pain now; worst pain in the past week  PRECAUTIONS: None  WEIGHT BEARING RESTRICTIONS: No  FALLS:  Has patient fallen in last 6 months? No  LIVING ENVIRONMENT: Lives with: lives with  their family Lives in: House/apartment Stairs: Yes: External: 6 steps; on right going up:  able to go reciprocal; increased pain if he was hurting.  Has following equipment at home:  has walker/cane at home from [facility] but no longer uses   PATIENT GOALS: less hip pain   NEXT MD VISIT: not sure   OBJECTIVE:   DIAGNOSTIC FINDINGS: Wellsite geologist took x ray and states that his THR was in great position.  PATIENT SURVEYS:  FOTO 61   MUSCLE LENGTH: Hamstrings: Right 125 deg; Left 135 deg   POSTURE: rounded shoulders, forward head, decreased lumbar lordosis, and increased thoracic kyphosis  LOWER EXTREMITY MMT:  MMT Right eval Left eval  Hip  flexion 3/5 4/5  Hip extension 3+/5 4/5  Hip abduction 3+/5  5/5  Hip adduction    Hip internal rotation    Hip external rotation    Knee flexion 5 5  Knee extension 5 5  Ankle dorsiflexion 5 5  Ankle plantarflexion    Ankle inversion    Ankle eversion     (Blank rows = not tested)   FUNCTIONAL TESTS:  30 seconds chair stand test :  13 average for age and sex : 3/15: 82  which is good for age and sex  2 minute walk test: 400 ft with decrease ankle motion B noted leaning to LT;3/15  less leaning and walked 448 ft.  Single leg stance: RT: 0   ; LT   1;  3/15:  both 2 seconds     TODAY'S TREATMENT:                                                                                                                              DATE:  01/08/23 Step up 7 inch 2 x 10  bilateral  Lateral step up 7 inch 2 x 10 bilateral  Lateral step down 7 inch 1 x 10 bilateral  Tandem stance 3 x 30 second holds bilateral  SLS 3 x 30 second holds bilateral with frequent HHA Retro walking with 4 pl at body craft x 10 Side stepping with 3 pl at body craft x 5 bilateral    01/04/23 Standing: Side stepping with 5 pl at body craft x 3 Retro walking with 4 pl at body craft x 3 Leg press 4# x 15  Sitting: Sit to stand x 16  Stand : Single leg stance: x 5 reps each Slant board stretch 30" x 3  Tandem stance B  Hip abduction green theraband x 20 Hip extension green theraband x 20   12/31/22  Sidelying hip abduction 2 x 10 bilateral  Prone hip extension 2 x 10 bilateral 5 second holds Standing hip abduction GTB at knees 2 x 10  Standing hip extension GTB at knees 2 x 10  Step up 6 inch 2 x 10 bilateral  Lateral stepping 8 x 10 feet bilateral  Tandem stance 2 x 30 seconds bilateral  STS 2 x 10   12/26/22 Standing: Wall arch x 10 Heel raise x 10 Squat x 10  Sitting:  Thoracic excursion x 3 Supine: Active hamstring stretch x 3  Knee to chest x 3  Bridge x 15  SLR B x 10 reps Side lying hip  abduction x 10 B    12/20/22 Bridge 1 x 10  SLR 1 x 10 Supine active hamstring stretch 10 x 5 second holds Supine SKTC 10 x 5 second holds Sidelying hip abduction 2 x 10 bilateral  DKTC with heels on green ball 2 x 10 with 5 second holds Standing hip abduction RTB 2 x 10 bilateral    12/13/22 Eval    PATIENT EDUCATION:  Education details: HEP Person educated: Patient Education method: Explanation and Handouts Education comprehension: returned demonstration  HOME EXERCISE PROGRAM: Access Code: [email or web address] 12/31/22- Standing Hip Abduction with Resistance at Ankles and Counter Support  - 1 x daily - 7 x weekly - 2 sets - 10 reps  12/26/22 - Sit to Stand Without Arm Support  - 1 x daily - 7 x weekly - 1 sets - 10 reps - 3-5: hold - Heel Raises with Counter Support  - 1 x daily - 7 x weekly - 1 sets - 10 reps - 3-5" hold - Mini Squat  - 1 x daily - 7 x weekly - 1 sets - 10 reps - 3-5" hold URL: [email or web address] 12/[email or web address] - Standing Hip Abduction with Counter Support  - 1 x daily - 7 x weekly - 2 sets - 10 reps  Date: 12/13/2022 - Supine Bridge  - 1 x daily - 7 x weekly - 1 sets - 10 reps - 3-5" hold - Supine Active Straight Leg Raise  - 1 x daily - 7 x weekly - 1 sets - 10 reps - 3-5 hold - Sidelying Hip Abduction  - 1 x daily - 7 x weekly - 1 sets - 10 reps - 3-5 hold - Supine Hamstring Stretch  - 1 x daily - 7 x weekly - 1 sets - 10 reps - 3-5 hold - Supine Single Knee to Chest Stretch  - 1 x daily - 7 x weekly - 1 sets - 10 reps - 3-5 hold  ASSESSMENT:  CLINICAL IMPRESSION:  Continued with functional strengthening and stair exercises which are tolerated well but increased HHA required with lateral step down. Frequent cueing for mechanics and rest breaks throughout session. Demonstrates impaired balance with static balance exercises requiring frequent UE support with poor SLS ability. Continued with weighted retro and lateral gait but unsteady with single limb support,  provided cueing for bracing through trunk. Patient will continue to benefit from physical therapy in order to improve function and reduce impairment.   OBJECTIVE IMPAIRMENTS: decreased activity tolerance, decreased balance, difficulty walking, decreased strength, and pain.   ACTIVITY LIMITATIONS: standing and locomotion level  PARTICIPATION LIMITATIONS: shopping and community activity   REHAB POTENTIAL: Good  CLINICAL DECISION MAKING: Stable/uncomplicated  EVALUATION COMPLEXITY: Low   GOALS: Goals reviewed with patient? No  SHORT TERM GOALS: Target date: 12/27/2022 PT to be I in HEP to have no pain after physical activity  Baseline: Goal status: MET  2.  Pt Rt LE muscle strength to increase 1/2 grade to be able to ambulate with equal wt bearing on LE  Baseline:  Goal status: IN PROGRESS   LONG TERM GOALS: Target date: 01/10/2023  PT to be I in an advanced HEP to improve hamstring flexiblity  B by 10 degrees. Baseline:  Goal status: IN PROGRESS  2.   Pt Rt LE muscle strength to increase 1 grade to be able to be able to go up and down 12 steps in a reciprocal manner without difficulty Baseline:  Goal status: IN PROGRESS  3.  PT to be able to single leg stance on both LE for 5-10" to decrease falls  Baseline:  Goal status: IN PROGRESS   PLAN:  PT FREQUENCY: 2x/week  PT DURATION: 6 weeks  PLANNED INTERVENTIONS: Therapeutic exercises, Therapeutic activity, Balance training, Gait training, Patient/Family education, Self Care, and Manual therapy  PLAN FOR NEXT SESSION: Work on steps.  Continue with stretching and strengthening to decrease pain and improve mobility.   1:07 PM, 01/08/23 Mearl Latin PT, DPT Physical Therapist at [facility]

## 2023-01-10 ENCOUNTER — Ambulatory Visit (HOSPITAL_COMMUNITY): Payer: Medicare PPO | Admitting: Physical Therapy

## 2023-01-10 DIAGNOSIS — M25551 Pain in right hip: Secondary | ICD-10-CM | POA: Diagnosis not present

## 2023-01-10 DIAGNOSIS — Z9181 History of falling: Secondary | ICD-10-CM | POA: Diagnosis not present

## 2023-01-10 DIAGNOSIS — M6281 Muscle weakness (generalized): Secondary | ICD-10-CM

## 2023-01-10 NOTE — Therapy (Signed)
OUTPATIENT PHYSICAL THERAPY LOWER EXTREMITY Treatment     Patient Name: Erik Odonnell MRN: [id number] DOB:1946/04/03, 77 y.o., male Today's Date: 01/10/2023  END OF SESSION:   PT End of Session - 01/10/23 1345    Visit Number 7    Number of Visits 8    Date for PT Re-Evaluation 01/20/23   Pt will be unavailable several dates in March   Authorization Type humana    Authorization Time Period 12 visits approved and 1 re eval from 12/13/22 - 01/20/23    Authorization - Visit Number 7    Authorization - Number of Visits 12    Progress Note Due on Visit 8    PT Start Time [id number]    PT Stop Time 1345    PT Time Calculation (min) 40 min    Activity Tolerance Patient tolerated treatment well    Behavior During Therapy WFL for tasks assessed/performed                Past Medical History:  Diagnosis Date   Arthritis    Diverticulosis    Gout    Hemifacial spasm    left side affected takes Artane daily;    History of bronchitis 11/2013   Hypertension    takes Ziac daily   Joint pain    Past Surgical History:  Procedure Laterality Date   CATARACT EXTRACTION, BILATERAL Bilateral 2015, 2016   COLONOSCOPY     COLONOSCOPY N/A 12/18/2017   diverticulosis and internal hemorhoids.   COLONOSCOPY WITH PROPOFOL N/A 08/07/2022   Procedure: COLONOSCOPY WITH PROPOFOL;  Surgeon: Eloise Harman, DO;  [facility]: [facility];  Service: Endoscopy;  Laterality: N/A;  11:00 am   eye muschle surgeries  as a child   x 5   HERNIA REPAIR Left    as a child-inguinal;right inguinal in 1969   POLYPECTOMY  08/07/2022   Procedure: POLYPECTOMY;  Surgeon: Eloise Harman, DO;  Location: [facility];  Service: Endoscopy;;   TOTAL HIP ARTHROPLASTY Right 03/24/2014   Procedure: TOTAL HIP ARTHROPLASTY ANTERIOR APPROACH;  Surgeon: Ninetta Lights, MD;  Location: [facility];  Service: Orthopedics;  Laterality: Right;   TOTAL HIP ARTHROPLASTY Left 08/26/2018   Procedure: LEFT TOTAL HIP ARTHROPLASTY  ANTERIOR APPROACH;  Surgeon: Renette Butters, MD;  Location: [facility];  Service: Orthopedics;  Laterality: Left;   Patient Active Problem List   Diagnosis Date Noted   Hemorrhoids 09/11/2022   Rectal bleeding 07/11/2022   Hemifacial spasm of left side of face 05/18/2022   Primary osteoarthritis of hip 08/26/2018   Primary osteoarthritis of left hip 08/04/2018   Special screening for malignant neoplasms, colon 09/11/2017   Proximal leg weakness 04/07/2014   Difficulty in walking(719.7) 04/07/2014   Impairment of balance 04/07/2014   Primary localized osteoarthrosis, pelvic region and thigh 03/24/2014    PCP: [facility]  REFERRING PROVIDER: Renette Butters, MD  REFERRING DIAG:  M70.61 (ICD-10-CM) - Greater trochanteric bursitis, right    THERAPY DIAG:  Rt hip pain Muscle weakness  Rationale for Evaluation and Treatment: Rehabilitation  ONSET DATE: 10/10/2022     SUBJECTIVE STATEMENT: Pt states that he split wood this morning. Pt states that he only hurts on his Rt no pain on his lt.    PERTINENT HISTORY: Remote B THR with anterior approach  PAIN:  Are you having pain? 2 pain now; worst pain in the past week  PRECAUTIONS: None  WEIGHT BEARING RESTRICTIONS: No  FALLS:  Has  patient fallen in last 6 months? No  LIVING ENVIRONMENT: Lives with: lives with their family Lives in: House/apartment Stairs: Yes: External: 6 steps; on right going up:  able to go reciprocal; increased pain if he was hurting.  Has following equipment at home:  has walker/cane at home from [facility] but no longer uses   PATIENT GOALS: less hip pain   NEXT MD VISIT: not sure   OBJECTIVE:   DIAGNOSTIC FINDINGS: Wellsite geologist took x ray and states that his THR was in great position.  PATIENT SURVEYS:  FOTO 61   MUSCLE LENGTH: Hamstrings: Right 125 deg; Left 135 deg   POSTURE: rounded shoulders, forward head, decreased lumbar lordosis, and increased thoracic kyphosis  LOWER  EXTREMITY MMT:  MMT Right eval 3/21 Left eval 3/21  Hip flexion 3/5 4/5 4/5 4+/5  Hip extension 3+/5 4/4 4/5 4/5  Hip abduction 3+/5  4+5 5/5 5/5  Hip adduction      Hip internal rotation      Hip external rotation      Knee flexion 5  5   Knee extension 5  5   Ankle dorsiflexion 5  5   Ankle plantarflexion      Ankle inversion      Ankle eversion       (Blank rows = not tested)   FUNCTIONAL TESTS:  30 seconds chair stand test :  13 average for age and sex : 3/15: 20  which is good for age and sex  2 minute walk test: 400 ft with decrease ankle motion B noted leaning to LT;3/15  less leaning and walked 448 ft.  Single leg stance: RT: 0   ; LT   1;  3/15:  both 2 seconds     TODAY'S TREATMENT:                                                                                                                              DATE:  01/10/23 Nustep for warm up x 5 minutes hills 3 level 3  ITB stretch 3 x 30" Tandem stance 5x  Side step with green tband x 2 Rt Functional squat with green t-band around thighs x 10  Prone: hip extension x 10 B  Quad stretch B 2 x 30" Supine knee to chest stretch 30" x 2  Piriformis stretch B 30" B SLR x 10 B Bridge with resisted hip abduction with green theraband x 10 hold 5"  01/08/23 Step up 7 inch 2 x 10  bilateral  Lateral step up 7 inch 2 x 10 bilateral  Lateral step down 7 inch 1 x 10 bilateral  Tandem stance 3 x 30 second holds bilateral  SLS 3 x 30 second holds bilateral with frequent HHA Retro walking with 4 pl at body craft x 10 Side stepping with 3 pl at body craft x 5 bilateral    01/04/23 Standing: Side stepping with 5 pl at body  craft x 3 Retro walking with 4 pl at body craft x 3 Leg press 4# x 15  Sitting: Sit to stand x 16  Stand : Single leg stance: x 5 reps each Slant board stretch 30" x 3  Tandem stance B  Hip abduction green theraband x 20 Hip extension green theraband x 20   12/31/22  Sidelying hip abduction 2 x  10 bilateral  Prone hip extension 2 x 10 bilateral 5 second holds Standing hip abduction GTB at knees 2 x 10  Standing hip extension GTB at knees 2 x 10  Step up 6 inch 2 x 10 bilateral  Lateral stepping 8 x 10 feet bilateral  Tandem stance 2 x 30 seconds bilateral  STS 2 x 10   12/26/22 Standing: Wall arch x 10 Heel raise x 10 Squat x 10  Sitting:  Thoracic excursion x 3 Supine: Active hamstring stretch x 3  Knee to chest x 3  Bridge x 15  SLR B x 10 reps Side lying hip abduction x 10 B    PATIENT EDUCATION:  Education details: HEP Person educated: Patient Education method: Explanation and Handouts Education comprehension: returned demonstration  HOME EXERCISE PROGRAM: Access Code: [email or web address] 12/31/22- Standing Hip Abduction with Resistance at Ankles and Counter Support  - 1 x daily - 7 x weekly - 2 sets - 10 reps  12/26/22 - Sit to Stand Without Arm Support  - 1 x daily - 7 x weekly - 1 sets - 10 reps - 3-5: hold - Heel Raises with Counter Support  - 1 x daily - 7 x weekly - 1 sets - 10 reps - 3-5" hold - Mini Squat  - 1 x daily - 7 x weekly - 1 sets - 10 reps - 3-5" hold URL: [email or web address] 12/[email or web address] - Standing Hip Abduction with Counter Support  - 1 x daily - 7 x weekly - 2 sets - 10 reps  Date: 12/13/2022 - Supine Bridge  - 1 x daily - 7 x weekly - 1 sets - 10 reps - 3-5" hold - Supine Active Straight Leg Raise  - 1 x daily - 7 x weekly - 1 sets - 10 reps - 3-5 hold - Sidelying Hip Abduction  - 1 x daily - 7 x weekly - 1 sets - 10 reps - 3-5 hold - Supine Hamstring Stretch  - 1 x daily - 7 x weekly - 1 sets - 10 reps - 3-5 hold - Supine Single Knee to Chest Stretch  - 1 x daily - 7 x weekly - 1 sets - 10 reps - 3-5 hold  ASSESSMENT:  CLINICAL IMPRESSION:  Pt mm strength tested which shows improved strength.Treatment focused on stretching, as well as strength and balance.  Pt continues to have weakness in B hip Rt greater than left.  Demonstrates  impaired balance with static balance exercises requiring frequent UE support with poor SLS ability. Continued with weighted retro and lateral gait but unsteady with single limb support, provided cueing for bracing through trunk. Patient will continue to benefit from physical therapy in order to improve function and reduce impairment.   OBJECTIVE IMPAIRMENTS: decreased activity tolerance, decreased balance, difficulty walking, decreased strength, and pain.   ACTIVITY LIMITATIONS: standing and locomotion level  PARTICIPATION LIMITATIONS: shopping and community activity   REHAB POTENTIAL: Good  CLINICAL DECISION MAKING: Stable/uncomplicated  EVALUATION COMPLEXITY: Low   GOALS: Goals reviewed with patient? No  SHORT TERM GOALS: Target date: 12/27/2022 PT to be I  in HEP to have no pain after physical activity  Baseline: Goal status: MET  2.  Pt Rt LE muscle strength to increase 1/2 grade to be able to ambulate with equal wt bearing on LE  Baseline:  Goal status: IN PROGRESS   LONG TERM GOALS: Target date: 01/10/2023  PT to be I in an advanced HEP to improve hamstring flexiblity B by 10 degrees. Baseline:  Goal status: IN PROGRESS  2.   Pt Rt LE muscle strength to increase 1 grade to be able to be able to go up and down 12 steps in a reciprocal manner without difficulty Baseline:  Goal status: IN PROGRESS  3.  PT to be able to single leg stance on both LE for 5-10" to decrease falls  Baseline:  Goal status: IN PROGRESS   PLAN:  PT FREQUENCY: 2x/week  PT DURATION: 6 weeks  PLANNED INTERVENTIONS: Therapeutic exercises, Therapeutic activity, Balance training, Gait training, Patient/Family education, Self Care, and Manual therapy  PLAN FOR NEXT SESSION: Work on steps.  Continue with stretching and strengthening to decrease pain and improve mobility.   Rayetta Humphrey, [place] CLT [phone]

## 2023-01-14 ENCOUNTER — Ambulatory Visit (HOSPITAL_COMMUNITY): Payer: Medicare PPO | Admitting: Physical Therapy

## 2023-01-14 DIAGNOSIS — Z9181 History of falling: Secondary | ICD-10-CM | POA: Diagnosis not present

## 2023-01-14 DIAGNOSIS — M6281 Muscle weakness (generalized): Secondary | ICD-10-CM

## 2023-01-14 DIAGNOSIS — M25551 Pain in right hip: Secondary | ICD-10-CM

## 2023-01-14 NOTE — Therapy (Signed)
OUTPATIENT PHYSICAL THERAPY LOWER EXTREMITY Discharge/Treatment      Patient Name: Erik Odonnell MRN: [id number] DOB:1946/08/04, 77 y.o., male Today's Date: 01/14/2023 PHYSICAL THERAPY DISCHARGE SUMMARY  Visits from Start of Care: 8  Current functional level related to goals / functional outcomes: All goals met    Remaining deficits: Strength    Education / Equipment: HEP   Patient agrees to discharge. Patient goals were met. Patient is being discharged due to meeting the stated rehab goals.  END OF SESSION:  PT End of Session - 01/14/23 1420     Visit Number 8    Number of Visits 8    Date for PT Re-Evaluation 01/20/23    Authorization Type humana    Authorization Time Period 12 visits approved and 1 re eval from 12/13/22 - 01/20/23    Authorization - Visit Number 8    Authorization - Number of Visits 12    Progress Note Due on Visit 8    PT Start Time [id number]    PT Stop Time 1420    PT Time Calculation (min) 32 min                     Past Medical History:  Diagnosis Date   Arthritis    Diverticulosis    Gout    Hemifacial spasm    left side affected takes Artane daily;    History of bronchitis 11/2013   Hypertension    takes Ziac daily   Joint pain    Past Surgical History:  Procedure Laterality Date   CATARACT EXTRACTION, BILATERAL Bilateral 2015, 2016   COLONOSCOPY     COLONOSCOPY N/A 12/18/2017   diverticulosis and internal hemorhoids.   COLONOSCOPY WITH PROPOFOL N/A 08/07/2022   Procedure: COLONOSCOPY WITH PROPOFOL;  Surgeon: Eloise Harman, DO;  [facility];  Service: Endoscopy;  Laterality: N/A;  11:00 am   eye muschle surgeries  as a child   x 5   HERNIA REPAIR Left    as a child-inguinal;right inguinal in 1969   POLYPECTOMY  08/07/2022   Procedure: POLYPECTOMY;  Surgeon: Eloise Harman, DO;  [facility];  Service: Endoscopy;;   TOTAL HIP ARTHROPLASTY Right 03/24/2014   Procedure: TOTAL HIP ARTHROPLASTY  ANTERIOR APPROACH;  Surgeon: Ninetta Lights, MD;  Location: [facility];  Service: Orthopedics;  Laterality: Right;   TOTAL HIP ARTHROPLASTY Left 08/26/2018   Procedure: LEFT TOTAL HIP ARTHROPLASTY ANTERIOR APPROACH;  Surgeon: Renette Butters, MD;  Location: [facility];  Service: Orthopedics;  Laterality: Left;   Patient Active Problem List   Diagnosis Date Noted   Hemorrhoids 09/11/2022   Rectal bleeding 07/11/2022   Hemifacial spasm of left side of face 05/18/2022   Primary osteoarthritis of hip 08/26/2018   Primary osteoarthritis of left hip 08/04/2018   Special screening for malignant neoplasms, colon 09/11/2017   Proximal leg weakness 04/07/2014   Difficulty in walking(719.7) 04/07/2014   Impairment of balance 04/07/2014   Primary localized osteoarthrosis, pelvic region and thigh 03/24/2014    PCP: [facility]  REFERRING PROVIDER: Renette Butters, MD  REFERRING DIAG:  M70.61 (ICD-10-CM) - Greater trochanteric bursitis, right    THERAPY DIAG:  Rt hip pain Muscle weakness  Rationale for Evaluation and Treatment: Rehabilitation  ONSET DATE: 10/10/2022     SUBJECTIVE STATEMENT: Pt states that he split wood for a couple of hours this morning. States that his pain is very low now    PERTINENT  HISTORY: Remote B THR with anterior approach  PAIN:  Are you having pain? 0pain now; worst pain in the past week  PRECAUTIONS: None  WEIGHT BEARING RESTRICTIONS: No  FALLS:  Has patient fallen in last 6 months? No  LIVING ENVIRONMENT: Lives with: lives with their family Lives in: House/apartment Stairs: Yes: External: 6 steps; on right going up:  able to go reciprocal; increased pain if he was hurting.  Has following equipment at home:  has walker/cane at home from [facility] but no longer uses   PATIENT GOALS: less hip pain   NEXT MD VISIT: not sure   OBJECTIVE:   DIAGNOSTIC FINDINGS: Wellsite geologist took x ray and states that his THR was in great position.  PATIENT  SURVEYS:  FOTO 61   MUSCLE LENGTH: Hamstrings: Right 125 deg; Left 135 deg 3/25:  POSTURE: rounded shoulders, forward head, decreased lumbar lordosis, and increased thoracic kyphosis  LOWER EXTREMITY MMT:  MMT Right eval 3/21 Left eval 3/21  Hip flexion 3/5 4/5 4/5 4+/5  Hip extension 3+/5 4/4 4/5 4/5  Hip abduction 3+/5  4+5 5/5 5/5  Hip adduction      Hip internal rotation      Hip external rotation      Knee flexion 5  5   Knee extension 5  5   Ankle dorsiflexion 5  5   Ankle plantarflexion      Ankle inversion      Ankle eversion       (Blank rows = not tested)   FUNCTIONAL TESTS:  30 seconds chair stand test :  13 average for age and sex : 3/15: 15  which is good for age and sex ; 3/25 14: 2 minute walk test: 400 ft with decrease ankle motion B noted leaning to LT;3/15  less leaning and walked 448 ft.  Single leg stance: RT: 0   ; LT   1;  3/15:  both 2 seconds ; 3/25 RT: 10" , LT 5    TODAY'S TREATMENT:                                                                                                                              DATE:  01/14/23 Wall arch x 10 Tandem stance x 3 w/RT in front then x 3 lt in front  Single leg stance 5 x B, LT: 5"max ; RT 10" ITband stretch at wall x 2 B for 30" each.   Sit to stand x 20  Bridge x 20 Active hamstring stretch 3 x 30" 01/10/23 Nustep for warm up x 5 minutes hills 3 level 3  ITB stretch 3 x 30" Tandem stance 5x  Side step with green tband x 2 Rt Functional squat with green t-band around thighs x 10  Prone: hip extension x 10 B  Quad stretch B 2 x 30" Supine knee to chest stretch 30" x 2  Piriformis stretch B 30" B SLR  x 10 B Bridge with resisted hip abduction with green theraband x 10 hold 5"  01/08/23 Step up 7 inch 2 x 10  bilateral  Lateral step up 7 inch 2 x 10 bilateral  Lateral step down 7 inch 1 x 10 bilateral  Tandem stance 3 x 30 second holds bilateral  SLS 3 x 30 second holds bilateral with frequent  HHA Retro walking with 4 pl at body craft x 10 Side stepping with 3 pl at body craft x 5 bilateral    01/04/23 Standing: Side stepping with 5 pl at body craft x 3 Retro walking with 4 pl at body craft x 3 Leg press 4# x 15  Sitting: Sit to stand x 16  Stand : Single leg stance: x 5 reps each Slant board stretch 30" x 3  Tandem stance B  Hip abduction green theraband x 20 Hip extension green theraband x 20   12/31/22  Sidelying hip abduction 2 x 10 bilateral  Prone hip extension 2 x 10 bilateral 5 second holds Standing hip abduction GTB at knees 2 x 10  Standing hip extension GTB at knees 2 x 10  Step up 6 inch 2 x 10 bilateral  Lateral stepping 8 x 10 feet bilateral  Tandem stance 2 x 30 seconds bilateral  STS 2 x 10   12/26/22 Standing: Wall arch x 10 Heel raise x 10 Squat x 10  Sitting:  Thoracic excursion x 3 Supine: Active hamstring stretch x 3  Knee to chest x 3  Bridge x 15  SLR B x 10 reps Side lying hip abduction x 10 B    PATIENT EDUCATION:  Education details: HEP Person educated: Patient Education method: Explanation and Handouts Education comprehension: returned demonstration  HOME EXERCISE PROGRAM: Access Code: [email or web address] 12/31/22- Standing Hip Abduction with Resistance at Ankles and Counter Support  - 1 x daily - 7 x weekly - 2 sets - 10 reps  12/26/22 - Sit to Stand Without Arm Support  - 1 x daily - 7 x weekly - 1 sets - 10 reps - 3-5: hold - Heel Raises with Counter Support  - 1 x daily - 7 x weekly - 1 sets - 10 reps - 3-5" hold - Mini Squat  - 1 x daily - 7 x weekly - 1 sets - 10 reps - 3-5" hold URL: [email or web address] 12/[email or web address]/22 - Standing Hip Abduction with Counter Support  - 1 x daily - 7 x weekly - 2 sets - 10 reps  Date: 12/13/2022 - Supine Bridge  - 1 x daily - 7 x weekly - 1 sets - 10 reps - 3-5" hold - Supine Active Straight Leg Raise  - 1 x daily - 7 x weekly - 1 sets - 10 reps - 3-5 hold - Sidelying Hip Abduction  - 1  x daily - 7 x weekly - 1 sets - 10 reps - 3-5 hold - Supine Hamstring Stretch  - 1 x daily - 7 x weekly - 1 sets - 10 reps - 3-5 hold - Supine Single Knee to Chest Stretch  - 1 x daily - 7 x weekly - 1 sets - 10 reps - 3-5 hold  ASSESSMENT:  CLINICAL IMPRESSION:  PT has no pain today.  ITBand stretch added to HEP.  Pt has met all his goals and feels comfortable being discharge to a HEP.   OBJECTIVE IMPAIRMENTS: decreased activity tolerance, decreased balance, difficulty walking, decreased strength, and pain.   ACTIVITY LIMITATIONS:  standing and locomotion level  PARTICIPATION LIMITATIONS: shopping and community activity   REHAB POTENTIAL: Good  CLINICAL DECISION MAKING: Stable/uncomplicated  EVALUATION COMPLEXITY: Low   GOALS: Goals reviewed with patient? No  SHORT TERM GOALS: Target date: 12/27/2022 PT to be I in HEP to have no pain after physical activity  Baseline: Goal status: MET  2.  Pt Rt LE muscle strength to increase 1/2 grade to be able to ambulate with equal wt bearing on LE  Baseline:  Goal status: MET   LONG TERM GOALS: Target date: 01/10/2023  PT to be I in an advanced HEP to improve hamstring flexiblity B by 10 degrees. Baseline:  Goal status: MET  2.   Pt Rt LE muscle strength to increase 1 grade to be able to be able to go up and down 12 steps in a reciprocal manner without difficulty Baseline:  Goal status: MET  3.  PT to be able to single leg stance on both LE for 5-10" to decrease falls  Baseline:  Goal status: MET   PLAN:  PT FREQUENCY: 2x/week  PT DURATION: 6 weeks  PLANNED INTERVENTIONS: Therapeutic exercises, Therapeutic activity, Balance training, Gait training, Patient/Family education, Self Care, and Manual therapy  PLAN FOR NEXT SESSION: Work on steps.  Continue with stretching and strengthening to decrease pain and improve mobility.   Rayetta Humphrey, [place] CLT [phone]

## 2023-01-17 ENCOUNTER — Encounter (HOSPITAL_COMMUNITY): Payer: Medicare PPO | Admitting: Physical Therapy

## 2023-01-21 DIAGNOSIS — E6609 Other obesity due to excess calories: Secondary | ICD-10-CM | POA: Diagnosis not present

## 2023-01-21 DIAGNOSIS — D518 Other vitamin B12 deficiency anemias: Secondary | ICD-10-CM | POA: Diagnosis not present

## 2023-01-21 DIAGNOSIS — Z0001 Encounter for general adult medical examination with abnormal findings: Secondary | ICD-10-CM | POA: Diagnosis not present

## 2023-01-21 DIAGNOSIS — Z6832 Body mass index (BMI) 32.0-32.9, adult: Secondary | ICD-10-CM | POA: Diagnosis not present

## 2023-01-21 DIAGNOSIS — G319 Degenerative disease of nervous system, unspecified: Secondary | ICD-10-CM | POA: Diagnosis not present

## 2023-01-21 DIAGNOSIS — G9332 Myalgic encephalomyelitis/chronic fatigue syndrome: Secondary | ICD-10-CM | POA: Diagnosis not present

## 2023-01-21 DIAGNOSIS — E1140 Type 2 diabetes mellitus with diabetic neuropathy, unspecified: Secondary | ICD-10-CM | POA: Diagnosis not present

## 2023-01-21 DIAGNOSIS — M150 Primary generalized (osteo)arthritis: Secondary | ICD-10-CM | POA: Diagnosis not present

## 2023-01-21 DIAGNOSIS — Z1331 Encounter for screening for depression: Secondary | ICD-10-CM | POA: Diagnosis not present

## 2023-01-21 DIAGNOSIS — I10 Essential (primary) hypertension: Secondary | ICD-10-CM | POA: Diagnosis not present

## 2023-01-21 DIAGNOSIS — E559 Vitamin D deficiency, unspecified: Secondary | ICD-10-CM | POA: Diagnosis not present

## 2023-01-21 DIAGNOSIS — K219 Gastro-esophageal reflux disease without esophagitis: Secondary | ICD-10-CM | POA: Diagnosis not present

## 2023-01-21 DIAGNOSIS — Z125 Encounter for screening for malignant neoplasm of prostate: Secondary | ICD-10-CM | POA: Diagnosis not present

## 2023-02-11 ENCOUNTER — Telehealth: Payer: Self-pay | Admitting: Neurology

## 2023-02-11 NOTE — Telephone Encounter (Addendum)
Pt called and LVM stating that he is needing to cx his appt on 04/24 for his BOTOX. There is nothing available soon to r/s pt. Can pt see NP ? Please advise.

## 2023-02-12 NOTE — Telephone Encounter (Signed)
FYI- pt not interested in rescheduling.

## 2023-02-13 ENCOUNTER — Ambulatory Visit: Payer: Medicare PPO | Admitting: Neurology

## 2023-02-20 ENCOUNTER — Ambulatory Visit: Payer: Medicare PPO | Admitting: Neurology

## 2023-02-28 DIAGNOSIS — L570 Actinic keratosis: Secondary | ICD-10-CM | POA: Diagnosis not present

## 2023-02-28 DIAGNOSIS — L298 Other pruritus: Secondary | ICD-10-CM | POA: Diagnosis not present

## 2023-02-28 DIAGNOSIS — X32XXXD Exposure to sunlight, subsequent encounter: Secondary | ICD-10-CM | POA: Diagnosis not present

## 2023-05-08 ENCOUNTER — Ambulatory Visit: Payer: Medicare PPO | Admitting: Neurology

## 2023-05-08 ENCOUNTER — Encounter: Payer: Self-pay | Admitting: Neurology

## 2023-05-08 VITALS — BP 141/83 | HR 53 | Ht 68.0 in | Wt 217.0 lb

## 2023-05-08 DIAGNOSIS — R4789 Other speech disturbances: Secondary | ICD-10-CM

## 2023-05-08 DIAGNOSIS — G3184 Mild cognitive impairment, so stated: Secondary | ICD-10-CM | POA: Diagnosis not present

## 2023-05-08 DIAGNOSIS — G5132 Clonic hemifacial spasm, left: Secondary | ICD-10-CM | POA: Diagnosis not present

## 2023-05-08 NOTE — Patient Instructions (Signed)
 Continue current medications  Follow up with PCP  Return in a year or sooner if worse

## 2023-05-08 NOTE — Progress Notes (Signed)
[facility]  PATIENT: Erik Odonnell DOB: 06-14-1946  REQUESTING CLINICIAN: Elfredia Nevins, MD HISTORY FROM: Patient and spouse  REASON FOR VISIT: Blepharospasm    HISTORICAL  CHIEF COMPLAINT:  Chief Complaint  Patient presents with   Follow-up    Rm13, wife present  Mild cognitive impairment, Word finding difficulty, moca: 15        INTERVAL HISTORY 05/08/2023: Patient presents today for follow-up, he is accompanied by wife.  Last visit was a year ago and since then he reports that he has been doing well.  He completed his Botox injection, and feels like his memory is normal and has not gotten worse.  Wife feels that his main problem is his hearing loss.  He does have hearing loss but has not been evaluated with audiogram and he is not using hearing aid.  She feel like sometimes she has to repeat herself due to patient not hearing her.  He does have word finding difficulty but he still independent in all activities of daily living.   INTERVAL HISTORY 05/07/22: Patient presents today for follow-up, at last visit in May, plan was to start Botox injection for his left hemifacial spasm.  He is scheduled for the first botox injection on July 26.  Today she is presenting with wife due to memory problem.   Patient reports that his memory problems and confusion started while being diagnosed with COVID in 2021.  Since then he has problems remembering, but his main problem is word finding difficulty.  Wife reported he is repetitive, she has to remind him.  And mainly he has problem getting the words out.  Patient also report episode of confusion.  He states yesterday he changed the car motor oil but he was not sure if he did it right so he had to go back and look over his steps over again twice.  He reports changing car motor oils for more than 20 years without any difficulty.  He has trouble falling asleep, feels sleepy during the daytime and currently, he is being  evaluated for sleep apnea.    HISTORY OF PRESENT ILLNESS:  This is a 77 year old [place] vet who is presenting with complaint of blepharospasm.  Patient reports a long history of eye disease.  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  He also report bilateral cataract surgery. Starting in 1990 he was diagnosed with left blepharospasm.  He was treated with Botox by Dr. Gerilyn Pilgrim.  Patient reported last Botox treatment was 4 years ago.  Since then he has been having increased blepharospasm, increase left hemifacial spasm sometimes involving the cheek and the left upper lip.  This affects his day-to-day activity and mostly when driving.  He is interested in receiving Botox treatment again.   OTHER MEDICAL CONDITIONS: Hypothyroidism, hypotension, gout   REVIEW OF SYSTEMS: Full 14 system review of systems performed and negative with exception of:  as noted in the HPI   ALLERGIES: No Known Allergies  HOME MEDICATIONS: Outpatient Medications Prior to Visit  Medication Sig Dispense Refill   allopurinol (ZYLOPRIM) 300 MG tablet Take 300 mg by mouth daily.     ALPRAZolam (XANAX) 0.5 MG tablet Take 0.25-0.5 mg by mouth at bedtime as needed for sleep.     bisoprolol-hydrochlorothiazide (ZIAC) 2.5-6.25 MG per tablet Take 1 tablet by mouth daily.     hydrocortisone (ANUSOL-HC) 2.5 % rectal cream Place 1 Application  rectally 2 (two) times daily. As needed for rectal bleeding 30 g 1   ketoconazole (NIZORAL) 2 % cream Apply 1 application topically daily as needed (foot rash).     levothyroxine (SYNTHROID) 50 MCG tablet Take 50 mcg by mouth daily before breakfast.  10   Multiple Vitamin (MULTIVITAMIN) capsule Take 1 capsule by mouth daily.     Chlorpheniramine Maleate (CHLORPHEN MALEATE PO) Take 4 mg by mouth daily as needed (allergies).     Facility-Administered Medications Prior to Visit  Medication Dose Route Frequency Provider Last  Rate Last Admin   botulinum toxin Type A (BOTOX) injection 100 Units  100 Units Intramuscular Once Levert Feinstein, MD        PAST MEDICAL HISTORY: Past Medical History:  Diagnosis Date   Arthritis    Diverticulosis    Gout    Hemifacial spasm    left side affected takes Artane daily;    History of bronchitis 11/2013   Hypertension    takes Ziac daily   Joint pain    Memory loss     PAST SURGICAL HISTORY: Past Surgical History:  Procedure Laterality Date   CATARACT EXTRACTION, BILATERAL Bilateral 2015, 2016   COLONOSCOPY     COLONOSCOPY N/A 12/18/2017   diverticulosis and internal hemorhoids.   COLONOSCOPY WITH PROPOFOL N/A 08/07/2022   Procedure: COLONOSCOPY WITH PROPOFOL;  Surgeon: Lanelle Bal, DO;  [facility];  Service: Endoscopy;  Laterality: N/A;  11:00 am   eye muschle surgeries  as a child   x 5   HERNIA REPAIR Left    as a child-inguinal;right inguinal in 1969   POLYPECTOMY  08/07/2022   Procedure: POLYPECTOMY;  Surgeon: Lanelle Bal, DO;  [facility]: [facility];  Service: Endoscopy;;   TOTAL HIP ARTHROPLASTY Right 03/24/2014   Procedure: TOTAL HIP ARTHROPLASTY ANTERIOR APPROACH;  Surgeon: Loreta Ave, MD;  Location: [facility] OR;  Service: Orthopedics;  Laterality: Right;   TOTAL HIP ARTHROPLASTY Left 08/26/2018   Procedure: LEFT TOTAL HIP ARTHROPLASTY ANTERIOR APPROACH;  Surgeon: Sheral Apley, MD;  Location: [facility];  Service: Orthopedics;  Laterality: Left;    FAMILY HISTORY: Family History  Problem Relation Age of Onset   Colon cancer Neg Hx    Colon polyps Neg Hx     SOCIAL HISTORY: Social History   Socioeconomic History   Marital status: Married    Spouse name: Not on file   Number of children: 2   Years of education: Not on file   Highest education level: Bachelor's degree (e.g., BA, AB, BS)  Occupational History   Not on file  Tobacco Use   Smoking status: Never   Smokeless tobacco: Never  Vaping Use   Vaping status:  Never Used  Substance and Sexual Activity   Alcohol use: Yes    Alcohol/week: 7.0 standard drinks of alcohol    Types: 7 Glasses of wine per week   Drug use: No   Sexual activity: Yes    Birth control/protection: None  Other Topics Concern   Not on file  Social History Narrative   Not on file   Social Determinants of Health   Financial Resource Strain: Not on file  Food Insecurity: Not on file  Transportation Needs: Not on file  Physical Activity: Not on file  Stress: Not on file  Social Connections: Not on file  Intimate Partner Violence: Not on file    PHYSICAL EXAM  GENERAL EXAM/CONSTITUTIONAL: Vitals:  Vitals:   05/08/23  1046 05/08/23 1101  BP: (!) 144/85 (!) 141/83  Pulse: (!) 53   Weight: 217 lb (98.4 kg)   Height: 5\' 8"  (1.727 m)    Body mass index is 32.99 kg/m. Wt Readings from Last 3 Encounters:  05/08/23 217 lb (98.4 kg)  11/21/22 214 lb (97.1 kg)  09/11/22 214 lb 9.6 oz (97.3 kg)   Patient is in no distress; well developed, nourished and groomed; neck is supple  MUSCULOSKELETAL: Gait, strength, tone, movements noted in Neurologic exam below  NEUROLOGIC: MENTAL STATUS:      No data to display              05/08/2023   10:51 AM 05/07/2022    3:02 PM  Montreal Cognitive Assessment   Visuospatial/ Executive (0/5) 1 5  Naming (0/3) 3 3  Attention: Read list of digits (0/2) 1 0  Attention: Read list of letters (0/1) 1 1  Attention: Serial 7 subtraction starting at 100 (0/3) 2 2  Language: Repeat phrase (0/2) 0 0  Language : Fluency (0/1) 1 1  Abstraction (0/2) 2 2  Delayed Recall (0/5) 0 0  Orientation (0/6) 4 6  Total 15 20  Adjusted Score (based on education) 15      CRANIAL NERVE:  2nd, 3rd, 4th, 6th - visual fields full to confrontation, extraocular muscles intact, no nystagmus 5th - facial sensation symmetric 7th - left hemifacial spasms noted, left eye mainly closed shut during the interview   8th - hearing intact 9th - palate  elevates symmetrically, uvula midline 11th - shoulder shrug symmetric 12th - tongue protrusion midline  MOTOR:  normal bulk and tone, full strength in the BUE, BLE  SENSORY:  normal and symmetric to light touch, pinprick, temperature, vibration  COORDINATION:  finger-nose-finger, fine finger movements normal  GAIT/STATION:  normal    DIAGNOSTIC DATA (LABS, IMAGING, TESTING) - I reviewed patient records, labs, notes, testing and imaging myself where available.  Lab Results  Component Value Date   WBC 6.9 08/18/2018   HGB 16.3 08/18/2018   HCT 48.8 08/18/2018   MCV 99.0 08/18/2018   PLT 161 08/18/2018      Component Value Date/Time   NA 135 08/18/2018 1148   K 4.2 08/18/2018 1148   CL 100 08/18/2018 1148   CO2 26 08/18/2018 1148   GLUCOSE 107 (H) 08/18/2018 1148   BUN 29 (H) 08/18/2018 1148   CREATININE 1.23 08/18/2018 1148   CALCIUM 9.1 08/18/2018 1148   PROT 7.5 03/17/2014 1223   ALBUMIN 4.1 03/17/2014 1223   AST 24 03/17/2014 1223   ALT 25 03/17/2014 1223   ALKPHOS 64 03/17/2014 1223   BILITOT 0.5 03/17/2014 1223   GFRNONAA 56 (L) 08/18/2018 1148   GFRAA >60 08/18/2018 1148   No results found for: "CHOL", "HDL", "LDLCALC", "LDLDIRECT", "TRIG", "CHOLHDL" No results found for: "HGBA1C" No results found for: "VITAMINB12" No results found for: "TSH"   MRI Brain 05/24/2022 1. No evidence of acute intracranial abnormality. 2. Moderate scattered T2/FLAIR hyperintensities within the white matter, nonspecific but most likely secondary to chronic microvascular ischemic disease. Chronic demyelination is a differential consideration, but thought less likely given patient age. 3.  Cerebral Atrophy (ICD10-G31.9).   ASSESSMENT AND PLAN  77 y.o. year old male with history of gout, hypothyroidism, left hemifacial spasm who is presenting for follow up for his mild cognitive impairment.  He feels that his memory is stable even though wife reports that she has  to repeat  herself  but feels this is due to patient hearing loss.  On exam today he scored a 15 out of 30 on the Moca which is worse than his previous MoCA score of 20.  He is still independent all actives of daily living.  We discussed medication such as Aricept and patient would like to defer, His heart rate is also noted to be 53.  Plan for now is for patient to have an audiology evaluation, and I will see him in 1 year for follow-up.  Please continue follow-up with your PCP and continue your medications.    1. Mild cognitive impairment   2. Hemifacial spasm of left side of face   3. Word finding difficulty      Patient Instructions  Continue current medications  Follow up with PCP  Return in a year or sooner if worse   No orders of the defined types were placed in this encounter.   No orders of the defined types were placed in this encounter.   Return in about 1 year (around 05/07/2024).  I have spent a total of 40 minutes dedicated to this patient today, preparing to see patient, performing a medically appropriate examination and evaluation, ordering tests and/or medications and procedures, and counseling and educating the patient/family/caregiver; independently interpreting result and communicating results to the family/patient/caregiver; and documenting clinical information in the electronic medical record.   Windell Norfolk, MD 05/08/2023, 4:45 PM  [facility] [address], [address] [place], [place] [address] [phone]

## 2023-07-11 ENCOUNTER — Other Ambulatory Visit (HOSPITAL_COMMUNITY): Payer: Self-pay | Admitting: Internal Medicine

## 2023-07-11 DIAGNOSIS — I10 Essential (primary) hypertension: Secondary | ICD-10-CM | POA: Diagnosis not present

## 2023-07-11 DIAGNOSIS — E6609 Other obesity due to excess calories: Secondary | ICD-10-CM | POA: Diagnosis not present

## 2023-07-11 DIAGNOSIS — Z6833 Body mass index (BMI) 33.0-33.9, adult: Secondary | ICD-10-CM | POA: Diagnosis not present

## 2023-07-11 DIAGNOSIS — E1140 Type 2 diabetes mellitus with diabetic neuropathy, unspecified: Secondary | ICD-10-CM | POA: Diagnosis not present

## 2023-07-11 DIAGNOSIS — G319 Degenerative disease of nervous system, unspecified: Secondary | ICD-10-CM | POA: Diagnosis not present

## 2023-07-11 DIAGNOSIS — R310 Gross hematuria: Secondary | ICD-10-CM | POA: Diagnosis not present

## 2023-07-11 DIAGNOSIS — M1991 Primary osteoarthritis, unspecified site: Secondary | ICD-10-CM | POA: Diagnosis not present

## 2023-07-11 DIAGNOSIS — K219 Gastro-esophageal reflux disease without esophagitis: Secondary | ICD-10-CM | POA: Diagnosis not present

## 2023-07-29 NOTE — Progress Notes (Signed)
Name: Erik Odonnell DOB: [birth year] MRN: [id number]  History of Present Illness: Erik Odonnell is a 77 y.o. male who presents today as a new patient at [facility] [place]. He is accompanied by his wife Myra.  He reports chief complaint of hematuria.  He reports that about 3 weeks ago he had blood clots in his urine twice; denies gross hematuria. Around the same time he noticed bloody semen 1 or 2 times. Those symptoms have since resolved.  - 01/22/2023: PSA 1.4, creatinine 1.36, GFR 53. - He is scheduled for CT abdomen/pelvis w/ contrast on 08/20/2023 as ordered by his PCP (Dr. Sherwood Gambler).  Today He denies increased urinary urgency, frequency, nocturia, dysuria, gross hematuria, hesitancy, straining to void, or sensations of incomplete emptying. He denies abdominal or flank pain.  He denies prior history of gross hematuria.  He denies history of kidney stones.  He denies history of pyelonephritis.  He denies history of recent or recurrent UTI. He denies history of GU malignancy or pelvic radiation.  He denies history of autoimmune disease. He denies history of smoking. He was exposed to [employer] during his time in the [employer]. He denies recent vigorous exercise which they think may be contributory to hematuria. He denies any recent trauma or prolonged pressure to the perineal area. He denies recent illness. He denies taking anticoagulants.  Fall Screening: Do you usually have a device to assist in your mobility? No   Medications: Current Outpatient Medications  Medication Sig Dispense Refill   allopurinol (ZYLOPRIM) 300 MG tablet Take 300 mg by mouth daily.     ALPRAZolam (XANAX) 0.5 MG tablet Take 0.25-0.5 mg by mouth at bedtime as needed for sleep.     bisoprolol-hydrochlorothiazide (ZIAC) 2.5-6.25 MG per tablet Take 1 tablet by mouth daily.     hydrocortisone (ANUSOL-HC) 2.5 % rectal cream Place 1 Application rectally 2 (two) times daily. As needed for rectal bleeding  30 g 1   ketoconazole (NIZORAL) 2 % cream Apply 1 application topically daily as needed (foot rash).     levothyroxine (SYNTHROID) 50 MCG tablet Take 50 mcg by mouth daily before breakfast.  10   Multiple Vitamin (MULTIVITAMIN) capsule Take 1 capsule by mouth daily.     Current Facility-Administered Medications  Medication Dose Route Frequency Provider Last Rate Last Admin   botulinum toxin Type A (BOTOX) injection 100 Units  100 Units Intramuscular Once Levert Feinstein, MD        Allergies: No Known Allergies  Past Medical History:  Diagnosis Date   Arthritis    Diverticulosis    Gout    Hemifacial spasm    left side affected takes Artane daily;    History of bronchitis 11/2013   Hypertension    takes Ziac daily   Joint pain    Memory loss    Past Surgical History:  Procedure Laterality Date   CATARACT EXTRACTION, BILATERAL Bilateral 2015, 2016   COLONOSCOPY     COLONOSCOPY N/A 12/18/2017   diverticulosis and internal hemorhoids.   COLONOSCOPY WITH PROPOFOL N/A 08/07/2022   Procedure: COLONOSCOPY WITH PROPOFOL;  Surgeon: Lanelle Bal, DO;  [facility];  Service: Endoscopy;  Laterality: N/A;  11:00 am   eye muschle surgeries  as a child   x 5   HERNIA REPAIR Left    as a child-inguinal;right inguinal in 1969   POLYPECTOMY  08/07/2022   Procedure: POLYPECTOMY;  Surgeon: Lanelle Bal, DO;  [facility]: [facility];  Service: Endoscopy;;   TOTAL HIP ARTHROPLASTY Right 03/24/2014   Procedure: TOTAL HIP ARTHROPLASTY ANTERIOR APPROACH;  Surgeon: Loreta Ave, MD;  Location: [facility] OR;  Service: Orthopedics;  Laterality: Right;   TOTAL HIP ARTHROPLASTY Left 08/26/2018   Procedure: LEFT TOTAL HIP ARTHROPLASTY ANTERIOR APPROACH;  Surgeon: Sheral Apley, MD;  Location: [facility];  Service: Orthopedics;  Laterality: Left;   Family History  Problem Relation Age of Onset   Colon cancer Neg Hx    Colon polyps Neg Hx    Social History   Socioeconomic History    Marital status: Married    Spouse name: Not on file   Number of children: 2   Years of education: Not on file   Highest education level: Bachelor's degree (e.g., BA, AB, BS)  Occupational History   Not on file  Tobacco Use   Smoking status: Never   Smokeless tobacco: Never  Vaping Use   Vaping status: Never Used  Substance and Sexual Activity   Alcohol use: Yes    Alcohol/week: 7.0 standard drinks of alcohol    Types: 7 Glasses of wine per week   Drug use: No   Sexual activity: Yes    Birth control/protection: None  Other Topics Concern   Not on file  Social History Narrative   Not on file   Social Determinants of Health   Financial Resource Strain: Not on file  Food Insecurity: Not on file  Transportation Needs: Not on file  Physical Activity: Not on file  Stress: Not on file  Social Connections: Not on file  Intimate Partner Violence: Not on file    SUBJECTIVE  Review of Systems Constitutional: Patient denies any unintentional weight loss or change in strength lntegumentary: Patient denies any rashes or pruritus Cardiovascular: Patient denies chest pain or syncope Respiratory: Patient denies shortness of breath Gastrointestinal: Patient denies nausea, vomiting, constipation, or diarrhea Musculoskeletal: Patient denies muscle cramps or weakness Neurologic: Patient denies convulsions or seizures Allergic/Immunologic: Patient denies recent allergic reaction(s) Hematologic/Lymphatic: Patient denies bleeding tendencies Endocrine: Patient denies heat/cold intolerance  GU: As per HPI.  OBJECTIVE Vitals:   08/02/23 0834  BP: (!) 143/89  Pulse: 63  Temp: 98 F (36.7 C)   There is no height or weight on file to calculate BMI.  Physical Examination Constitutional: No obvious distress; patient is non-toxic appearing  Cardiovascular: No visible lower extremity edema.  Respiratory: The patient does not have audible wheezing/stridor; respirations do not appear  labored  Gastrointestinal: Abdomen non-distended Musculoskeletal: Normal ROM of UEs  Skin: No obvious rashes/open sores  Neurologic: CN 2-12 grossly intact Psychiatric: Answered questions appropriately with normal affect  Hematologic/Lymphatic/Immunologic: No obvious bruises or sites of spontaneous bleeding  UA: negative  PVR: 121 ml  ASSESSMENT Hematuria, unspecified type - Plan: BLADDER SCAN AMB NON-IMAGING, Urinalysis, Routine w reflex microscopic, PSA  Hematospermia - Plan: PSA  We discussed possible etiologies for recent episode(s) of gross hematuria and hematospermia including but not limited to: sexual activity, stone, infection, kidney function, BPH, malignancy. Will recheck PSA today per patient request and advised cystoscopy for further evaluation. Advised to proceed with CT abdomen/pelvis w/ contrast as scheduled on 08/20/2023 as ordered by his PCP (Dr. Sherwood Gambler).  Pt verbalized understanding and decided to pursue this work-up. Patient agreed to follow-up afterward to discuss the results and formulate a treatment plan based on the findings. All questions were answered.   PLAN Advised the following: CT. PSA. 3. Return for 1st available cystoscopy with any urology  MD.  Orders Placed This Encounter  Procedures   Urinalysis, Routine w reflex microscopic   PSA   BLADDER SCAN AMB NON-IMAGING    It has been explained that the patient is to follow regularly with their PCP in addition to all other providers involved in their care and to follow instructions provided by these respective offices. Patient advised to contact urology clinic if any urologic-pertaining questions, concerns, new symptoms or problems arise in the interim period.  There are no Patient Instructions on file for this visit.  Electronically signed by:  Donnita Falls, MSN, FNP-C, CUNP 08/02/2023 9:58 AM

## 2023-08-02 ENCOUNTER — Encounter: Payer: Self-pay | Admitting: Urology

## 2023-08-02 ENCOUNTER — Ambulatory Visit: Payer: Medicare PPO | Admitting: Urology

## 2023-08-02 VITALS — BP 143/89 | HR 63 | Temp 98.0°F

## 2023-08-02 DIAGNOSIS — R319 Hematuria, unspecified: Secondary | ICD-10-CM

## 2023-08-02 DIAGNOSIS — R361 Hematospermia: Secondary | ICD-10-CM | POA: Diagnosis not present

## 2023-08-02 DIAGNOSIS — R300 Dysuria: Secondary | ICD-10-CM | POA: Diagnosis not present

## 2023-08-02 LAB — URINALYSIS, ROUTINE W REFLEX MICROSCOPIC
Bilirubin, UA: NEGATIVE
Glucose, UA: NEGATIVE
Ketones, UA: NEGATIVE
Leukocytes,UA: NEGATIVE
Nitrite, UA: NEGATIVE
Protein,UA: NEGATIVE
RBC, UA: NEGATIVE
Specific Gravity, UA: 1.01 (ref 1.005–1.030)
Urobilinogen, Ur: 0.2 mg/dL (ref 0.2–1.0)
pH, UA: 6.5 (ref 5.0–7.5)

## 2023-08-02 LAB — BLADDER SCAN AMB NON-IMAGING: Scan Result: 121

## 2023-08-03 LAB — PSA: Prostate Specific Ag, Serum: 1.3 ng/mL (ref 0.0–4.0)

## 2023-08-13 ENCOUNTER — Other Ambulatory Visit: Payer: Medicare PPO | Admitting: Urology

## 2023-08-20 ENCOUNTER — Encounter (HOSPITAL_COMMUNITY): Payer: Self-pay | Admitting: Radiology

## 2023-08-20 ENCOUNTER — Ambulatory Visit (HOSPITAL_COMMUNITY)
Admission: RE | Admit: 2023-08-20 | Discharge: 2023-08-20 | Disposition: A | Discharge status: Home / Self Care | Payer: Medicare PPO | Source: Ambulatory Visit | Attending: Internal Medicine | Admitting: Internal Medicine

## 2023-08-20 DIAGNOSIS — I723 Aneurysm of iliac artery: Secondary | ICD-10-CM | POA: Diagnosis not present

## 2023-08-20 DIAGNOSIS — N281 Cyst of kidney, acquired: Secondary | ICD-10-CM | POA: Diagnosis not present

## 2023-08-20 DIAGNOSIS — R310 Gross hematuria: Secondary | ICD-10-CM | POA: Insufficient documentation

## 2023-08-20 LAB — POCT I-STAT CREATININE: Creatinine, Ser: 1.4 mg/dL — ABNORMAL HIGH (ref 0.61–1.24)

## 2023-08-20 MED ORDER — IOHEXOL 300 MG/ML  SOLN
125.0000 mL | Freq: Once | INTRAMUSCULAR | Status: AC | PRN
  Administered 2023-08-20: 125 mL via INTRAVENOUS

## 2023-08-26 ENCOUNTER — Ambulatory Visit: Payer: Medicare PPO | Admitting: Urology

## 2023-08-26 DIAGNOSIS — R319 Hematuria, unspecified: Secondary | ICD-10-CM

## 2023-08-26 DIAGNOSIS — R361 Hematospermia: Secondary | ICD-10-CM

## 2023-08-26 LAB — URINALYSIS, ROUTINE W REFLEX MICROSCOPIC
Bilirubin, UA: NEGATIVE
Glucose, UA: NEGATIVE
Ketones, UA: NEGATIVE
Leukocytes,UA: NEGATIVE
Nitrite, UA: NEGATIVE
Protein,UA: NEGATIVE
Specific Gravity, UA: 1.01 (ref 1.005–1.030)
Urobilinogen, Ur: 0.2 mg/dL (ref 0.2–1.0)
pH, UA: 6.5 (ref 5.0–7.5)

## 2023-08-26 LAB — MICROSCOPIC EXAMINATION
Bacteria, UA: NONE SEEN
WBC, UA: NONE SEEN /[HPF] (ref 0–5)

## 2023-08-26 MED ORDER — CIPROFLOXACIN HCL 500 MG PO TABS
500.0000 mg | ORAL_TABLET | Freq: Once | ORAL | Status: AC
  Administered 2023-08-26: 500 mg via ORAL

## 2023-08-26 NOTE — Progress Notes (Unsigned)
   08/26/23  CC: No chief complaint on file.   HPI:  Erik Odonnell presents for cystoscopy.  He is well without dysuria or further gross hematuria.  He developed gross hematuria with clots in September 2024.  He also had hematospermia. His symptoms resolved.  His PSA was 1.4 in April 2024 with a creatinine of 1.36.  He underwent CT scan of the abdomen and pelvis August 20, 2023 which unofficially showed a 5.2 cm right lower pole cyst and was otherwise benign.  The prostate was obscured by bilateral hip replacement. Official read pending.   Blood pressure (!) 147/92, pulse 62. NED. A&Ox3.   No respiratory distress   Abd soft, NT, ND Normal phallus with bilateral descended testicles  Cystoscopy Procedure Note  Patient identification was confirmed, informed consent was obtained, and patient was prepped using Betadine solution.  Lidocaine jelly was administered per urethral meatus.  Given cipro.    Pre-Procedure: - Inspection reveals a normal caliber ureteral meatus.  Procedure: The flexible cystoscope was introduced without difficulty - No urethral strictures/lesions are present. - birderline obstructing prostate, no median lobe  - normal bladder neck - Bilateral ureteral orifices identified - Bladder mucosa  reveals no ulcers, tumors, or lesions - No bladder stones - No trabeculation  Retroflexion shows normal bladder    Post-Procedure: - Patient tolerated the procedure well  Assessment/ Plan:  Gross hematuria, hematospermia - benign eval. F/u 1 year or sooner if issues.   No follow-ups on file.  Jerilee Field, MD

## 2023-08-29 DIAGNOSIS — L570 Actinic keratosis: Secondary | ICD-10-CM | POA: Diagnosis not present

## 2023-08-29 DIAGNOSIS — X32XXXD Exposure to sunlight, subsequent encounter: Secondary | ICD-10-CM | POA: Diagnosis not present

## 2023-09-05 ENCOUNTER — Other Ambulatory Visit (HOSPITAL_COMMUNITY): Payer: Self-pay | Admitting: Internal Medicine

## 2023-09-05 DIAGNOSIS — R9389 Abnormal findings on diagnostic imaging of other specified body structures: Secondary | ICD-10-CM

## 2023-09-10 ENCOUNTER — Telehealth: Payer: Self-pay

## 2023-09-10 NOTE — Telephone Encounter (Signed)
Already fwd'd to MD, waiting for a response on how to proceed.

## 2023-09-10 NOTE — Telephone Encounter (Signed)
Patient's wife called for patient. Has blood in semen.    Please advise.  Call:  [phone]

## 2023-09-11 NOTE — Telephone Encounter (Signed)
Responded via mychart

## 2023-10-03 NOTE — Telephone Encounter (Signed)
I called patient and left a vm requesting a call back to follow up on previous mychart message and how patient would like to proceed at this time.

## 2023-10-04 ENCOUNTER — Ambulatory Visit (HOSPITAL_COMMUNITY)
Admission: RE | Admit: 2023-10-04 | Discharge: 2023-10-04 | Disposition: A | Discharge status: Home / Self Care | Payer: Medicare PPO | Source: Ambulatory Visit | Attending: Internal Medicine | Admitting: Internal Medicine

## 2023-10-04 DIAGNOSIS — I771 Stricture of artery: Secondary | ICD-10-CM | POA: Diagnosis not present

## 2023-10-04 DIAGNOSIS — I7121 Aneurysm of the ascending aorta, without rupture: Secondary | ICD-10-CM | POA: Diagnosis not present

## 2023-10-04 DIAGNOSIS — R9389 Abnormal findings on diagnostic imaging of other specified body structures: Secondary | ICD-10-CM | POA: Insufficient documentation

## 2023-10-04 DIAGNOSIS — I700 Atherosclerosis of aorta: Secondary | ICD-10-CM | POA: Diagnosis not present

## 2023-10-04 LAB — POCT I-STAT CREATININE: Creatinine, Ser: 1.4 mg/dL — ABNORMAL HIGH (ref 0.61–1.24)

## 2023-10-04 MED ORDER — IOHEXOL 300 MG/ML  SOLN
75.0000 mL | Freq: Once | INTRAMUSCULAR | Status: AC | PRN
  Administered 2023-10-04: 75 mL via INTRAVENOUS

## 2023-10-07 ENCOUNTER — Encounter: Payer: Self-pay | Admitting: Vascular Surgery

## 2023-10-07 DIAGNOSIS — E1140 Type 2 diabetes mellitus with diabetic neuropathy, unspecified: Secondary | ICD-10-CM | POA: Insufficient documentation

## 2023-10-07 DIAGNOSIS — G319 Degenerative disease of nervous system, unspecified: Secondary | ICD-10-CM

## 2023-10-07 DIAGNOSIS — I10 Essential (primary) hypertension: Secondary | ICD-10-CM

## 2023-10-07 DIAGNOSIS — K219 Gastro-esophageal reflux disease without esophagitis: Secondary | ICD-10-CM | POA: Insufficient documentation

## 2023-10-07 DIAGNOSIS — M1991 Primary osteoarthritis, unspecified site: Secondary | ICD-10-CM | POA: Insufficient documentation

## 2023-10-07 HISTORY — DX: Degenerative disease of nervous system, unspecified: G31.9

## 2023-10-07 HISTORY — DX: Primary osteoarthritis, unspecified site: M19.91

## 2023-10-07 HISTORY — DX: Essential (primary) hypertension: I10

## 2023-10-07 HISTORY — DX: Type 2 diabetes mellitus with diabetic neuropathy, unspecified: E11.40

## 2023-10-07 HISTORY — DX: Gastro-esophageal reflux disease without esophagitis: K21.9

## 2023-10-07 NOTE — Progress Notes (Signed)
[facility] [place]  ASSESSMENT / PLAN: 77 y.o. male with 21mm left internal iliac artery aneurysm.  Recommend:  Abstinence from all tobacco products. Blood glucose control with goal A1c < 7%. Blood pressure control with goal blood pressure < 140/90 mmHg. Lipid reduction therapy with goal LDL-C <100 mg/dL  Aspirin 81mg  PO QD.  Atorvastatin 40-80mg  PO QD (or other "high intensity" statin therapy).  No role for intervention on the internal iliac artery anuerysm  CHIEF COMPLAINT: Aneurysm CT scan  HISTORY OF PRESENT ILLNESS: Erik Odonnell is a 77 y.o. male who recently suffered an episode of hematuria after having sex.  This prompted a workup including urologic evaluation with cystoscopy.  No specific cause for his hematuria was found.  A CT scan was performed which showed a 21 mm left internal iliac artery aneurysm.  He is referred for further evaluation.  We reviewed the natural history of internal iliac aneurysm disease.  I explained the rationale for surveillance.  I explained the threshold of 3.5 cm for intervention.  Past Medical History:  Diagnosis Date   Arthritis    Diverticulosis    Gout    Hemifacial spasm    left side affected takes Artane daily;    History of bronchitis 11/2013   Hypertension    takes Ziac daily   Joint pain    Memory loss     Past Surgical History:  Procedure Laterality Date   CATARACT EXTRACTION, BILATERAL Bilateral 2015, 2016   COLONOSCOPY     COLONOSCOPY N/A 12/18/2017   diverticulosis and internal hemorhoids.   COLONOSCOPY WITH PROPOFOL N/A 08/07/2022   Procedure: COLONOSCOPY WITH PROPOFOL;  Surgeon: Lanelle Bal, DO;  [facility]: [facility];  Service: Endoscopy;  Laterality: N/A;  11:00 am   eye muschle surgeries  as a child   x 5   HERNIA REPAIR Left    as a child-inguinal;right inguinal in 1969   POLYPECTOMY  08/07/2022   Procedure: POLYPECTOMY;  Surgeon: Lanelle Bal, DO;  Location: [facility];   Service: Endoscopy;;   TOTAL HIP ARTHROPLASTY Right 03/24/2014   Procedure: TOTAL HIP ARTHROPLASTY ANTERIOR APPROACH;  Surgeon: Loreta Ave, MD;  Location: [facility] OR;  Service: Orthopedics;  Laterality: Right;   TOTAL HIP ARTHROPLASTY Left 08/26/2018   Procedure: LEFT TOTAL HIP ARTHROPLASTY ANTERIOR APPROACH;  Surgeon: Sheral Apley, MD;  Location: [facility];  Service: Orthopedics;  Laterality: Left;    Family History  Problem Relation Age of Onset   Colon cancer Neg Hx    Colon polyps Neg Hx     Social History   Socioeconomic History   Marital status: Married    Spouse name: Not on file   Number of children: 2   Years of education: Not on file   Highest education level: Bachelor's degree (e.g., BA, AB, BS)  Occupational History   Not on file  Tobacco Use   Smoking status: Never   Smokeless tobacco: Never  Vaping Use   Vaping status: Never Used  Substance and Sexual Activity   Alcohol use: Yes    Alcohol/week: 7.0 standard drinks of alcohol    Types: 7 Glasses of wine per week   Drug use: No   Sexual activity: Yes    Birth control/protection: None  Other Topics Concern   Not on file  Social History Narrative   Not on file   Social Drivers of Health   Financial Resource Strain: Not on file  Food  Insecurity: Not on file  Transportation Needs: Not on file  Physical Activity: Not on file  Stress: Not on file  Social Connections: Not on file  Intimate Partner Violence: Not on file    No Known Allergies  Current Outpatient Medications  Medication Sig Dispense Refill   allopurinol (ZYLOPRIM) 300 MG tablet Take 300 mg by mouth daily.     ALPRAZolam (XANAX) 0.5 MG tablet Take 0.25-0.5 mg by mouth at bedtime as needed for sleep.     bisoprolol-hydrochlorothiazide (ZIAC) 2.5-6.25 MG per tablet Take 1 tablet by mouth daily.     hydrocortisone (ANUSOL-HC) 2.5 % rectal cream Place 1 Application rectally 2 (two) times daily. As needed for rectal bleeding 30 g 1    ketoconazole (NIZORAL) 2 % cream Apply 1 application topically daily as needed (foot rash).     levothyroxine (SYNTHROID) 50 MCG tablet Take 50 mcg by mouth daily before breakfast.  10   Multiple Vitamin (MULTIVITAMIN) capsule Take 1 capsule by mouth daily.     Current Facility-Administered Medications  Medication Dose Route Frequency Provider Last Rate Last Admin   botulinum toxin Type A (BOTOX) injection 100 Units  100 Units Intramuscular Once Levert Feinstein, MD        PHYSICAL EXAM There were no vitals filed for this visit.  Well-appearing elderly man in no distress Regular rate and rhythm Unlabored breathing 2+ posterior tibial pulses bilaterally   PERTINENT LABORATORY AND RADIOLOGIC DATA  Most recent CBC    Latest Ref Rng & Units 08/18/2018   11:48 AM 03/25/2014    5:38 AM 03/17/2014   12:23 PM  CBC  WBC 4.0 - 10.5 K/uL 6.9  12.5  7.3   Hemoglobin 13.0 - 17.0 g/dL 16.1  09.6  04.5   Hematocrit 39.0 - 52.0 % 48.8  36.9  45.1   Platelets 150 - 400 K/uL 161  204  198      Most recent CMP    Latest Ref Rng & Units 10/04/2023    2:56 PM 08/20/2023    4:14 PM 08/18/2018   11:48 AM  CMP  Glucose 70 - 99 mg/dL   409   BUN 8 - 23 mg/dL   29   Creatinine 8.11 - 1.24 mg/dL 9.14  7.82  9.56   Sodium 135 - 145 mmol/L   135   Potassium 3.5 - 5.1 mmol/L   4.2   Chloride 98 - 111 mmol/L   100   CO2 22 - 32 mmol/L   26   Calcium 8.9 - 10.3 mg/dL   9.1    CT abdomen / pelvis: Personally reviewed. Tortuous distal aorta and bifurcation with left internal iliac artery aneurysm measuring 21mm.  Rande Brunt. Lenell Antu, MD FACS [facility] Phone Number: [phone] 10/07/2023 3:00 PM   Total time spent on preparing this encounter including chart review, data review, collecting history, examining the patient, coordinating care for this new patient, 60 minutes.  Portions of this report may have been transcribed using voice recognition software.   Every effort has been made to ensure accuracy; however, inadvertent computerized transcription errors may still be present.

## 2023-10-08 ENCOUNTER — Ambulatory Visit: Payer: Medicare PPO | Admitting: Vascular Surgery

## 2023-10-08 ENCOUNTER — Encounter: Payer: Self-pay | Admitting: Vascular Surgery

## 2023-10-08 VITALS — BP 127/83 | HR 49 | Ht 68.0 in | Wt 218.2 lb

## 2023-10-08 DIAGNOSIS — I723 Aneurysm of iliac artery: Secondary | ICD-10-CM | POA: Diagnosis not present

## 2023-11-20 ENCOUNTER — Encounter: Payer: Self-pay | Admitting: Internal Medicine

## 2023-11-20 ENCOUNTER — Ambulatory Visit: Payer: Medicare PPO | Attending: Internal Medicine | Admitting: Internal Medicine

## 2023-11-20 VITALS — BP 141/90 | HR 48 | Ht 68.0 in | Wt 221.0 lb

## 2023-11-20 DIAGNOSIS — I77819 Aortic ectasia, unspecified site: Secondary | ICD-10-CM

## 2023-11-20 DIAGNOSIS — I2510 Atherosclerotic heart disease of native coronary artery without angina pectoris: Secondary | ICD-10-CM | POA: Diagnosis not present

## 2023-11-20 DIAGNOSIS — I272 Pulmonary hypertension, unspecified: Secondary | ICD-10-CM | POA: Diagnosis not present

## 2023-11-20 DIAGNOSIS — R42 Dizziness and giddiness: Secondary | ICD-10-CM

## 2023-11-20 DIAGNOSIS — Z961 Presence of intraocular lens: Secondary | ICD-10-CM | POA: Diagnosis not present

## 2023-11-20 DIAGNOSIS — H43813 Vitreous degeneration, bilateral: Secondary | ICD-10-CM | POA: Diagnosis not present

## 2023-11-20 DIAGNOSIS — I10 Essential (primary) hypertension: Secondary | ICD-10-CM | POA: Diagnosis not present

## 2023-11-20 DIAGNOSIS — H40023 Open angle with borderline findings, high risk, bilateral: Secondary | ICD-10-CM | POA: Diagnosis not present

## 2023-11-20 MED ORDER — HYDROCHLOROTHIAZIDE 12.5 MG PO TABS: 6.2500 mg | ORAL_TABLET | Freq: Every day | ORAL | 2 refills | Status: DC

## 2023-11-20 NOTE — Patient Instructions (Signed)
Medication Instructions:  STOP Ziac (bisoprolol/hydrochlorothiazide)   START Hydrochlorothiazide 6.25 mg daily  Labwork: None today  Testing/Procedures: Your physician has requested that you have an echocardiogram. Echocardiography is a painless test that uses sound waves to create images of your heart. It provides your doctor with information about the size and shape of your heart and how well your heart's chambers and valves are working. This procedure takes approximately one hour. There are no restrictions for this procedure. Please do NOT wear cologne, perfume, aftershave, or lotions (deodorant is allowed). Please arrive 15 minutes prior to your appointment time.  Please note: We ask at that you not bring children with you during ultrasound (echo/ vascular) testing. Due to room size and safety concerns, children are not allowed in the ultrasound rooms during exams. Our front office staff cannot provide observation of children in our lobby area while testing is being conducted. An adult accompanying a patient to their appointment will only be allowed in the ultrasound room at the discretion of the ultrasound technician under special circumstances. We apologize for any inconvenience.   Follow-Up: 1 year  Any Other Special Instructions Will Be Listed Below (If Applicable).  If you need a refill on your cardiac medications before your next appointment, please call your pharmacy.

## 2023-11-24 DIAGNOSIS — I77819 Aortic ectasia, unspecified site: Secondary | ICD-10-CM | POA: Insufficient documentation

## 2023-11-24 DIAGNOSIS — R42 Dizziness and giddiness: Secondary | ICD-10-CM | POA: Insufficient documentation

## 2023-11-24 DIAGNOSIS — I272 Pulmonary hypertension, unspecified: Secondary | ICD-10-CM | POA: Insufficient documentation

## 2023-11-24 DIAGNOSIS — I2510 Atherosclerotic heart disease of native coronary artery without angina pectoris: Secondary | ICD-10-CM | POA: Insufficient documentation

## 2023-11-24 NOTE — Progress Notes (Signed)
Cardiology Office Note  Date: 11/24/2023   ID: Erik Odonnell [birth year], MRN [id number]  PCP:  Elfredia Nevins, MD  Cardiologist:  Marjo Bicker, MD Electrophysiologist:  None   History of Present Illness: Erik Odonnell is a 78 y.o. male known to have HTN, DM 2 was referred to cardiology clinic for imaging evidence of coronary artery calcifications.  Accompanied by wife.  Patient is not much active at home. He does have unsteady gait and dizziness for quite a while. Does not have symptoms of angina or DOE. No palpitations, syncope or leg swelling. He recently underwent CT chest for lung cancer screening that showed mild cardiomegaly, three-vessel coronary calcifications, aortic dilatation and pulmonary trunk dilatation consistent with pulmonary hypertension.  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) 10/07/2023   Difficulty walking 04/07/2014   IMO SNOMED Dx Update Oct 2024     Diverticulosis    Essential (primary) hypertension 10/07/2023   Gastroesophageal reflux disease without esophagitis 10/07/2023   Gout    Hemifacial spasm    left side affected takes Artane daily;    Hemifacial spasm of left side of face 05/18/2022   Hemorrhoids 09/11/2022   History of bronchitis 11/2013   Hypertension    takes Ziac daily   Impairment of balance 04/07/2014   Joint pain    Memory loss    Primary osteoarthritis 10/07/2023   Primary osteoarthritis of hip 08/26/2018   Proximal leg weakness 04/07/2014   Type 2 diabetes mellitus with diabetic neuropathy (HCC) 10/07/2023    Past Surgical History:  Procedure Laterality Date   CATARACT EXTRACTION, BILATERAL Bilateral 2015, 2016   COLONOSCOPY     COLONOSCOPY N/A 12/18/2017   diverticulosis and internal hemorhoids.   COLONOSCOPY WITH PROPOFOL N/A 08/07/2022   Procedure: COLONOSCOPY WITH PROPOFOL;  Surgeon: Lanelle Bal, DO;   [facility]: [facility];  Service: Endoscopy;  Laterality: N/A;  11:00 am   eye muschle surgeries  as a child   x 5   HERNIA REPAIR Left    as a child-inguinal;right inguinal in 1969   POLYPECTOMY  08/07/2022   Procedure: POLYPECTOMY;  Surgeon: Lanelle Bal, DO;  [facility]: [facility];  Service: Endoscopy;;   TOTAL HIP ARTHROPLASTY Right 03/24/2014   Procedure: TOTAL HIP ARTHROPLASTY ANTERIOR APPROACH;  Surgeon: Loreta Ave, MD;  Location: [facility] OR;  Service: Orthopedics;  Laterality: Right;   TOTAL HIP ARTHROPLASTY Left 08/26/2018   Procedure: LEFT TOTAL HIP ARTHROPLASTY ANTERIOR APPROACH;  Surgeon: Sheral Apley, MD;  Location: [facility];  Service: Orthopedics;  Laterality: Left;    Current Outpatient Medications  Medication Sig Dispense Refill   allopurinol (ZYLOPRIM) 300 MG tablet Take 300 mg by mouth daily.     ALPRAZolam (XANAX) 0.5 MG tablet Take 0.25-0.5 mg by mouth at bedtime as needed for sleep.     hydrochlorothiazide (HYDRODIURIL) 12.5 MG tablet Take 0.5 tablets (6.25 mg total) by mouth daily. 45 tablet 2   hydrocortisone (ANUSOL-HC) 2.5 % rectal cream Place 1 Application rectally 2 (two) times daily. As needed for rectal bleeding 30 g 1   ketoconazole (NIZORAL) 2 % cream Apply 1 application topically daily as needed (foot rash).     levothyroxine (SYNTHROID) 50 MCG tablet Take 50 mcg by mouth daily before breakfast.  10   Multiple Vitamin (MULTIVITAMIN) capsule Take 1 capsule by mouth daily.  polyethylene glycol powder (GLYCOLAX/MIRALAX) 17 GM/SCOOP powder Take by mouth daily.     Current Facility-Administered Medications  Medication Dose Route Frequency Provider Last Rate Last Admin   botulinum toxin Type A (BOTOX) injection 100 Units  100 Units Intramuscular Once Levert Feinstein, MD       Allergies:  Patient has no known allergies.   Social History: The patient  reports that he has never smoked. He has never used smokeless tobacco. He reports current alcohol use of  about 7.0 standard drinks of alcohol per week. He reports that he does not use drugs.   Family History: The patient's family history includes Aneurysm in his mother; Dementia in his mother; Heart attack in his father.   ROS:  Please see the history of present illness. Otherwise, complete review of systems is positive for none.  All other systems are reviewed and negative.   Physical Exam: VS:  BP (!) 141/90   Pulse (!) 48   Ht 5\' 8"  (1.727 m)   Wt 221 lb (100.2 kg)   SpO2 96%   BMI 33.60 kg/m , BMI Body mass index is 33.6 kg/m.  Wt Readings from Last 3 Encounters:  11/20/23 221 lb (100.2 kg)  10/08/23 218 lb 3.2 oz (99 kg)  05/08/23 217 lb (98.4 kg)    General: Patient appears comfortable at rest. HEENT: Conjunctiva and lids normal, oropharynx clear with moist mucosa. Neck: Supple, no elevated JVP or carotid bruits, no thyromegaly. Lungs: Clear to auscultation, nonlabored breathing at rest. Cardiac: Regular rate and rhythm, no S3 or significant systolic murmur, no pericardial rub. Abdomen: Soft, nontender, no hepatomegaly, bowel sounds present, no guarding or rebound. Extremities: No pitting edema, distal pulses 2+. Skin: Warm and dry. Musculoskeletal: No kyphosis. Neuropsychiatric: Alert and oriented x3, affect grossly appropriate.  Recent Labwork: 10/04/2023: Creatinine, Ser 1.40  No results found for: "CHOL", "TRIG", "HDL", "CHOLHDL", "VLDL", "LDLCALC", "LDLDIRECT"   Assessment and Plan:  Dizziness: Ongoing dizziness with unsteady gait for quite a while.  EKG showed severe sinus bradycardia with first-degree AV block, HR 48 bpm.  Discontinue bisoprolol.  Imaging evidence of three-vessel coronary artery calcifications: Incidental finding on the imaging.  Not much active.  Does not have any symptoms of angina, DOE.  Will defer any noninvasive ischemia evaluation for now.  Aortic dilatation: Aortic root is dilated at the sinuses of Valsalva, 4.4 cm and mid ascending aorta  is aneurysmal at 4.1 cm.  Obtain CTA chest/aorta in 1 year.  Prominent pulmonary trunk/pulmonary hypertension: Obtain 2D echocardiogram  HTN, controlled: Continue HCTZ 6.25 mg once daily.  Bisoprolol discontinued due to severe sinus bradycardia with first-degree AV block and symptoms of dizziness.    Medication Adjustments/Labs and Tests Ordered: Current medicines are reviewed at length with the patient today.  Concerns regarding medicines are outlined above.    Disposition:  Follow up 1 year  Signed, Baldemar Dady Verne Spurr, MD, 11/24/2023 5:12 PM    [facility] [address] [place], [place] [address]

## 2023-12-05 ENCOUNTER — Ambulatory Visit (HOSPITAL_COMMUNITY)
Admission: RE | Admit: 2023-12-05 | Discharge: 2023-12-05 | Disposition: A | Discharge status: Home / Self Care | Payer: Medicare PPO | Source: Ambulatory Visit | Attending: Internal Medicine | Admitting: Internal Medicine

## 2023-12-05 DIAGNOSIS — I2609 Other pulmonary embolism with acute cor pulmonale: Secondary | ICD-10-CM | POA: Diagnosis not present

## 2023-12-05 DIAGNOSIS — I272 Pulmonary hypertension, unspecified: Secondary | ICD-10-CM | POA: Diagnosis not present

## 2023-12-05 LAB — ECHOCARDIOGRAM COMPLETE
Area-P 1/2: 2.2 cm2
S' Lateral: 2.9 cm

## 2023-12-05 NOTE — Progress Notes (Signed)
*  PRELIMINARY RESULTS* Echocardiogram 2D Echocardiogram has been performed.  Stacey Drain 12/05/2023, 10:24 AM

## 2023-12-09 DIAGNOSIS — F5101 Primary insomnia: Secondary | ICD-10-CM | POA: Diagnosis not present

## 2023-12-09 DIAGNOSIS — Z0001 Encounter for general adult medical examination with abnormal findings: Secondary | ICD-10-CM | POA: Diagnosis not present

## 2023-12-09 DIAGNOSIS — M1991 Primary osteoarthritis, unspecified site: Secondary | ICD-10-CM | POA: Diagnosis not present

## 2023-12-09 DIAGNOSIS — Z79899 Other long term (current) drug therapy: Secondary | ICD-10-CM | POA: Diagnosis not present

## 2023-12-09 DIAGNOSIS — Z6833 Body mass index (BMI) 33.0-33.9, adult: Secondary | ICD-10-CM | POA: Diagnosis not present

## 2023-12-09 DIAGNOSIS — E6609 Other obesity due to excess calories: Secondary | ICD-10-CM | POA: Diagnosis not present

## 2023-12-09 DIAGNOSIS — R9389 Abnormal findings on diagnostic imaging of other specified body structures: Secondary | ICD-10-CM | POA: Diagnosis not present

## 2023-12-09 DIAGNOSIS — R7303 Prediabetes: Secondary | ICD-10-CM | POA: Diagnosis not present

## 2023-12-09 DIAGNOSIS — G319 Degenerative disease of nervous system, unspecified: Secondary | ICD-10-CM | POA: Diagnosis not present

## 2023-12-09 DIAGNOSIS — I10 Essential (primary) hypertension: Secondary | ICD-10-CM | POA: Diagnosis not present

## 2023-12-13 DIAGNOSIS — E6609 Other obesity due to excess calories: Secondary | ICD-10-CM | POA: Diagnosis not present

## 2023-12-13 DIAGNOSIS — I10 Essential (primary) hypertension: Secondary | ICD-10-CM | POA: Diagnosis not present

## 2023-12-13 DIAGNOSIS — F5101 Primary insomnia: Secondary | ICD-10-CM | POA: Diagnosis not present

## 2023-12-13 DIAGNOSIS — G319 Degenerative disease of nervous system, unspecified: Secondary | ICD-10-CM | POA: Diagnosis not present

## 2023-12-13 DIAGNOSIS — Z6833 Body mass index (BMI) 33.0-33.9, adult: Secondary | ICD-10-CM | POA: Diagnosis not present

## 2023-12-31 ENCOUNTER — Telehealth: Payer: Self-pay

## 2023-12-31 NOTE — Telephone Encounter (Signed)
 Received fax referral from [facility] for patient to have a sleep consult but the referral was declined due to the patient completing a sleep study within the last 3 year (SS completed in 2023). Referring office notified. Ph: [phone] / fax: [phone]

## 2024-01-15 ENCOUNTER — Encounter: Payer: Self-pay | Admitting: Pulmonary Disease

## 2024-01-15 ENCOUNTER — Ambulatory Visit (INDEPENDENT_AMBULATORY_CARE_PROVIDER_SITE_OTHER): Payer: Medicare PPO | Admitting: Pulmonary Disease

## 2024-01-15 VITALS — BP 112/76 | HR 85 | Temp 97.8°F | Ht 68.0 in | Wt 215.8 lb

## 2024-01-15 DIAGNOSIS — J849 Interstitial pulmonary disease, unspecified: Secondary | ICD-10-CM | POA: Diagnosis not present

## 2024-01-15 DIAGNOSIS — Z7722 Contact with and (suspected) exposure to environmental tobacco smoke (acute) (chronic): Secondary | ICD-10-CM

## 2024-01-15 DIAGNOSIS — R0600 Dyspnea, unspecified: Secondary | ICD-10-CM | POA: Diagnosis not present

## 2024-01-15 DIAGNOSIS — I10 Essential (primary) hypertension: Secondary | ICD-10-CM

## 2024-01-15 DIAGNOSIS — E039 Hypothyroidism, unspecified: Secondary | ICD-10-CM

## 2024-01-15 NOTE — Progress Notes (Signed)
 Erik Odonnell    [id number]    07-05-1946  Primary Care Physician:Fusco, Lyman Bishop, MD  Referring Physician: Elfredia Nevins, MD [address] [place],  [place] [address]  Chief complaint: Consult for abnormal CT, concern for interstitial lung disease  HPI: 78 y.o. 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  Discussed the use of AI scribe software for clinical note transcription with the patient, who gave verbal consent to proceed.  History of Present Illness Erik Odonnell is a 78 year old male who presents for evaluation following a CT scan showing lung abnormalities.  A CT scan of the abdomen and pelvis was initially performed due to hematuria, which led to a subsequent chest CT scan revealing a small abnormality at the lung base. He currently has no symptoms related to this finding.  He has a history of recurrent bronchitis, particularly following colds, occurring approximately once a year, with the last episode in October 2023 after a trip to the [facility]. He experiences no shortness of breath between episodes.  He has a history of a persistent cough that can lead to syncope, with the most significant episode occurring about 12-13 years ago, lasting from March to May. He occasionally experiences choking and  difficulty swallowing large pills. He takes Zyrtec nightly for what is believed to be drainage or allergies.  He has never smoked but was exposed to secondhand smoke from his parents, who were chain smokers. He also has a history of exposure to [employer] and asbestos during his time in the [employer] from 1968 to 1972. 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 He has no pets currently and no recent travel except for a trip to [place] in 2023.   Pets: Used to have a dog Occupation: Retired history Runner, broadcasting/film/video and peach farmer Exposures: No mold, hot tub, Jacuzzi.  No feather pillows or comforter.  Was exposed to agent orange and asbestos while in the [employer] in the 1960s Smoking history: Never smoker Travel history: No significant travel history Relevant family history: No family history of lung disease  Outpatient Encounter Medications as of 01/15/2024  Medication Sig   allopurinol (ZYLOPRIM) 300 MG tablet Take 300 mg by mouth daily.   ALPRAZolam (XANAX) 0.5 MG tablet Take 0.25-0.5 mg by mouth at bedtime as needed for sleep.   cetirizine HCl (ZYRTEC) 1 MG/ML solution Take 10 mg by mouth daily.   ergocalciferol (VITAMIN D2) 1.25 MG (50000 UT) capsule Take 50,000 Units by mouth once a week.   Flaxseed, Linseed, (FLAXSEED OIL) 1000 MG CAPS    hydrochlorothiazide (HYDRODIURIL) 12.5 MG tablet Take 0.5 tablets (6.25 mg total) by mouth daily.   hydrocortisone (ANUSOL-HC) 2.5 % rectal cream Place 1 Application rectally 2 (two) times daily. As needed for rectal bleeding   ketoconazole (NIZORAL) 2 % cream Apply 1 application topically daily as needed (foot rash).   levothyroxine (SYNTHROID) 50 MCG tablet Take 50 mcg by mouth daily before breakfast.   lisinopril-hydrochlorothiazide (ZESTORETIC) 20-12.5 MG tablet  Take 1 tablet by mouth daily.   polyethylene glycol powder (GLYCOLAX/MIRALAX) 17 GM/SCOOP powder Take by mouth daily.   traZODone (DESYREL) 100 MG tablet Take 100 mg by  mouth daily.   [DISCONTINUED] Multiple Vitamin (MULTIVITAMIN) capsule Take 1 capsule by mouth daily.   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) 10/07/2023   Difficulty walking 04/07/2014   IMO SNOMED Dx Update Oct 2024     Diverticulosis    Essential (primary) hypertension 10/07/2023   Gastroesophageal reflux disease without esophagitis 10/07/2023   Gout    Hemifacial spasm    left side affected takes Artane daily;    Hemifacial spasm of left side of face 05/18/2022   Hemorrhoids 09/11/2022   History of bronchitis 11/2013   Hypertension    takes Ziac daily   Impairment of balance 04/07/2014   Joint pain    Memory loss    Primary osteoarthritis 10/07/2023   Primary osteoarthritis of hip 08/26/2018   Proximal leg weakness 04/07/2014   Type 2 diabetes mellitus with diabetic neuropathy (HCC) 10/07/2023    Past Surgical History:  Procedure Laterality Date   CATARACT EXTRACTION, BILATERAL Bilateral 2015, 2016   COLONOSCOPY     COLONOSCOPY N/A 12/18/2017   diverticulosis and internal hemorhoids.   COLONOSCOPY WITH PROPOFOL N/A 08/07/2022   Procedure: COLONOSCOPY WITH PROPOFOL;  Surgeon: Lanelle Bal, DO;  Location: [facility];  Service: Endoscopy;  Laterality: N/A;  11:00 am   eye muschle surgeries  as a child   x 5   HERNIA REPAIR Left    as a child-inguinal;right inguinal in 1969   POLYPECTOMY  08/07/2022   Procedure: POLYPECTOMY;  Surgeon: Lanelle Bal, DO;  Location: [facility];  Service: Endoscopy;;   TOTAL HIP ARTHROPLASTY Right 03/24/2014   Procedure: TOTAL HIP ARTHROPLASTY ANTERIOR APPROACH;  Surgeon: Loreta Ave, MD;  Location: [facility] OR;  Service: Orthopedics;   Laterality: Right;   TOTAL HIP ARTHROPLASTY Left 08/26/2018   Procedure: LEFT TOTAL HIP ARTHROPLASTY ANTERIOR APPROACH;  Surgeon: Sheral Apley, MD;  Location: [facility] ORS;  Service: Orthopedics;  Laterality: Left;    Family History  Problem Relation Age of Onset   Aneurysm Mother        Brain   Dementia Mother    Heart attack Father    Colon cancer Neg Hx    Colon polyps Neg Hx     Social History   Socioeconomic History   Marital status: Married    Spouse name: Not on file   Number of children: 2   Years of education: Not on file   Highest education level: Bachelor's degree (e.g., BA, AB, BS)  Occupational History   Not on file  Tobacco Use   Smoking status: Never   Smokeless tobacco: Never  Vaping Use   Vaping status: Never Used  Substance and Sexual Activity   Alcohol use: Yes    Alcohol/week: 7.0 standard drinks of alcohol    Types: 7 Glasses of wine per week   Drug use: No   Sexual activity: Yes    Partners: Female    Birth control/protection: None    Comment: married  Other Topics Concern   Not on  file  Social History Narrative   Not on file   Social Drivers of Health   Financial Resource Strain: Not on file  Food Insecurity: Not on file  Transportation Needs: Not on file  Physical Activity: Not on file  Stress: Not on file  Social Connections: Not on file  Intimate Partner Violence: Not on file    Review of systems: Review of Systems  Constitutional: Negative for fever and chills.  HENT: Negative.   Eyes: Negative for blurred vision.  Respiratory: as per HPI  Cardiovascular: Negative for chest pain and palpitations.  Gastrointestinal: Negative for vomiting, diarrhea, blood per rectum. Genitourinary: Negative for dysuria, urgency, frequency and hematuria.  Musculoskeletal: Negative for myalgias, back pain and joint pain.  Skin: Negative for itching and rash.  Neurological: Negative for dizziness, tremors, focal weakness, seizures and loss of  consciousness.  Endo/Heme/Allergies: Negative for environmental allergies.  Psychiatric/Behavioral: Negative for depression, suicidal ideas and hallucinations.  All other systems reviewed and are negative.  Physical Exam: Blood pressure 112/76, pulse 85, temperature 97.8 F (36.6 C), temperature source Oral, height 5\' 8"  (1.727 m), weight 215 lb 12.8 oz (97.9 kg), SpO2 92%. Gen:      No acute distress HEENT:  EOMI, sclera anicteric Neck:     No masses; no thyromegaly Lungs:    Clear to auscultation bilaterally; normal respiratory effort CV:         Regular rate and rhythm; no murmurs Abd:      + bowel sounds; soft, non-tender; no palpable masses, no distension Ext:    No edema; adequate peripheral perfusion Skin:      Warm and dry; no rash Neuro: alert and oriented x 3 Psych: normal mood and affect  Data Reviewed: Imaging: CT abdomen pelvis 08/20/2023-visualized lungs show mild scattered subpleural reticular densities CT chest 10/04/2023-mild subpleural reticulation, linear fibrosis in the left lower lobe I had reviewed images personally.  PFTs:  Labs:  Assessment and Plan Assessment & Plan Interstitial Lung Disease CT scan of the abdomen and chest revealed mild interstitial changes, including scarring and inflammation. Possible etiologies include past asbestos or secondhand smoke exposure, or autoimmune diseases such as lupus or rheumatoid arthritis. The findings are mild and not currently concerning. Interstitial lung disease can range from benign to life-threatening, but in this case, it appears mild.  - Order blood tests to check for autoimmune diseases such as lupus or rheumatoid arthritis. - Schedule a high-resolution CT scan of the chest in six months to further evaluate the lungs. - Perform lung function tests in six months to assess pulmonary function. - Schedule the CT scan and lung function tests at [facility]. - Review results two to three weeks  post-testing.  Recurrent Bronchitis Recurrent bronchitis, particularly post-colds, with the last episode in October 2023. Currently asymptomatic between episodes.  Follow-up Plan to monitor interstitial lung changes and assess lung function. - Schedule follow-up appointment two to three weeks after the CT scan and lung function tests to review results.   Recommendations: Baseline CTD serologies Follow-up high-res CT, PFTs in 6 months  Chilton Greathouse MD [facility] 01/15/2024, 1:41 PM  CC: Elfredia Nevins, MD

## 2024-01-15 NOTE — Patient Instructions (Signed)
 VISIT SUMMARY:  Erik Odonnell, a 78 year old male, visited [place] today to discuss findings from a recent CT scan that showed mild interstitial changes in his lungs. He has a history of recurrent bronchitis, hypertension, and hypothyroidism, all of which are currently managed. He has no symptoms related to the lung findings and has a history of exposure to secondhand smoke, asbestos, and Agent Orange.  YOUR PLAN:  -INTERSTITIAL LUNG DISEASE: Interstitial lung disease involves scarring and inflammation of the lung tissue. Your CT scan showed mild changes, which could be due to past exposures to asbestos or secondhand smoke, or autoimmune diseases. We will conduct blood tests to check for autoimmune diseases and schedule a high-resolution CT scan and lung function tests in six months to monitor your condition. These tests will be done at [facility], and we will review the results two to three weeks after the tests.  -RECURRENT BRONCHITIS: Recurrent bronchitis is an inflammation of the bronchial tubes, often following colds. You have experienced this about once a year, with the last episode in October 2023. You are currently asymptomatic between episodes.  -HYPERTENSION: Hypertension is high blood pressure. Your condition is currently managed with your existing treatment plan.  -HYPOTHYROIDISM: Hypothyroidism is a condition where the thyroid gland does not produce enough thyroid hormone. Your condition is currently managed with your existing treatment plan.  INSTRUCTIONS:  Please schedule a high-resolution CT scan and lung function tests at [facility] in six months. We will review the results two to three weeks after the tests. Additionally, we will conduct blood tests to check for autoimmune diseases such as lupus or rheumatoid arthritis.

## 2024-01-16 ENCOUNTER — Telehealth: Payer: Self-pay | Admitting: Internal Medicine

## 2024-01-16 MED ORDER — HYDROCHLOROTHIAZIDE 12.5 MG PO TABS: 12.5000 mg | ORAL_TABLET | Freq: Every day | ORAL | 3 refills | Status: DC

## 2024-01-16 NOTE — Telephone Encounter (Signed)
 Pt c/o medication issue:  1. Name of Medication: hydrochlorothiazide (HYDRODIURIL) 12.5 MG tablet   lisinopril-hydrochlorothiazide (ZESTORETIC) 20-12.5 MG tablet  2. How are you currently taking this medication (dosage and times per day)? 12.5mg  every day, and 1 tablet, Daily of Lisin-HCTZ  3. Are you having a reaction (difficulty breathing--STAT)? No  4. What is your medication issue? Needs clarification if supposed to be taking both, requesting cb

## 2024-01-16 NOTE — Telephone Encounter (Signed)
 Wife notified and verbalized understanding.

## 2024-01-16 NOTE — Telephone Encounter (Addendum)
 Spoke to pt's wife who stated that Lisinopril- HCTZ was prescribed on 2/28 by Edison Pace, PA at [facility].   Pt's wife is questioning if pt needs to be on both HCTZ and Lisinopril-HCTZ.   Pt is currently taking both medications- Pt is taking a whole tablet of HCTZ and half a tablet of Lisinopril-HCTZ.   Pt's wife stated that this is working for him.   Please advise.

## 2024-01-16 NOTE — Telephone Encounter (Signed)
 Per Dr. Jenene Slicker in secure chat:  Molli Knock if it's working for him, let's not make any changes   Pt currently taking 12.5 mg of HCTZ

## 2024-01-17 DIAGNOSIS — M25551 Pain in right hip: Secondary | ICD-10-CM | POA: Diagnosis not present

## 2024-01-17 DIAGNOSIS — M545 Low back pain, unspecified: Secondary | ICD-10-CM | POA: Diagnosis not present

## 2024-01-17 DIAGNOSIS — M25552 Pain in left hip: Secondary | ICD-10-CM | POA: Diagnosis not present

## 2024-01-17 LAB — ANA,IFA RA DIAG PNL W/RFLX TIT/PATN
Cyclic Citrullin Peptide Ab: <16 U
Rheumatoid fact SerPl-aCnc: <10 [IU]/mL (ref <14)
Rheumatoid fact SerPl-aCnc: POSITIVE [IU]/mL — AB

## 2024-01-17 LAB — ANTI-NUCLEAR AB-TITER (ANA TITER): ANA Titer 1: 1:80 {titer} — ABNORMAL HIGH

## 2024-01-22 DIAGNOSIS — M150 Primary generalized (osteo)arthritis: Secondary | ICD-10-CM | POA: Diagnosis not present

## 2024-01-22 DIAGNOSIS — M5416 Radiculopathy, lumbar region: Secondary | ICD-10-CM | POA: Diagnosis not present

## 2024-01-22 DIAGNOSIS — M47816 Spondylosis without myelopathy or radiculopathy, lumbar region: Secondary | ICD-10-CM | POA: Diagnosis not present

## 2024-01-22 DIAGNOSIS — G319 Degenerative disease of nervous system, unspecified: Secondary | ICD-10-CM | POA: Diagnosis not present

## 2024-01-22 DIAGNOSIS — E6609 Other obesity due to excess calories: Secondary | ICD-10-CM | POA: Diagnosis not present

## 2024-01-22 DIAGNOSIS — Z6832 Body mass index (BMI) 32.0-32.9, adult: Secondary | ICD-10-CM | POA: Diagnosis not present

## 2024-01-22 DIAGNOSIS — I10 Essential (primary) hypertension: Secondary | ICD-10-CM | POA: Diagnosis not present

## 2024-01-27 ENCOUNTER — Encounter: Payer: Self-pay | Admitting: Pulmonary Disease

## 2024-02-03 ENCOUNTER — Ambulatory Visit (HOSPITAL_COMMUNITY)
Admission: RE | Admit: 2024-02-03 | Discharge: 2024-02-03 | Disposition: A | Discharge status: Home / Self Care | Source: Ambulatory Visit | Attending: Internal Medicine | Admitting: Internal Medicine

## 2024-02-03 ENCOUNTER — Other Ambulatory Visit (HOSPITAL_COMMUNITY): Payer: Self-pay | Admitting: Internal Medicine

## 2024-02-03 DIAGNOSIS — M5416 Radiculopathy, lumbar region: Secondary | ICD-10-CM | POA: Diagnosis not present

## 2024-02-03 DIAGNOSIS — R051 Acute cough: Secondary | ICD-10-CM | POA: Diagnosis not present

## 2024-02-03 DIAGNOSIS — Z6832 Body mass index (BMI) 32.0-32.9, adult: Secondary | ICD-10-CM | POA: Diagnosis not present

## 2024-02-03 DIAGNOSIS — R7303 Prediabetes: Secondary | ICD-10-CM | POA: Diagnosis not present

## 2024-02-03 DIAGNOSIS — E6609 Other obesity due to excess calories: Secondary | ICD-10-CM | POA: Diagnosis not present

## 2024-02-03 DIAGNOSIS — R059 Cough, unspecified: Secondary | ICD-10-CM | POA: Diagnosis not present

## 2024-02-03 DIAGNOSIS — M47816 Spondylosis without myelopathy or radiculopathy, lumbar region: Secondary | ICD-10-CM | POA: Diagnosis not present

## 2024-02-03 DIAGNOSIS — J209 Acute bronchitis, unspecified: Secondary | ICD-10-CM | POA: Diagnosis not present

## 2024-02-07 ENCOUNTER — Encounter (HOSPITAL_COMMUNITY): Payer: Self-pay

## 2024-02-07 ENCOUNTER — Ambulatory Visit (HOSPITAL_COMMUNITY): Attending: Orthopedic Surgery

## 2024-02-07 ENCOUNTER — Other Ambulatory Visit: Payer: Self-pay

## 2024-02-07 DIAGNOSIS — M6281 Muscle weakness (generalized): Secondary | ICD-10-CM | POA: Insufficient documentation

## 2024-02-07 DIAGNOSIS — M5442 Lumbago with sciatica, left side: Secondary | ICD-10-CM | POA: Insufficient documentation

## 2024-02-07 NOTE — Addendum Note (Signed)
 Addended by: Irene Mannheim D on: 02/07/2024 03:20 PM   Modules accepted: Orders

## 2024-02-07 NOTE — Therapy (Addendum)
 OUTPATIENT PHYSICAL THERAPY THORACOLUMBAR EVALUATION   Patient Name: Erik Odonnell MRN: [id number] DOB:October 17, 1946, 78 y.o., male Today's Date: 02/07/2024  END OF SESSION:  PT End of Session - 02/07/24 1304     Visit Number 1    Number of Visits 13    Authorization Type [employer]    Authorization Time Period Seeking new auth    Progress Note Due on Visit 10    PT Start Time 1300    PT Stop Time 1341    PT Time Calculation (min) 41 min    Behavior During Therapy [facility] 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) 10/07/2023   Difficulty walking 04/07/2014   IMO SNOMED Dx Update Oct 2024     Diverticulosis    Essential (primary) hypertension 10/07/2023   Gastroesophageal reflux disease without esophagitis 10/07/2023   Gout    Hemifacial spasm    left side affected takes Artane  daily;    Hemifacial spasm of left side of face 05/18/2022   Hemorrhoids 09/11/2022   History of bronchitis 11/2013   Hypertension    takes Ziac  daily   Impairment of balance 04/07/2014   Joint pain    Memory loss    Primary osteoarthritis 10/07/2023   Primary osteoarthritis of hip 08/26/2018   Proximal leg weakness 04/07/2014   Type 2 diabetes mellitus with diabetic neuropathy (HCC) 10/07/2023   Past Surgical History:  Procedure Laterality Date   CATARACT EXTRACTION, BILATERAL Bilateral 2015, 2016   COLONOSCOPY     COLONOSCOPY N/A 12/18/2017   diverticulosis and internal hemorhoids.   COLONOSCOPY WITH PROPOFOL  N/A 08/07/2022   Procedure: COLONOSCOPY WITH PROPOFOL ;  Surgeon: Cindie Carlin POUR, DO;  Location: [facility];  Service: Endoscopy;  Laterality: N/A;  11:00 am   eye muschle surgeries  as a child   x 5   HERNIA REPAIR Left    as a child-inguinal;right inguinal in 1969   POLYPECTOMY  08/07/2022   Procedure: POLYPECTOMY;  Surgeon:  Cindie Carlin POUR, DO;  Location: [facility];  Service: Endoscopy;;   TOTAL HIP ARTHROPLASTY Right 03/24/2014   Procedure: TOTAL HIP ARTHROPLASTY ANTERIOR APPROACH;  Surgeon: Toribio JULIANNA Chancy, MD;  Location: [facility] OR;  Service: Orthopedics;  Laterality: Right;   TOTAL HIP ARTHROPLASTY Left 08/26/2018   Procedure: LEFT TOTAL HIP ARTHROPLASTY ANTERIOR APPROACH;  Surgeon: Chancy Evalene BIRCH, MD;  Location: [facility] OR[facility];  Service: Orthopedics;  Laterality: Left;   Patient Active Problem List   Diagnosis Date Noted   Pulmonary hypertension, unspecified (HCC) 11/24/2023   Coronary artery calcification 11/24/2023   Dizziness 11/24/2023   Aortic dilatation (HCC) 11/24/2023   Type 2 diabetes mellitus with diabetic neuropathy (HCC) 10/07/2023   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) 10/07/2023   Essential (primary) hypertension 10/07/2023   Gastroesophageal reflux disease without esophagitis 10/07/2023   Primary osteoarthritis 10/07/2023   Hemorrhoids 09/11/2022   Hemifacial spasm of left side of face 05/18/2022   Cognitive impairment 05/08/2022   Primary osteoarthritis of hip 08/26/2018   Proximal leg weakness 04/07/2014   Difficulty walking 04/07/2014   Impairment of balance 04/07/2014   Primary localized osteoarthrosis, pelvic region and thigh 03/24/2014    PCP: Bertell Satterfield, MD  REFERRING PROVIDER: Chancy Evalene MATSU, DO  REFERRING DIAG: 'Lumbago'  Rationale for Evaluation and Treatment: Rehabilitation  THERAPY DIAG:  Acute left-sided low back pain with left-sided sciatica  Muscle weakness (generalized)  ONSET DATE: ~ 2 weeks  SUBJECTIVE:                                                                                                                                                                                           SUBJECTIVE STATEMENT: Pt reports that about 2 weeks ago, after using his chainsaw  and noticed the pain that runs down the back down to his ankle.  Currently used pain medications. Worst 6/10 pain. Pt's wife reports that the pt reported that his pain is less in the morning and gets worse throughout the day.  PERTINENT HISTORY:  Bilateral hip replacements  PAIN:  Are you having pain? Yes: NPRS scale: 6/10 Pain location: L buttocks and runs down his LLE Pain description: Radiating, dull and mashing on the L hip Aggravating factors: all the time Relieving factors: pain medications, relieving recliner  PRECAUTIONS: None  RED FLAGS: None   WEIGHT BEARING RESTRICTIONS: No  FALLS:  Has patient fallen in last 6 months? No   PATIENT GOALS: getting out of pain  OBJECTIVE:  Note: Objective measures were completed at Evaluation unless otherwise noted.  DIAGNOSTIC FINDINGS:    PATIENT SURVEYS:  Modified Oswestry 14/50 = 28%   COGNITION: Overall cognitive status: Within functional limits for tasks assessed     SENSATION: WFL  POSTURE: rounded shoulders, forward head, increased lumbar lordosis, and posterior pelvic tilt  PALPATION: Hypomobile in CPAs and UPAs throughout Lumbar spine; unable to reproduce concordant pain  LUMBAR ROM:   AROM eval  Flexion 75%   Extension 75%  Right lateral flexion 75%  Left lateral flexion 75% * and something is not right  Right rotation 75%  Left rotation 75%   (Blank rows = not tested)  LOWER EXTREMITY ROM:     Active  Right eval Left eval  Hip flexion    Hip extension    Hip abduction    Hip adduction    Hip internal rotation    Hip external rotation    Knee flexion    Knee extension    Ankle dorsiflexion    Ankle plantarflexion    Ankle inversion    Ankle eversion     (Blank rows = not tested)  LOWER EXTREMITY MMT:    MMT Right eval Left eval  Hip flexion 3+ 3+  Hip extension 3+ 3+  Hip abduction 3+ 3+  Hip adduction    Hip internal rotation    Hip external rotation    Knee flexion    Knee extension 4 4  Ankle dorsiflexion 4 4  Ankle plantarflexion     Ankle inversion    Ankle eversion     (Blank rows = not tested)  LUMBAR SPECIAL TESTS:  Straight leg raise test: Negative, Slump test: Negative, Quadrant test: Negative, and Single leg stance test: Negative  FUNCTIONAL TESTS:  .30 Second Chair Stand Test: 9 Norms:  Age 84-64 62-69 70-74 75-79 80-84 85-89 90-94  Women 15 15 14 13 12 11 9   Men 17 16 15 14 13 11 9    L SLS: Unable  R SLS: Unable  Norms: 18-39  F: 43.5 seconds  M: 43.2 seconds 40-49  F: 40.4 seconds  M: 40.1 seconds 50-59  F: 36 seconds  M: 38.1 seconds 60-69  F: 25.1 seconds  M: 28.7 seconds 70-79  F: 11.3 seconds  M: 18.3 seconds  2 minute walk test: To initiate next session  GAIT: Distance walked: 28ft Assistive device utilized: None Level of assistance: Complete Independence Comments: Antalgia with LLE and bilatearl trendelenberg. Pt with decreased LLE step length and stance phase.   TREATMENT DATE: 02/07/2024 Evaluation and HEP below Supine bridge x 15 Sidelying Clamshell x15                                                                                                                                 PATIENT EDUCATION:  Education details: PT Evaluation, findings, prognosis, frequency, attendance policy, and HEP. Person educated: Patient Education method: Medical Illustrator Education comprehension: verbalized understanding  HOME EXERCISE PROGRAM: Access Code: [email or web address] URL: [email or web address] Date: 02/07/2024 Prepared by: Omega Bottcher  Exercises - Supine Bridge  - 1 x daily - 7 x weekly - 3 sets - 12-15 reps - 3 hold - Clamshell  - 1 x daily - 7 x weekly - 3 sets - 12-15 reps - 3 hold  ASSESSMENT:  CLINICAL IMPRESSION: Patient is a 78 y.o. male who was seen today for physical therapy evaluation and treatment for 'Lumbago.' Unable to reproduce concordant pain but reports LLE radiating pain and major balance deficits. Pt demonstrating reduced Lumbar ROM,  hip and BLE muscle weakness, abnormal gait, balance deficits and pain limiting pt's functional mobility, transfers, ADLs and desired functional dynamic activities. Pt will benefit from skilled Physical Therapy services to address deficits/limitations in order to improve functional and QOL.    OBJECTIVE IMPAIRMENTS: Abnormal gait, decreased activity tolerance, decreased balance, decreased endurance, decreased mobility, decreased ROM, decreased strength, hypomobility, increased fascial restrictions, postural dysfunction, and pain.   ACTIVITY LIMITATIONS: carrying, lifting, bending, standing, squatting, stairs, bed mobility, and reach over head  PARTICIPATION LIMITATIONS: cleaning, laundry, driving, shopping, community activity, occupation, and yard work  PERSONAL FACTORS: Age and Past/current experiences are also affecting patient's functional outcome.   REHAB POTENTIAL: Good  CLINICAL DECISION MAKING: Evolving/moderate complexity  EVALUATION COMPLEXITY: Low   GOALS: Goals reviewed with patient? No  SHORT TERM GOALS: Target date: 02/28/24  Pt will be independent with HEP  in order to demonstrate participation in Physical Therapy POC.  Baseline: Goal status: INITIAL  2.  Pt will report 5/10 pain with mobility in order to demonstrate improved pain with ADLs.  Baseline:  Goal status: INITIAL  LONG TERM GOALS: Target date: 03/20/24  Pt will improve 30 Second Chair Stand Test by at least 2 reps in order to demonstrate improved functional strength to return to desired activities.  Baseline: see objective.  Goal status: INITIAL  2.  Pt will improve Single Leg balance by at least 5 seconds bilaterally in order to demonstrate improved functional safety during community setting Baseline: see objective.  Goal status: INITIAL  3.  Pt will improve Modified Oswestry score by 8 points in order to demonstrate improved pain with functional goals and outcomes. Baseline: see objective.  Goal status:  INITIAL  4.  Pt will report 3/10 pain with mobility in order to demonstrate reduced pain with ADLs lasting greater than 30 minutes.  Baseline: see objective.  Goal status: INITIAL  PLAN:  PT FREQUENCY: 2x/week  PT DURATION: 6 weeks  PLANNED INTERVENTIONS: [id number]- PT Re-evaluation, 97750- Physical Performance Testing, 97110-Therapeutic exercises, 97530- Therapeutic activity, [id number]- Neuromuscular re-education, 97535- Self Care, [id number]- Manual therapy, [phone]- Gait training, [phone]- Electrical stimulation (unattended), [phone]- Electrical stimulation (manual), [id number]- Traction (mechanical), Patient/Family education, Balance training, Stair training, Taping, Dry Needling, Joint mobilization, Joint manipulation, Spinal manipulation, Spinal mobilization, Cryotherapy, and Moist heat.  PLAN FOR NEXT SESSION: Hip strengthening, ROM of Lumbar spine, balance Omega JONETTA Donna ALMETA, DPT [facility] [place] [phone] office  [facility] Auth Request  Referring diagnosis code (ICD 10)? M54.42 Treatment diagnosis codes (ICD 10)? (if different than referring diagnosis) M62.81 What was this (referring dx) caused by? []  Surgery []  Fall [x]  Ongoing issue [x]  Arthritis []  Other: ____________  Laterality: []  Rt [x]  Lt [x]  Both  Deficits: [x]  Pain [x]  Stiffness [x]  Weakness []  Edema [x]  Balance Deficits []  Coordination []  Gait Disturbance [x]  ROM []  Other   Functional Tool Score: Modified Oswestry: 14/50 = 28%  CPT codes: See Planned Interventions listed in the Plan section of the Evaluation.    Omega JONETTA Donna, PT 02/07/2024, 1:41 PM

## 2024-02-10 ENCOUNTER — Ambulatory Visit (HOSPITAL_COMMUNITY)

## 2024-02-10 NOTE — Addendum Note (Signed)
 Addended by: Irene Mannheim D on: 02/10/2024 11:04 AM   Modules accepted: Orders

## 2024-02-17 ENCOUNTER — Ambulatory Visit (HOSPITAL_COMMUNITY)

## 2024-02-18 ENCOUNTER — Encounter (HOSPITAL_COMMUNITY): Payer: Self-pay

## 2024-02-18 ENCOUNTER — Ambulatory Visit (HOSPITAL_COMMUNITY)

## 2024-02-18 DIAGNOSIS — M6281 Muscle weakness (generalized): Secondary | ICD-10-CM

## 2024-02-18 DIAGNOSIS — M5442 Lumbago with sciatica, left side: Secondary | ICD-10-CM

## 2024-02-18 NOTE — Therapy (Signed)
 OUTPATIENT PHYSICAL THERAPY THORACOLUMBAR EVALUATION   Patient Name: Erik Odonnell MRN: [id number] DOB:1945/12/11, 78 y.o., male Today's Date: 02/18/2024  END OF SESSION:  PT End of Session - 02/18/24 1149     Visit Number 2    Number of Visits 13    Authorization Type [employer]    Authorization Time Period Seeking new auth    Progress Note Due on Visit 10    PT Start Time 1149    PT Stop Time 1231    PT Time Calculation (min) 42 min    Activity Tolerance Patient tolerated treatment well    Behavior During Therapy [facility] 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) 10/07/2023   Difficulty walking 04/07/2014   IMO SNOMED Dx Update Oct 2024     Diverticulosis    Essential (primary) hypertension 10/07/2023   Gastroesophageal reflux disease without esophagitis 10/07/2023   Gout    Hemifacial spasm    left side affected takes Artane  daily;    Hemifacial spasm of left side of face 05/18/2022   Hemorrhoids 09/11/2022   History of bronchitis 11/2013   Hypertension    takes Ziac  daily   Impairment of balance 04/07/2014   Joint pain    Memory loss    Primary osteoarthritis 10/07/2023   Primary osteoarthritis of hip 08/26/2018   Proximal leg weakness 04/07/2014   Type 2 diabetes mellitus with diabetic neuropathy (HCC) 10/07/2023   Past Surgical History:  Procedure Laterality Date   CATARACT EXTRACTION, BILATERAL Bilateral 2015, 2016   COLONOSCOPY     COLONOSCOPY N/A 12/18/2017   diverticulosis and internal hemorhoids.   COLONOSCOPY WITH PROPOFOL  N/A 08/07/2022   Procedure: COLONOSCOPY WITH PROPOFOL ;  Surgeon: Vinetta Greening, DO;  Location: [facility];  Service: Endoscopy;  Laterality: N/A;  11:00 am   eye muschle surgeries  as a child   x 5   HERNIA REPAIR Left    as a child-inguinal;right inguinal in 1969   POLYPECTOMY   08/07/2022   Procedure: POLYPECTOMY;  Surgeon: Vinetta Greening, DO;  Location: [facility];  Service: Endoscopy;;   TOTAL HIP ARTHROPLASTY Right 03/24/2014   Procedure: TOTAL HIP ARTHROPLASTY ANTERIOR APPROACH;  Surgeon: Ferd Householder, MD;  Location: [facility];  Service: Orthopedics;  Laterality: Right;   TOTAL HIP ARTHROPLASTY Left 08/26/2018   Procedure: LEFT TOTAL HIP ARTHROPLASTY ANTERIOR APPROACH;  Surgeon: Saundra Curl, MD;  Location: [facility];  Service: Orthopedics;  Laterality: Left;   Patient Active Problem List   Diagnosis Date Noted   Pulmonary hypertension, unspecified (HCC) 11/24/2023   Coronary artery calcification 11/24/2023   Dizziness 11/24/2023   Aortic dilatation (HCC) 11/24/2023   Type 2 diabetes mellitus with diabetic neuropathy (HCC) 10/07/2023   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) 10/07/2023   Essential (primary) hypertension 10/07/2023   Gastroesophageal reflux disease without esophagitis 10/07/2023   Primary osteoarthritis 10/07/2023   Hemorrhoids 09/11/2022   Hemifacial spasm of left side of face 05/18/2022   Cognitive impairment 05/08/2022   Primary osteoarthritis of hip 08/26/2018   Proximal leg weakness 04/07/2014   Difficulty walking 04/07/2014   Impairment of balance 04/07/2014   Primary localized osteoarthrosis, pelvic region and thigh 03/24/2014    PCP: Kathyleen Parkins, MD  REFERRING PROVIDER: Ward Guy, DO  REFERRING DIAG: '  Lumbago'  Rationale for Evaluation and Treatment: Rehabilitation  THERAPY DIAG:  Acute left-sided low back pain with left-sided sciatica  Muscle weakness (generalized)  ONSET DATE: ~ 2 weeks  SUBJECTIVE:                                                                                                                                                                                           SUBJECTIVE STATEMENT: Patient's wife reports was unable to reproduce symptoms during session but says  pt reported pain whenever they went into the lobby and inc pain the next day. Pt reports hasn't taken pain meds for pain in a couple days. Reports he goes to see MD about scoliosis tomorrow. Reports near fall but can't remember when but sounds like a while ago  Pt reports that about 2 weeks ago, after using his chainsaw  and noticed the pain that runs down the back down to his ankle. Currently used pain medications. Worst 6/10 pain. Pt's wife reports that the pt reported that his pain is less in the morning and gets worse throughout the day.  PERTINENT HISTORY:  Bilateral hip replacements  PAIN:  Are you having pain? Yes: NPRS scale: 6/10 Pain location: L buttocks and runs down his LLE Pain description: Radiating, dull and mashing on the L hip Aggravating factors: "all the time" Relieving factors: pain medications, relieving recliner  PRECAUTIONS: None  RED FLAGS: None   WEIGHT BEARING RESTRICTIONS: No  FALLS:  Has patient fallen in last 6 months? No   PATIENT GOALS: "getting out of pain"  OBJECTIVE:  Note: Objective measures were completed at Evaluation unless otherwise noted.  DIAGNOSTIC FINDINGS:    PATIENT SURVEYS:  Modified Oswestry 14/50 = 28%   COGNITION: Overall cognitive status: Within functional limits for tasks assessed     SENSATION: WFL  POSTURE: rounded shoulders, forward head, increased lumbar lordosis, and posterior pelvic tilt  PALPATION: Hypomobile in CPAs and UPAs throughout Lumbar spine; unable to reproduce concordant pain  LUMBAR ROM:   AROM eval  Flexion 75%   Extension 75%  Right lateral flexion 75%  Left lateral flexion 75% * and "something is not right"  Right rotation 75%  Left rotation 75%   (Blank rows = not tested)  LOWER EXTREMITY ROM:     Active  Right eval Left eval  Hip flexion    Hip extension    Hip abduction    Hip adduction    Hip internal rotation    Hip external rotation    Knee flexion    Knee extension     Ankle dorsiflexion    Ankle plantarflexion  Ankle inversion    Ankle eversion     (Blank rows = not tested)  LOWER EXTREMITY MMT:    MMT Right eval Left eval  Hip flexion 3+ 3+  Hip extension 3+ 3+  Hip abduction 3+ 3+  Hip adduction    Hip internal rotation    Hip external rotation    Knee flexion    Knee extension 4 4  Ankle dorsiflexion 4 4  Ankle plantarflexion    Ankle inversion    Ankle eversion     (Blank rows = not tested)  LUMBAR SPECIAL TESTS:  Straight leg raise test: Negative, Slump test: Negative, Quadrant test: Negative, and Single leg stance test: Negative  FUNCTIONAL TESTS:  .30 Second Chair Stand Test: 9 Norms:  Age 54-64 33-69 70-74 75-79 80-84 85-89 90-94  Women 15 15 14 13 12 11 9   Men 17 16 15 14 13 11 9    L SLS: Unable  R SLS: Unable  Norms: 18-39  F: 43.5 seconds  M: 43.2 seconds 40-49  F: 40.4 seconds  M: 40.1 seconds 50-59  F: 36 seconds  M: 38.1 seconds 60-69  F: 25.1 seconds  M: 28.7 seconds 70-79  F: 11.3 seconds  M: 18.3 seconds  2 minute walk test: To initiate next session  GAIT: Distance walked: 78ft Assistive device utilized: None Level of assistance: Complete Independence Comments: Antalgia with LLE and bilatearl trendelenberg. Pt with decreased LLE step length and stance phase.   TREATMENT DATE:  02/18/24: Review of HEP Bridges: 2x10 Clamshells: 2x10 Side steps: 20 ftx4, v cues for inc step length Hip extension at // bars: 2x10,  STS: 10x2, reports fatigue at  rep 3 or 4  DGI 1. Gait level surface (3) Normal: Walks 20', no assistive devices, good sped, no evidence for imbalance, normal gait pattern 2. Change in gait speed (2) Mild Impairment: Is able to change speed but demonstrates mild gait deviations, or not gait deviations but unable to achieve a significant change in velocity, or uses an assistive device. 3. Gait with horizontal head turns (2) Mild Impairment: Performs head turns smoothly with  slight change in gait velocity, i.e., minor disruption to smooth gait path or uses walking aid. 4. Gait with vertical head turns (2) Mild Impairment: Performs head turns smoothly with slight change in gait velocity, i.e., minor disruption to smooth gait path or uses walking aid. 5. Gait and pivot turn (1) Moderate Impairment: Turns slowly, requires verbal cueing, requires several small steps to catch balance following turn and stop. 6. Step over obstacle (2) Mild Impairment: Is able to step over box, but must slow down and adjust steps to clear box safely. 7. Step around obstacles (2) Mild Impairment: Is able to step around both cones, but must slow down and adjust steps to clear cones. 8. Stairs (2) Mild Impairment: Alternating feet, must use rail.  TOTAL SCORE: 16 / 24  Tandem balance  RLE leading : 10"  LLE leading : 4" SLS:  LLE; 3"  RLE: 2"    02/18/2024 Evaluation and HEP below Supine bridge x 15 Sidelying Clamshell x15  PATIENT EDUCATION:  Education details: PT Evaluation, findings, prognosis, frequency, attendance policy, and HEP. Person educated: Patient Education method: Medical illustrator Education comprehension: verbalized understanding  HOME EXERCISE PROGRAM: Access Code: [email or web address] URL: [email or web address] Date: 02/07/2024 Prepared by: Irene Mannheim  Exercises - Supine Bridge  - 1 x daily - 7 x weekly - 3 sets - 12-15 reps - 3 hold - Clamshell  - 1 x daily - 7 x weekly - 3 sets - 12-15 reps - 3 hold  -Single leg balance with counter support -Side stepping   ASSESSMENT:  CLINICAL IMPRESSION: Patient tolerated session well. Good carryover of form with bridge and clamshells from previously prescribed HEP.  Progressed LE strengthening with side steps, hip extension, and sit to stands. No overt LOB or  unsteadiness t/o. DGI performed on this date. Biggest impairment with pivot and mild impairments with stepping over obstacles, change in gait speed, and head turns. No overt LOB t/o however. Tandem and SL balance impaired, pt unable to hold more than a few seconds with out grabbing hold of bar for support. Addition of side steps and SLS to HEP. Explained to wife at end of session. No increase of LBP during session. Patient will benefit from continued skilled physical therapy in order to address balance, intermittent LE pain, LE strength, and endurance in order to improve function and QOL.    Patient is a 78 y.o. male who was seen today for physical therapy evaluation and treatment for 'Lumbago.' Unable to reproduce concordant pain but reports LLE radiating pain and major balance deficits. Pt demonstrating reduced Lumbar ROM, hip and BLE muscle weakness, abnormal gait, balance deficits and pain limiting pt's functional mobility, transfers, ADLs and desired functional dynamic activities. Pt will benefit from skilled Physical Therapy services to address deficits/limitations in order to improve functional and QOL.    OBJECTIVE IMPAIRMENTS: Abnormal gait, decreased activity tolerance, decreased balance, decreased endurance, decreased mobility, decreased ROM, decreased strength, hypomobility, increased fascial restrictions, postural dysfunction, and pain.   ACTIVITY LIMITATIONS: carrying, lifting, bending, standing, squatting, stairs, bed mobility, and reach over head  PARTICIPATION LIMITATIONS: cleaning, laundry, driving, shopping, community activity, occupation, and yard work  PERSONAL FACTORS: Age and Past/current experiences are also affecting patient's functional outcome.   REHAB POTENTIAL: Good  CLINICAL DECISION MAKING: Evolving/moderate complexity  EVALUATION COMPLEXITY: Low   GOALS: Goals reviewed with patient? No  SHORT TERM GOALS: Target date: 02/28/24  Pt will be independent with HEP in  order to demonstrate participation in Physical Therapy POC.  Baseline: Goal status: INITIAL  2.  Pt will report 5/10 pain with mobility in order to demonstrate improved pain with ADLs.  Baseline:  Goal status: INITIAL  LONG TERM GOALS: Target date: 03/20/24  Pt will improve 30 Second Chair Stand Test by at least 2 reps in order to demonstrate improved functional strength to return to desired activities.  Baseline: see objective.  Goal status: INITIAL  2.  Pt will improve Single Leg balance by at least 5 seconds bilaterally in order to demonstrate improved functional safety during community setting Baseline: see objective.  Goal status: INITIAL  3.  Pt will improve Modified Oswestry score by 8 points in order to demonstrate improved pain with functional goals and outcomes. Baseline: see objective.  Goal status: INITIAL  4.  Pt will report 3/10 pain with mobility in order to demonstrate reduced pain with ADLs lasting greater than 30 minutes.  Baseline: see objective.  Goal status: INITIAL  PLAN:  PT FREQUENCY: 2x/week  PT DURATION: 6 weeks  PLANNED INTERVENTIONS: 97164- PT Re-evaluation, 97750- Physical Performance Testing, 97110-Therapeutic exercises, 97530- Therapeutic activity, [id number]- Neuromuscular re-education, 97535- Self Care, 96045- Manual therapy, [phone]- Gait training, [phone]- Electrical stimulation (unattended), [phone]- Electrical stimulation (manual), [id number]- Traction (mechanical), Patient/Family education, Balance training, Stair training, Taping, Dry Needling, Joint mobilization, Joint manipulation, Spinal manipulation, Spinal mobilization, Cryotherapy, and Moist heat.  PLAN FOR NEXT SESSION: Hip strengthening, ROM of Lumbar spine, balance   12:44 PM, 02/18/24 Andy Moye Powell-Butler, PT, DPT [facility] - Cataract

## 2024-02-19 DIAGNOSIS — Z6831 Body mass index (BMI) 31.0-31.9, adult: Secondary | ICD-10-CM | POA: Diagnosis not present

## 2024-02-19 DIAGNOSIS — M5416 Radiculopathy, lumbar region: Secondary | ICD-10-CM | POA: Diagnosis not present

## 2024-02-26 ENCOUNTER — Ambulatory Visit (HOSPITAL_COMMUNITY): Attending: Orthopedic Surgery | Admitting: Physical Therapy

## 2024-02-26 DIAGNOSIS — M5442 Lumbago with sciatica, left side: Secondary | ICD-10-CM | POA: Insufficient documentation

## 2024-02-26 DIAGNOSIS — M25551 Pain in right hip: Secondary | ICD-10-CM | POA: Diagnosis not present

## 2024-02-26 DIAGNOSIS — Z9181 History of falling: Secondary | ICD-10-CM | POA: Diagnosis not present

## 2024-02-26 DIAGNOSIS — M6281 Muscle weakness (generalized): Secondary | ICD-10-CM | POA: Diagnosis not present

## 2024-02-26 NOTE — Therapy (Signed)
 OUTPATIENT PHYSICAL THERAPY THORACOLUMBAR EVALUATION   Patient Name: Erik Odonnell MRN: [id number] DOB:[birth year], 78 y.o., male Today's Date: 02/26/2024  END OF SESSION:  PT End of Session - 02/26/24 1024     Visit Number 3    Number of Visits 13    Authorization Type [employer]    Authorization Time Period Seeking new auth    Progress Note Due on Visit 10    PT Start Time 1021    PT Stop Time 1101    PT Time Calculation (min) 40 min    Activity Tolerance Patient tolerated treatment well    Behavior During Therapy [facility] 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) 10/07/2023   Difficulty walking 04/07/2014   IMO SNOMED Dx Update Oct 2024     Diverticulosis    Essential (primary) hypertension 10/07/2023   Gastroesophageal reflux disease without esophagitis 10/07/2023   Gout    Hemifacial spasm    left side affected takes Artane  daily;    Hemifacial spasm of left side of face 05/18/2022   Hemorrhoids 09/11/2022   History of bronchitis 11/2013   Hypertension    takes Ziac  daily   Impairment of balance 04/07/2014   Joint pain    Memory loss    Primary osteoarthritis 10/07/2023   Primary osteoarthritis of hip 08/26/2018   Proximal leg weakness 04/07/2014   Type 2 diabetes mellitus with diabetic neuropathy (HCC) 10/07/2023   Past Surgical History:  Procedure Laterality Date   CATARACT EXTRACTION, BILATERAL Bilateral 2015, 2016   COLONOSCOPY     COLONOSCOPY N/A 12/18/2017   diverticulosis and internal hemorhoids.   COLONOSCOPY WITH PROPOFOL  N/A 08/07/2022   Procedure: COLONOSCOPY WITH PROPOFOL ;  Surgeon: Vinetta Greening, DO;  Location: [facility];  Service: Endoscopy;  Laterality: N/A;  11:00 am   eye muschle surgeries  as a child   x 5   HERNIA REPAIR Left    as a child-inguinal;right inguinal in 1969   POLYPECTOMY   08/07/2022   Procedure: POLYPECTOMY;  Surgeon: Vinetta Greening, DO;  Location: [facility];  Service: Endoscopy;;   TOTAL HIP ARTHROPLASTY Right 03/24/2014   Procedure: TOTAL HIP ARTHROPLASTY ANTERIOR APPROACH;  Surgeon: Ferd Householder, MD;  Location: [facility] OR;  Service: Orthopedics;  Laterality: Right;   TOTAL HIP ARTHROPLASTY Left 08/26/2018   Procedure: LEFT TOTAL HIP ARTHROPLASTY ANTERIOR APPROACH;  Surgeon: Saundra Curl, MD;  Location: [facility];  Service: Orthopedics;  Laterality: Left;   Patient Active Problem List   Diagnosis Date Noted   Pulmonary hypertension, unspecified (HCC) 11/24/2023   Coronary artery calcification 11/24/2023   Dizziness 11/24/2023   Aortic dilatation (HCC) 11/24/2023   Type 2 diabetes mellitus with diabetic neuropathy (HCC) 10/07/2023   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) 10/07/2023   Essential (primary) hypertension 10/07/2023   Gastroesophageal reflux disease without esophagitis 10/07/2023   Primary osteoarthritis 10/07/2023   Hemorrhoids 09/11/2022   Hemifacial spasm of left side of face 05/18/2022   Cognitive impairment 05/08/2022   Primary osteoarthritis of hip 08/26/2018   Proximal leg weakness 04/07/2014   Difficulty walking 04/07/2014   Impairment of balance 04/07/2014   Primary localized osteoarthrosis, pelvic region and thigh 03/24/2014    PCP: Kathyleen Parkins, MD  REFERRING PROVIDER: Ward Guy, DO  REFERRING DIAG: '  Lumbago'  Rationale for Evaluation and Treatment: Rehabilitation  THERAPY DIAG:  Acute left-sided low back pain with left-sided sciatica  Muscle weakness (generalized)  ONSET DATE: ~ 2 weeks  SUBJECTIVE:                                                                                                                                                                                           SUBJECTIVE STATEMENT: Patient reports he feels he's getting stronger. No pain or falls.  Pt reports  that about 2 weeks ago, after using his chainsaw  and noticed the pain that runs down the back down to his ankle. Currently used pain medications. Worst 6/10 pain. Pt's wife reports that the pt reported that his pain is less in the morning and gets worse throughout the day.  PERTINENT HISTORY:  Bilateral hip replacements  PAIN:  Are you having pain? Yes: NPRS scale: 6/10 Pain location: L buttocks and runs down his LLE Pain description: Radiating, dull and mashing on the L hip Aggravating factors: "all the time" Relieving factors: pain medications, relieving recliner  PRECAUTIONS: None  RED FLAGS: None   WEIGHT BEARING RESTRICTIONS: No  FALLS:  Has patient fallen in last 6 months? No   PATIENT GOALS: "getting out of pain"  OBJECTIVE:  Note: Objective measures were completed at Evaluation unless otherwise noted.  DIAGNOSTIC FINDINGS:    PATIENT SURVEYS:  Modified Oswestry 14/50 = 28%   COGNITION: Overall cognitive status: Within functional limits for tasks assessed     SENSATION: WFL  POSTURE: rounded shoulders, forward head, increased lumbar lordosis, and posterior pelvic tilt  PALPATION: Hypomobile in CPAs and UPAs throughout Lumbar spine; unable to reproduce concordant pain  LUMBAR ROM:   AROM eval  Flexion 75%   Extension 75%  Right lateral flexion 75%  Left lateral flexion 75% * and "something is not right"  Right rotation 75%  Left rotation 75%   (Blank rows = not tested)  LOWER EXTREMITY ROM:     Active  Right eval Left eval  Hip flexion    Hip extension    Hip abduction    Hip adduction    Hip internal rotation    Hip external rotation    Knee flexion    Knee extension    Ankle dorsiflexion    Ankle plantarflexion    Ankle inversion    Ankle eversion     (Blank rows = not tested)  LOWER EXTREMITY MMT:    MMT Right eval Left eval  Hip flexion 3+ 3+  Hip extension 3+ 3+  Hip abduction 3+ 3+  Hip adduction  Hip internal rotation     Hip external rotation    Knee flexion    Knee extension 4 4  Ankle dorsiflexion 4 4  Ankle plantarflexion    Ankle inversion    Ankle eversion     (Blank rows = not tested)  LUMBAR SPECIAL TESTS:  Straight leg raise test: Negative, Slump test: Negative, Quadrant test: Negative, and Single leg stance test: Negative  FUNCTIONAL TESTS:  .30 Second Chair Stand Test: 9 Norms:  Age 67-64 84-69 70-74 75-79 80-84 85-89 90-94  Women 15 15 14 13 12 11 9   Men 17 16 15 14 13 11 9    L SLS: Unable  R SLS: Unable  Norms: 18-39  F: 43.5 seconds  M: 43.2 seconds 40-49  F: 40.4 seconds  M: 40.1 seconds 50-59  F: 36 seconds  M: 38.1 seconds 60-69  F: 25.1 seconds  M: 28.7 seconds 70-79  F: 11.3 seconds  M: 18.3 seconds  2 minute walk test: To initiate next session  GAIT: Distance walked: 56ft Assistive device utilized: None Level of assistance: Complete Independence Comments: Antalgia with LLE and bilatearl trendelenberg. Pt with decreased LLE step length and stance phase.   TREATMENT DATE:  02/26/24 Standing:  in parallel bars: heel raises on incline 20X  Toe raises on decline 20X  Hip abduction 2X10 each LE  Hip extension 2X10 each LE  Lunges onto 4" step 2X10 each LE with no UE assist for stability  Tandem stance 2X30" each LE lead with 10-20" max before having to use UE to steady himself  Vector stance 10X3"   SLS each LE  Sit to stand no UE 5 reps  02/18/24: Review of HEP Bridges: 2x10 Clamshells: 2x10 Side steps: 20 ftx4, v cues for inc step length Hip extension at // bars: 2x10,  STS: 10x2, reports fatigue at  rep 3 or 4  DGI 1. Gait level surface (3) Normal: Walks 20', no assistive devices, good sped, no evidence for imbalance, normal gait pattern 2. Change in gait speed (2) Mild Impairment: Is able to change speed but demonstrates mild gait deviations, or not gait deviations but unable to achieve a significant change in velocity, or uses an assistive  device. 3. Gait with horizontal head turns (2) Mild Impairment: Performs head turns smoothly with slight change in gait velocity, i.e., minor disruption to smooth gait path or uses walking aid. 4. Gait with vertical head turns (2) Mild Impairment: Performs head turns smoothly with slight change in gait velocity, i.e., minor disruption to smooth gait path or uses walking aid. 5. Gait and pivot turn (1) Moderate Impairment: Turns slowly, requires verbal cueing, requires several small steps to catch balance following turn and stop. 6. Step over obstacle (2) Mild Impairment: Is able to step over box, but must slow down and adjust steps to clear box safely. 7. Step around obstacles (2) Mild Impairment: Is able to step around both cones, but must slow down and adjust steps to clear cones. 8. Stairs (2) Mild Impairment: Alternating feet, must use rail.  TOTAL SCORE: 16 / 24  Tandem balance  RLE leading : 10"  LLE leading : 4" SLS:  LLE; 3"  RLE: 2"    02/26/2024 Evaluation and HEP below Supine bridge x 15 Sidelying Clamshell x15  PATIENT EDUCATION:  Education details: PT Evaluation, findings, prognosis, frequency, attendance policy, and HEP. Person educated: Patient Education method: Medical illustrator Education comprehension: verbalized understanding  HOME EXERCISE PROGRAM: Access Code: [email or web address] URL: [email or web address] Date: 02/07/2024 Prepared by: Irene Mannheim  Exercises - Supine Bridge  - 1 x daily - 7 x weekly - 3 sets - 12-15 reps - 3 hold - Clamshell  - 1 x daily - 7 x weekly - 3 sets - 12-15 reps - 3 hold  -Single leg balance with counter support -Side stepping   ASSESSMENT:  CLINICAL IMPRESSION: Progressed with standing strengthening/stability exercises this session.  Pt with diffiuclty keeping count of therex  and some confusion/coordination issues with activities but overall minimal cues needed.  PT with most difficulty with SLS with inability to maintain greater than 2 seconds.  Unsure if due to ability or fear.  Tandem stance was challenging with max of 15 seconds with either LE leading.  PT tolerated well without pain or near falls. Minimal rest breaks needed. Patient will benefit from continued skilled physical therapy in order to address balance, intermittent LE pain, LE strength, and endurance in order to improve function and QOL.    Patient is a 78 y.o. male who was seen today for physical therapy evaluation and treatment for 'Lumbago.' Unable to reproduce concordant pain but reports LLE radiating pain and major balance deficits. Pt demonstrating reduced Lumbar ROM, hip and BLE muscle weakness, abnormal gait, balance deficits and pain limiting pt's functional mobility, transfers, ADLs and desired functional dynamic activities. Pt will benefit from skilled Physical Therapy services to address deficits/limitations in order to improve functional and QOL.    OBJECTIVE IMPAIRMENTS: Abnormal gait, decreased activity tolerance, decreased balance, decreased endurance, decreased mobility, decreased ROM, decreased strength, hypomobility, increased fascial restrictions, postural dysfunction, and pain.   ACTIVITY LIMITATIONS: carrying, lifting, bending, standing, squatting, stairs, bed mobility, and reach over head  PARTICIPATION LIMITATIONS: cleaning, laundry, driving, shopping, community activity, occupation, and yard work  PERSONAL FACTORS: Age and Past/current experiences are also affecting patient's functional outcome.   REHAB POTENTIAL: Good  CLINICAL DECISION MAKING: Evolving/moderate complexity  EVALUATION COMPLEXITY: Low   GOALS: Goals reviewed with patient? No  SHORT TERM GOALS: Target date: 02/28/24  Pt will be independent with HEP in order to demonstrate participation in Physical Therapy  POC.  Baseline: Goal status: INITIAL  2.  Pt will report 5/10 pain with mobility in order to demonstrate improved pain with ADLs.  Baseline:  Goal status: INITIAL  LONG TERM GOALS: Target date: 03/20/24  Pt will improve 30 Second Chair Stand Test by at least 2 reps in order to demonstrate improved functional strength to return to desired activities.  Baseline: see objective.  Goal status: INITIAL  2.  Pt will improve Single Leg balance by at least 5 seconds bilaterally in order to demonstrate improved functional safety during community setting Baseline: see objective.  Goal status: INITIAL  3.  Pt will improve Modified Oswestry score by 8 points in order to demonstrate improved pain with functional goals and outcomes. Baseline: see objective.  Goal status: INITIAL  4.  Pt will report 3/10 pain with mobility in order to demonstrate reduced pain with ADLs lasting greater than 30 minutes.  Baseline: see objective.  Goal status: INITIAL  PLAN:  PT FREQUENCY: 2x/week  PT DURATION: 6 weeks  PLANNED INTERVENTIONS: 97164- PT Re-evaluation, 97750- Physical Performance Testing, 97110-Therapeutic exercises, 97530- Therapeutic activity, [id number]- Neuromuscular re-education, 97535- Self Care, 47829- Manual therapy, [id number]- Gait  training, [phone]- Electrical stimulation (unattended), [phone]- Electrical stimulation (manual), 78295- Traction (mechanical), Patient/Family education, Balance training, Stair training, Taping, Dry Needling, Joint mobilization, Joint manipulation, Spinal manipulation, Spinal mobilization, Cryotherapy, and Moist heat.  PLAN FOR NEXT SESSION: Hip strengthening, ROM of Lumbar spine, balance   12:14 PM, 02/26/24 Lorenso Romance, PTA/CLT [facility] Ph: [phone]

## 2024-02-27 DIAGNOSIS — X32XXXD Exposure to sunlight, subsequent encounter: Secondary | ICD-10-CM | POA: Diagnosis not present

## 2024-02-27 DIAGNOSIS — L570 Actinic keratosis: Secondary | ICD-10-CM | POA: Diagnosis not present

## 2024-02-28 ENCOUNTER — Ambulatory Visit (HOSPITAL_COMMUNITY)

## 2024-02-28 ENCOUNTER — Encounter (HOSPITAL_COMMUNITY): Payer: Self-pay

## 2024-02-28 DIAGNOSIS — M6281 Muscle weakness (generalized): Secondary | ICD-10-CM

## 2024-02-28 DIAGNOSIS — M5442 Lumbago with sciatica, left side: Secondary | ICD-10-CM | POA: Diagnosis not present

## 2024-02-28 DIAGNOSIS — Z9181 History of falling: Secondary | ICD-10-CM

## 2024-02-28 DIAGNOSIS — M25551 Pain in right hip: Secondary | ICD-10-CM

## 2024-02-28 NOTE — Therapy (Signed)
 OUTPATIENT PHYSICAL THERAPY THORACOLUMBAR TREATMENT   Patient Name: Erik Odonnell MRN: [id number] DOB:Jul 05, 1946, 78 y.o., male Today's Date: 02/28/2024  END OF SESSION:  PT End of Session - 02/28/24 1018     Visit Number 4    Number of Visits 13    Date for PT Re-Evaluation 03/06/24    Authorization Type [employer]    Progress Note Due on Visit 10    PT Start Time 1019    PT Stop Time 1058    PT Time Calculation (min) 39 min    Activity Tolerance Patient tolerated treatment well    Behavior During Therapy [facility] 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) 10/07/2023   Difficulty walking 04/07/2014   IMO SNOMED Dx Update Oct 2024     Diverticulosis    Essential (primary) hypertension 10/07/2023   Gastroesophageal reflux disease without esophagitis 10/07/2023   Gout    Hemifacial spasm    left side affected takes Artane  daily;    Hemifacial spasm of left side of face 05/18/2022   Hemorrhoids 09/11/2022   History of bronchitis 11/2013   Hypertension    takes Ziac  daily   Impairment of balance 04/07/2014   Joint pain    Memory loss    Primary osteoarthritis 10/07/2023   Primary osteoarthritis of hip 08/26/2018   Proximal leg weakness 04/07/2014   Type 2 diabetes mellitus with diabetic neuropathy (HCC) 10/07/2023   Past Surgical History:  Procedure Laterality Date   CATARACT EXTRACTION, BILATERAL Bilateral 2015, 2016   COLONOSCOPY     COLONOSCOPY N/A 12/18/2017   diverticulosis and internal hemorhoids.   COLONOSCOPY WITH PROPOFOL  N/A 08/07/2022   Procedure: COLONOSCOPY WITH PROPOFOL ;  Surgeon: Vinetta Greening, DO;  Location: [facility];  Service: Endoscopy;  Laterality: N/A;  11:00 am   eye muschle surgeries  as a child   x 5   HERNIA REPAIR Left    as a child-inguinal;right inguinal in 1969   POLYPECTOMY   08/07/2022   Procedure: POLYPECTOMY;  Surgeon: Vinetta Greening, DO;  Location: [facility];  Service: Endoscopy;;   TOTAL HIP ARTHROPLASTY Right 03/24/2014   Procedure: TOTAL HIP ARTHROPLASTY ANTERIOR APPROACH;  Surgeon: Ferd Householder, MD;  Location: [facility] OR;  Service: Orthopedics;  Laterality: Right;   TOTAL HIP ARTHROPLASTY Left 08/26/2018   Procedure: LEFT TOTAL HIP ARTHROPLASTY ANTERIOR APPROACH;  Surgeon: Saundra Curl, MD;  Location: [facility];  Service: Orthopedics;  Laterality: Left;   Patient Active Problem List   Diagnosis Date Noted   Pulmonary hypertension, unspecified (HCC) 11/24/2023   Coronary artery calcification 11/24/2023   Dizziness 11/24/2023   Aortic dilatation (HCC) 11/24/2023   Type 2 diabetes mellitus with diabetic neuropathy (HCC) 10/07/2023   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) 10/07/2023   Essential (primary) hypertension 10/07/2023   Gastroesophageal reflux disease without esophagitis 10/07/2023   Primary osteoarthritis 10/07/2023   Hemorrhoids 09/11/2022   Hemifacial spasm of left side of face 05/18/2022   Cognitive impairment 05/08/2022   Primary osteoarthritis of hip 08/26/2018   Proximal leg weakness 04/07/2014   Difficulty walking 04/07/2014   Impairment of balance 04/07/2014   Primary localized osteoarthrosis, pelvic region and thigh 03/24/2014    PCP: Kathyleen Parkins, MD  REFERRING PROVIDER: Ward Guy, DO  REFERRING DIAG: 'Lumbago'  Rationale for Evaluation and Treatment: Rehabilitation  THERAPY DIAG:  Acute left-sided low back pain with left-sided sciatica  Muscle weakness (generalized)  Pain in right hip  History of falling  ONSET DATE: ~ 2 weeks  SUBJECTIVE:                                                                                                                                                                                           SUBJECTIVE STATEMENT: Reports he feels good today, no  reoprts of pain or recent falls.  Stated he has some difficulty with balance and memory.  Pt reports that about 2 weeks ago, after using his chainsaw  and noticed the pain that runs down the back down to his ankle. Currently used pain medications. Worst 6/10 pain. Pt's wife reports that the pt reported that his pain is less in the morning and gets worse throughout the day.  PERTINENT HISTORY:  Bilateral hip replacements  PAIN:  Are you having pain? Yes: NPRS scale: 6/10 Pain location: L buttocks and runs down his LLE Pain description: Radiating, dull and mashing on the L hip Aggravating factors: "all the time" Relieving factors: pain medications, relieving recliner  PRECAUTIONS: None  RED FLAGS: None   WEIGHT BEARING RESTRICTIONS: No  FALLS:  Has patient fallen in last 6 months? No   PATIENT GOALS: "getting out of pain"  OBJECTIVE:  Note: Objective measures were completed at Evaluation unless otherwise noted.  DIAGNOSTIC FINDINGS:    PATIENT SURVEYS:  Modified Oswestry 14/50 = 28%   COGNITION: Overall cognitive status: Within functional limits for tasks assessed     SENSATION: WFL  POSTURE: rounded shoulders, forward head, increased lumbar lordosis, and posterior pelvic tilt  PALPATION: Hypomobile in CPAs and UPAs throughout Lumbar spine; unable to reproduce concordant pain  LUMBAR ROM:   AROM eval  Flexion 75%   Extension 75%  Right lateral flexion 75%  Left lateral flexion 75% * and "something is not right"  Right rotation 75%  Left rotation 75%   (Blank rows = not tested)  LOWER EXTREMITY ROM:     Active  Right eval Left eval  Hip flexion    Hip extension    Hip abduction    Hip adduction    Hip internal rotation    Hip external rotation    Knee flexion    Knee extension    Ankle dorsiflexion    Ankle plantarflexion    Ankle inversion    Ankle eversion     (Blank rows = not tested)  LOWER EXTREMITY MMT:    MMT Right eval Left eval   Hip  flexion 3+ 3+  Hip extension 3+ 3+  Hip abduction 3+ 3+  Hip adduction    Hip internal rotation    Hip external rotation    Knee flexion    Knee extension 4 4  Ankle dorsiflexion 4 4  Ankle plantarflexion    Ankle inversion    Ankle eversion     (Blank rows = not tested)  LUMBAR SPECIAL TESTS:  Straight leg raise test: Negative, Slump test: Negative, Quadrant test: Negative, and Single leg stance test: Negative  FUNCTIONAL TESTS:  .30 Second Chair Stand Test: 9 Norms:  Age 69-64 2-69 70-74 75-79 80-84 85-89 90-94  Women 15 15 14 13 12 11 9   Men 17 16 15 14 13 11 9    L SLS: Unable  R SLS: Unable  Norms: 18-39  F: 43.5 seconds  M: 43.2 seconds 40-49  F: 40.4 seconds  M: 40.1 seconds 50-59  F: 36 seconds  M: 38.1 seconds 60-69  F: 25.1 seconds  M: 28.7 seconds 70-79  F: 11.3 seconds  M: 18.3 seconds  2 minute walk test: To initiate next session  GAIT: Distance walked: 61ft Assistive device utilized: None Level of assistance: Complete Independence Comments: Antalgia with LLE and bilatearl trendelenberg. Pt with decreased LLE step length and stance phase.   TREATMENT DATE:  02/28/24 Standing in // bars  STS no HHA eccentric control 10x  Marching alternating 12x 5"   Hip abduction 2x 12 each LE RTB.  Sidestep with RTB around thigh 4RT inside //bars hands hovering over bars  Tandem stance 3x 30" with 10-20" max before need for UE to steady himself  Squat front of chair 2x 10  SLS 1-2" max   Vector stance 5x 5" heel raises on incline 20X  Toe raises on decline 20X -postural strengthening RTB shoulder extension and rows 15x each with tactile cueing     02/26/24 Standing:  in parallel bars: heel raises on incline 20X  Toe raises on decline 20X  Hip abduction 2X10 each LE  Hip extension 2X10 each LE  Lunges onto 4" step 2X10 each LE with no UE assist for stability  Tandem stance 2X30" each LE lead with 10-20" max before having to use UE to steady  himself  Vector stance 10X3"   SLS each LE  Sit to stand no UE 5 reps  02/18/24: Review of HEP Bridges: 2x10 Clamshells: 2x10 Side steps: 20 ftx4, v cues for inc step length Hip extension at // bars: 2x10,  STS: 10x2, reports fatigue at  rep 3 or 4  DGI 1. Gait level surface (3) Normal: Walks 20', no assistive devices, good sped, no evidence for imbalance, normal gait pattern 2. Change in gait speed (2) Mild Impairment: Is able to change speed but demonstrates mild gait deviations, or not gait deviations but unable to achieve a significant change in velocity, or uses an assistive device. 3. Gait with horizontal head turns (2) Mild Impairment: Performs head turns smoothly with slight change in gait velocity, i.e., minor disruption to smooth gait path or uses walking aid. 4. Gait with vertical head turns (2) Mild Impairment: Performs head turns smoothly with slight change in gait velocity, i.e., minor disruption to smooth gait path or uses walking aid. 5. Gait and pivot turn (1) Moderate Impairment: Turns slowly, requires verbal cueing, requires several small steps to catch balance following turn and stop. 6. Step over obstacle (2) Mild Impairment: Is able to step over box, but must slow down and  adjust steps to clear box safely. 7. Step around obstacles (2) Mild Impairment: Is able to step around both cones, but must slow down and adjust steps to clear cones. 8. Stairs (2) Mild Impairment: Alternating feet, must use rail.  TOTAL SCORE: 16 / 24  Tandem balance  RLE leading : 10"  LLE leading : 4" SLS:  LLE; 3"  RLE: 2"    02/28/2024 Evaluation and HEP below Supine bridge x 15 Sidelying Clamshell x15                                                                                                                                 PATIENT EDUCATION:  Education details: PT Evaluation, findings, prognosis, frequency, attendance policy, and HEP. Person educated: Patient Education  method: Medical illustrator Education comprehension: verbalized understanding  HOME EXERCISE PROGRAM: Access Code: [email or web address] URL: [email or web address] Date: 02/07/2024 Prepared by: Irene Mannheim  Exercises - Supine Bridge  - 1 x daily - 7 x weekly - 3 sets - 12-15 reps - 3 hold - Clamshell  - 1 x daily - 7 x weekly - 3 sets - 12-15 reps - 3 hold  -Single leg balance with counter support -Side stepping   ASSESSMENT:  CLINICAL IMPRESSION: Session focus with standing hip strengthening and stability.  Pt with some difficulty with coordination activities and required extra cueing initially for proper form and mechanics, min cueing needed following.  Pt counted out loud to keep tract of reps.  Added theraband resistance and squats for hip strengthening and additional balance activities.  Pt tolerated well to session with no reports of increased pain.  Did required cueing for posture through session.  SLS most difficult, vector stance required 1 HHA for balance assistance and improved form.  Added postural strengthening exercises to POC.   Patient is a 77 y.o. male who was seen today for physical therapy evaluation and treatment for 'Lumbago.' Unable to reproduce concordant pain but reports LLE radiating pain and major balance deficits. Pt demonstrating reduced Lumbar ROM, hip and BLE muscle weakness, abnormal gait, balance deficits and pain limiting pt's functional mobility, transfers, ADLs and desired functional dynamic activities. Pt will benefit from skilled Physical Therapy services to address deficits/limitations in order to improve functional and QOL.    OBJECTIVE IMPAIRMENTS: Abnormal gait, decreased activity tolerance, decreased balance, decreased endurance, decreased mobility, decreased ROM, decreased strength, hypomobility, increased fascial restrictions, postural dysfunction, and pain.   ACTIVITY LIMITATIONS: carrying, lifting, bending, standing, squatting,  stairs, bed mobility, and reach over head  PARTICIPATION LIMITATIONS: cleaning, laundry, driving, shopping, community activity, occupation, and yard work  PERSONAL FACTORS: Age and Past/current experiences are also affecting patient's functional outcome.   REHAB POTENTIAL: Good  CLINICAL DECISION MAKING: Evolving/moderate complexity  EVALUATION COMPLEXITY: Low   GOALS: Goals reviewed with patient? No  SHORT TERM GOALS: Target date: 02/28/24  Pt will be independent with HEP in order to demonstrate participation in Physical Therapy POC.  Baseline: Goal status: INITIAL  2.  Pt will report 5/10 pain with mobility in order to demonstrate improved pain with ADLs.  Baseline:  Goal status: INITIAL  LONG TERM GOALS: Target date: 03/20/24  Pt will improve 30 Second Chair Stand Test by at least 2 reps in order to demonstrate improved functional strength to return to desired activities.  Baseline: see objective.  Goal status: INITIAL  2.  Pt will improve Single Leg balance by at least 5 seconds bilaterally in order to demonstrate improved functional safety during community setting Baseline: see objective.  Goal status: INITIAL  3.  Pt will improve Modified Oswestry score by 8 points in order to demonstrate improved pain with functional goals and outcomes. Baseline: see objective.  Goal status: INITIAL  4.  Pt will report 3/10 pain with mobility in order to demonstrate reduced pain with ADLs lasting greater than 30 minutes.  Baseline: see objective.  Goal status: INITIAL  PLAN:  PT FREQUENCY: 2x/week  PT DURATION: 6 weeks  PLANNED INTERVENTIONS: [id number]- PT Re-evaluation, 97750- Physical Performance Testing, 97110-Therapeutic exercises, 97530- Therapeutic activity, [id number]- Neuromuscular re-education, 97535- Self Care, 95621- Manual therapy, [phone]- Gait training, [phone]- Electrical stimulation (unattended), [phone]- Electrical stimulation (manual), [id number]- Traction (mechanical),  Patient/Family education, Balance training, Stair training, Taping, Dry Needling, Joint mobilization, Joint manipulation, Spinal manipulation, Spinal mobilization, Cryotherapy, and Moist heat.  PLAN FOR NEXT SESSION: Hip strengthening, ROM of Lumbar spine, balance  Minor Amble, LPTA/CLT; CBIS [phone]  1:37 PM, 02/28/24

## 2024-03-03 ENCOUNTER — Encounter (HOSPITAL_COMMUNITY): Admitting: Physical Therapy

## 2024-03-05 ENCOUNTER — Ambulatory Visit (HOSPITAL_COMMUNITY)
Admission: RE | Admit: 2024-03-05 | Discharge: 2024-03-05 | Disposition: A | Discharge status: Home / Self Care | Source: Ambulatory Visit | Attending: Pulmonary Disease | Admitting: Pulmonary Disease

## 2024-03-05 DIAGNOSIS — R0600 Dyspnea, unspecified: Secondary | ICD-10-CM | POA: Diagnosis not present

## 2024-03-05 LAB — PULMONARY FUNCTION TEST
DL/VA % pred: 103 %
DL/VA: 4.02 ml/min/mmHg/L
DLCO unc % pred: 78 %
DLCO unc: 17.8 ml/min/mmHg
FEF 25-75 Post: 2.99 L/s
FEF 25-75 Pre: 1.91 L/s
FEF2575-%Change-Post: 56 %
FEF2575-%Pred-Post: 162 %
FEF2575-%Pred-Pre: 103 %
FEV1-%Change-Post: 7 %
FEV1-%Pred-Post: 87 %
FEV1-%Pred-Pre: 81 %
FEV1-Post: 2.33 L
FEV1-Pre: 2.16 L
FEV1FVC-%Change-Post: 6 %
FEV1FVC-%Pred-Pre: 108 %
FEV6-%Change-Post: 2 %
FEV6-%Pred-Post: 81 %
FEV6-%Pred-Pre: 78 %
FEV6-Post: 2.82 L
FEV6-Pre: 2.75 L
FEV6FVC-%Change-Post: 0 %
FEV6FVC-%Pred-Post: 107 %
FEV6FVC-%Pred-Pre: 106 %
FVC-%Change-Post: 1 %
FVC-%Pred-Post: 75 %
FVC-%Pred-Pre: 74 %
FVC-Post: 2.82 L
FVC-Pre: 2.77 L
Post FEV1/FVC ratio: 83 %
Post FEV6/FVC ratio: 100 %
Pre FEV1/FVC ratio: 78 %
Pre FEV6/FVC Ratio: 99 %
RV % pred: 93 %
RV: 2.37 L
TLC % pred: 77 %
TLC: 5.17 L

## 2024-03-05 MED ORDER — ALBUTEROL SULFATE (2.5 MG/3ML) 0.083% IN NEBU
2.5000 mg | INHALATION_SOLUTION | Freq: Once | RESPIRATORY_TRACT | Status: AC
  Administered 2024-03-05: 2.5 mg via RESPIRATORY_TRACT

## 2024-03-06 ENCOUNTER — Encounter (HOSPITAL_COMMUNITY): Payer: Self-pay

## 2024-03-06 ENCOUNTER — Ambulatory Visit (HOSPITAL_COMMUNITY)

## 2024-03-06 DIAGNOSIS — M6281 Muscle weakness (generalized): Secondary | ICD-10-CM

## 2024-03-06 DIAGNOSIS — M25551 Pain in right hip: Secondary | ICD-10-CM | POA: Diagnosis not present

## 2024-03-06 DIAGNOSIS — Z9181 History of falling: Secondary | ICD-10-CM | POA: Diagnosis not present

## 2024-03-06 DIAGNOSIS — M5442 Lumbago with sciatica, left side: Secondary | ICD-10-CM

## 2024-03-06 NOTE — Therapy (Signed)
 OUTPATIENT PHYSICAL THERAPY THORACOLUMBAR TREATMENT   Patient Name: RONDLE SEHGAL MRN: [id number] DOB:Jun 09, 1946, 78 y.o., male Today's Date: 03/06/2024  Progress Note Reporting Period 02/07/24 to 03/06/24  See note below for Objective Data and Assessment of Progress/Goals.      END OF SESSION:  PT End of Session - 03/06/24 1021     Visit Number 5    Number of Visits 13    Date for PT Re-Evaluation 04/03/24    Authorization Type [employer]    Authorization Time Period Seeking new auth    Progress Note Due on Visit 10    PT Start Time 1020    PT Stop Time 1100    PT Time Calculation (min) 40 min    Activity Tolerance Patient tolerated treatment well    Behavior During Therapy [facility] 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) 10/07/2023   Difficulty walking 04/07/2014   IMO SNOMED Dx Update Oct 2024     Diverticulosis    Essential (primary) hypertension 10/07/2023   Gastroesophageal reflux disease without esophagitis 10/07/2023   Gout    Hemifacial spasm    left side affected takes Artane  daily;    Hemifacial spasm of left side of face 05/18/2022   Hemorrhoids 09/11/2022   History of bronchitis 11/2013   Hypertension    takes Ziac  daily   Impairment of balance 04/07/2014   Joint pain    Memory loss    Primary osteoarthritis 10/07/2023   Primary osteoarthritis of hip 08/26/2018   Proximal leg weakness 04/07/2014   Type 2 diabetes mellitus with diabetic neuropathy (HCC) 10/07/2023   Past Surgical History:  Procedure Laterality Date   CATARACT EXTRACTION, BILATERAL Bilateral 2015, 2016   COLONOSCOPY     COLONOSCOPY N/A 12/18/2017   diverticulosis and internal hemorhoids.   COLONOSCOPY WITH PROPOFOL  N/A 08/07/2022   Procedure: COLONOSCOPY WITH PROPOFOL ;  Surgeon: Vinetta Greening, DO;  Location: [facility];   Service: Endoscopy;  Laterality: N/A;  11:00 am   eye muschle surgeries  as a child   x 5   HERNIA REPAIR Left    as a child-inguinal;right inguinal in 1969   POLYPECTOMY  08/07/2022   Procedure: POLYPECTOMY;  Surgeon: Vinetta Greening, DO;  Location: [facility];  Service: Endoscopy;;   TOTAL HIP ARTHROPLASTY Right 03/24/2014   Procedure: TOTAL HIP ARTHROPLASTY ANTERIOR APPROACH;  Surgeon: Ferd Householder, MD;  Location: [facility];  Service: Orthopedics;  Laterality: Right;   TOTAL HIP ARTHROPLASTY Left 08/26/2018   Procedure: LEFT TOTAL HIP ARTHROPLASTY ANTERIOR APPROACH;  Surgeon: Saundra Curl, MD;  Location: [facility];  Service: Orthopedics;  Laterality: Left;   Patient Active Problem List   Diagnosis Date Noted   Pulmonary hypertension, unspecified (HCC) 11/24/2023   Coronary artery calcification 11/24/2023   Dizziness 11/24/2023   Aortic dilatation (HCC) 11/24/2023   Type 2 diabetes mellitus with diabetic neuropathy (HCC) 10/07/2023   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) 10/07/2023   Essential (primary) hypertension 10/07/2023   Gastroesophageal reflux disease without esophagitis 10/07/2023   Primary osteoarthritis 10/07/2023   Hemorrhoids 09/11/2022   Hemifacial spasm of left side of face 05/18/2022   Cognitive impairment 05/08/2022   Primary osteoarthritis of hip 08/26/2018   Proximal leg weakness 04/07/2014   Difficulty walking 04/07/2014  Impairment of balance 04/07/2014   Primary localized osteoarthrosis, pelvic region and thigh 03/24/2014    PCP: Kathyleen Parkins, MD  REFERRING PROVIDER: Ward Guy, DO  REFERRING DIAG: 'Lumbago'  Rationale for Evaluation and Treatment: Rehabilitation  THERAPY DIAG:  Acute left-sided low back pain with left-sided sciatica  Muscle weakness (generalized)  ONSET DATE: ~ 2 weeks  SUBJECTIVE:                                                                                                                                                                                            SUBJECTIVE STATEMENT: Pt reports he's a little tired today. He didn't go to sleep until 4 am. Just couldn't get to sleep. Reports HEP still going well. Sometimes he cuts them short but still doing them .    Pt reports that about 2 weeks ago, after using his chainsaw  and noticed the pain that runs down the back down to his ankle. Currently used pain medications. Worst 6/10 pain. Pt's wife reports that the pt reported that his pain is less in the morning and gets worse throughout the day.  PERTINENT HISTORY:  Bilateral hip replacements  PAIN:  Are you having pain? Yes: NPRS scale: 6/10 Pain location: L buttocks and runs down his LLE Pain description: Radiating, dull and mashing on the L hip Aggravating factors: "all the time" Relieving factors: pain medications, relieving recliner  PRECAUTIONS: None  RED FLAGS: None   WEIGHT BEARING RESTRICTIONS: No  FALLS:  Has patient fallen in last 6 months? No   PATIENT GOALS: "getting out of pain"  OBJECTIVE:  Note: Objective measures were completed at Evaluation unless otherwise noted.  DIAGNOSTIC FINDINGS:    PATIENT SURVEYS:  Modified Oswestry 14/50 = 28%   03/06/24: Modified Oswestry Low Back Pain Disability Questionnaire: 10 / 50 = 20.0 %  COGNITION: Overall cognitive status: Within functional limits for tasks assessed     SENSATION: WFL  POSTURE: rounded shoulders, forward head, increased lumbar lordosis, and posterior pelvic tilt  PALPATION: Hypomobile in CPAs and UPAs throughout Lumbar spine; unable to reproduce concordant pain  LUMBAR ROM:   AROM eval 03/06/24  Flexion 75%  75%  Extension 75% 75%  Right lateral flexion 75% 75%  Left lateral flexion 75% * and "something is not right" 75%  Right rotation 75% 75%  Left rotation 75% 75%   (Blank rows = not tested)  LOWER EXTREMITY ROM:     Active  Right eval Left eval  Hip flexion    Hip  extension    Hip abduction    Hip adduction    Hip  internal rotation    Hip external rotation    Knee flexion    Knee extension    Ankle dorsiflexion    Ankle plantarflexion    Ankle inversion    Ankle eversion     (Blank rows = not tested)  LOWER EXTREMITY MMT:    MMT Right eval Left eval Right  03/06/24 Left  03/06/24  Hip flexion 3+ 3+ 4 4-  Hip extension 3+ 3+ 4 4-  Hip abduction 3+ 3+ 4 4-  Hip adduction      Hip internal rotation      Hip external rotation      Knee flexion      Knee extension 4 4 4+ 4+  Ankle dorsiflexion 4 4 4+ 4+  Ankle plantarflexion      Ankle inversion      Ankle eversion       (Blank rows = not tested)  LUMBAR SPECIAL TESTS:  Straight leg raise test: Negative, Slump test: Negative, Quadrant test: Negative, and Single leg stance test: Negative  FUNCTIONAL TESTS:  .30 Second Chair Stand Test: 9 Norms:  Age 59-64 41-69 70-74 75-79 80-84 85-89 90-94  Women 15 15 14 13 12 11 9   Men 17 16 15 14 13 11 9    L SLS: Unable  R SLS: Unable  Norms: 18-39  F: 43.5 seconds  M: 43.2 seconds 40-49  F: 40.4 seconds  M: 40.1 seconds 50-59  F: 36 seconds  M: 38.1 seconds 60-69  F: 25.1 seconds  M: 28.7 seconds 70-79  F: 11.3 seconds  M: 18.3 seconds  2 minute walk test: To initiate next session  03/06/24: 30 sec chair rise test: 11 STS, no UE use SLS: R - 3" ,  L - 2"  GAIT: Distance walked: 53ft Assistive device utilized: None Level of assistance: Complete Independence Comments: Antalgia with LLE and bilatearl trendelenberg. Pt with decreased LLE step length and stance phase.   TREATMENT DATE:  03/06/24: Progress Note:  Modified Oswestry Lumbar ROM LE MMT 30 sec STS SL balance  Tandem walking on aeromat in // bars, down&back 1x, CGA Side Stepping on aeromat in // bars, down&back 1x, CGA STS feet on aeromat, 8x  02/28/24 Standing in // bars  STS no HHA eccentric control 10x  Marching alternating 12x 5"   Hip abduction 2x  12 each LE RTB.  Sidestep with RTB around thigh 4RT inside //bars hands hovering over bars  Tandem stance 3x 30" with 10-20" max before need for UE to steady himself  Squat front of chair 2x 10  SLS 1-2" max   Vector stance 5x 5" heel raises on incline 20X  Toe raises on decline 20X -postural strengthening RTB shoulder extension and rows 15x each with tactile cueing  02/26/24 Standing:  in parallel bars: heel raises on incline 20X  Toe raises on decline 20X  Hip abduction 2X10 each LE  Hip extension 2X10 each LE  Lunges onto 4" step 2X10 each LE with no UE assist for stability  Tandem stance 2X30" each LE lead with 10-20" max before having to use UE to steady himself  Vector stance 10X3"   SLS each LE  Sit to stand no UE 5 reps  02/18/24: Review of HEP Bridges: 2x10 Clamshells: 2x10 Side steps: 20 ftx4, v cues for inc step length Hip extension at // bars: 2x10,  STS: 10x2, reports fatigue at  rep 3 or 4  DGI 1. Gait level surface (3) Normal: Walks  20', no assistive devices, good sped, no evidence for imbalance, normal gait pattern 2. Change in gait speed (2) Mild Impairment: Is able to change speed but demonstrates mild gait deviations, or not gait deviations but unable to achieve a significant change in velocity, or uses an assistive device. 3. Gait with horizontal head turns (2) Mild Impairment: Performs head turns smoothly with slight change in gait velocity, i.e., minor disruption to smooth gait path or uses walking aid. 4. Gait with vertical head turns (2) Mild Impairment: Performs head turns smoothly with slight change in gait velocity, i.e., minor disruption to smooth gait path or uses walking aid. 5. Gait and pivot turn (1) Moderate Impairment: Turns slowly, requires verbal cueing, requires several small steps to catch balance following turn and stop. 6. Step over obstacle (2) Mild Impairment: Is able to step over box, but must slow down and adjust steps to clear box  safely. 7. Step around obstacles (2) Mild Impairment: Is able to step around both cones, but must slow down and adjust steps to clear cones. 8. Stairs (2) Mild Impairment: Alternating feet, must use rail.  TOTAL SCORE: 16 / 24  Tandem balance  RLE leading : 10"  LLE leading : 4" SLS:  LLE; 3"  RLE: 2"    03/06/2024 Evaluation and HEP below Supine bridge x 15 Sidelying Clamshell x15                                                                                                                                 PATIENT EDUCATION:  Education details: PT Evaluation, findings, prognosis, frequency, attendance policy, and HEP. Person educated: Patient Education method: Medical illustrator Education comprehension: verbalized understanding  HOME EXERCISE PROGRAM: Access Code: [email or web address] URL: [email or web address] Date: 02/07/2024 Prepared by: Irene Mannheim  Exercises - Supine Bridge  - 1 x daily - 7 x weekly - 3 sets - 12-15 reps - 3 hold - Clamshell  - 1 x daily - 7 x weekly - 3 sets - 12-15 reps - 3 hold  -Single leg balance with counter support -Side stepping   ASSESSMENT:  CLINICAL IMPRESSION: Progress Note/Re-eval performed on this date. Patient demonstrating improvements with LE strength, self-perceived function and during 30 sec STS test. Patient still with deficits in single leg balance and dynamic balance as observed during remainder of session with balancing on aeromat with pt requiring UE support t/o and CGA consistently. Patient also demonstrated dec velocity and very short step lengths bilaterally. Patient will benefit from continued skilled physical therapy in order to address remaining deficts in order to improve function and QOL.    Patient is a 78 y.o. male who was seen today for physical therapy evaluation and treatment for 'Lumbago.' Unable to reproduce concordant pain but reports LLE radiating pain and major balance deficits. Pt  demonstrating reduced Lumbar ROM, hip and BLE muscle weakness, abnormal gait, balance deficits and pain limiting pt's functional mobility, transfers, ADLs  and desired functional dynamic activities. Pt will benefit from skilled Physical Therapy services to address deficits/limitations in order to improve functional and QOL.    OBJECTIVE IMPAIRMENTS: Abnormal gait, decreased activity tolerance, decreased balance, decreased endurance, decreased mobility, decreased ROM, decreased strength, hypomobility, increased fascial restrictions, postural dysfunction, and pain.   ACTIVITY LIMITATIONS: carrying, lifting, bending, standing, squatting, stairs, bed mobility, and reach over head  PARTICIPATION LIMITATIONS: cleaning, laundry, driving, shopping, community activity, occupation, and yard work  PERSONAL FACTORS: Age and Past/current experiences are also affecting patient's functional outcome.   REHAB POTENTIAL: Good  CLINICAL DECISION MAKING: Evolving/moderate complexity  EVALUATION COMPLEXITY: Low   GOALS: Goals reviewed with patient? No  SHORT TERM GOALS: Target date: 02/28/24  Pt will be independent with HEP in order to demonstrate participation in Physical Therapy POC.  Baseline: Goal status: IN PROGRESS  2.  Pt will report 5/10 pain with mobility in order to demonstrate improved pain with ADLs.  Baseline:  Goal status: IN PROGRESS  LONG TERM GOALS: Target date: 03/20/24  Pt will improve 30 Second Chair Stand Test by at least 2 reps in order to demonstrate improved functional strength to return to desired activities.  Baseline: see objective.  Goal status: MET  2.  Pt will improve Single Leg balance by at least 5 seconds bilaterally in order to demonstrate improved functional safety during community setting Baseline: see objective.  Goal status: IN PROGRESS  3.  Pt will improve Modified Oswestry score by 8 points in order to demonstrate improved pain with functional goals and  outcomes. Baseline: see objective.  Goal status: IN PROGRESS  4.  Pt will report 3/10 pain with mobility in order to demonstrate reduced pain with ADLs lasting greater than 30 minutes.  Baseline: see objective.  Goal status: IN PROGRESS  PLAN:  PT FREQUENCY: 2x/week  PT DURATION: 6 weeks  PLANNED INTERVENTIONS: 97164- PT Re-evaluation, 97750- Physical Performance Testing, 97110-Therapeutic exercises, 97530- Therapeutic activity, [id number]- Neuromuscular re-education, 97535- Self Care, [id number]- Manual therapy, [phone]- Gait training, [phone]- Electrical stimulation (unattended), [phone]- Electrical stimulation (manual), [id number]- Traction (mechanical), Patient/Family education, Balance training, Stair training, Taping, Dry Needling, Joint mobilization, Joint manipulation, Spinal manipulation, Spinal mobilization, Cryotherapy, and Moist heat.  PLAN FOR NEXT SESSION: Hip strengthening, ROM of Lumbar spine, balance  1:00 PM, 03/06/24 Raphaella Larkin Powell-Butler, PT, DPT [facility] [place]

## 2024-03-11 ENCOUNTER — Ambulatory Visit (HOSPITAL_COMMUNITY)

## 2024-03-11 ENCOUNTER — Encounter (HOSPITAL_COMMUNITY): Payer: Self-pay

## 2024-03-11 DIAGNOSIS — M5442 Lumbago with sciatica, left side: Secondary | ICD-10-CM

## 2024-03-11 DIAGNOSIS — M6281 Muscle weakness (generalized): Secondary | ICD-10-CM

## 2024-03-11 DIAGNOSIS — Z9181 History of falling: Secondary | ICD-10-CM | POA: Diagnosis not present

## 2024-03-11 DIAGNOSIS — M25551 Pain in right hip: Secondary | ICD-10-CM | POA: Diagnosis not present

## 2024-03-11 NOTE — Therapy (Signed)
 OUTPATIENT PHYSICAL THERAPY THORACOLUMBAR TREATMENT   Patient Name: Erik Odonnell MRN: [id number] DOB:1946-06-19, 78 y.o., male Today's Date: 03/11/2024  Progress Note Reporting Period 02/07/24 to 03/06/24  See note below for Objective Data and Assessment of Progress/Goals.      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) 10/07/2023   Difficulty walking 04/07/2014   IMO SNOMED Dx Update Oct 2024     Diverticulosis    Essential (primary) hypertension 10/07/2023   Gastroesophageal reflux disease without esophagitis 10/07/2023   Gout    Hemifacial spasm    left side affected takes Artane  daily;    Hemifacial spasm of left side of face 05/18/2022   Hemorrhoids 09/11/2022   History of bronchitis 11/2013   Hypertension    takes Ziac  daily   Impairment of balance 04/07/2014   Joint pain    Memory loss    Primary osteoarthritis 10/07/2023   Primary osteoarthritis of hip 08/26/2018   Proximal leg weakness 04/07/2014   Type 2 diabetes mellitus with diabetic neuropathy (HCC) 10/07/2023   Past Surgical History:  Procedure Laterality Date   CATARACT EXTRACTION, BILATERAL Bilateral 2015, 2016   COLONOSCOPY     COLONOSCOPY N/A 12/18/2017   diverticulosis and internal hemorhoids.   COLONOSCOPY WITH PROPOFOL  N/A 08/07/2022   Procedure: COLONOSCOPY WITH PROPOFOL ;  Surgeon: Vinetta Greening, DO;  Location: [facility];   Service: Endoscopy;  Laterality: N/A;  11:00 am   eye muschle surgeries  as a child   x 5   HERNIA REPAIR Left    as a child-inguinal;right inguinal in 1969   POLYPECTOMY  08/07/2022   Procedure: POLYPECTOMY;  Surgeon: Vinetta Greening, DO;  Location: [facility];  Service: Endoscopy;;   TOTAL HIP ARTHROPLASTY Right 03/24/2014   Procedure: TOTAL HIP ARTHROPLASTY ANTERIOR APPROACH;  Surgeon: Ferd Householder, MD;  Location: [facility] OR;  Service: Orthopedics;  Laterality: Right;   TOTAL HIP ARTHROPLASTY Left 08/26/2018   Procedure: LEFT TOTAL HIP ARTHROPLASTY ANTERIOR APPROACH;  Surgeon: Saundra Curl, MD;  Location: [facility];  Service: Orthopedics;  Laterality: Left;   Patient Active Problem List   Diagnosis Date Noted   Pulmonary hypertension, unspecified (HCC) 11/24/2023   Coronary artery calcification 11/24/2023   Dizziness 11/24/2023   Aortic dilatation (HCC) 11/24/2023   Type 2 diabetes mellitus with diabetic neuropathy (HCC) 10/07/2023   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) 10/07/2023   Essential (primary) hypertension 10/07/2023   Gastroesophageal reflux disease without esophagitis 10/07/2023   Primary osteoarthritis 10/07/2023   Hemorrhoids 09/11/2022   Hemifacial spasm of left side of face 05/18/2022   Cognitive impairment 05/08/2022   Primary osteoarthritis of hip 08/26/2018   Proximal leg weakness 04/07/2014   Difficulty walking 04/07/2014  Impairment of balance 04/07/2014   Primary localized osteoarthrosis, pelvic region and thigh 03/24/2014    PCP: Kathyleen Parkins, MD  REFERRING PROVIDER: Ward Guy, DO  REFERRING DIAG: 'Lumbago'  Rationale for Evaluation and Treatment: Rehabilitation  THERAPY DIAG:  Acute left-sided low back pain with left-sided sciatica  Muscle weakness (generalized)  ONSET DATE: ~ 2 weeks  SUBJECTIVE:                                                                                                                                                                                            SUBJECTIVE STATEMENT: Pt reports he's feeling good today. No reports of pain other than a spot on his big toe that he's had for a week. Reports it looks no different than the other ones. Reports he hasn't had any leg pain recently. Reports also daily pain 0/10 and has been for a few weeks.    Pt reports that about 2 weeks ago, after using his chainsaw  and noticed the pain that runs down the back down to his ankle. Currently used pain medications. Worst 6/10 pain. Pt's wife reports that the pt reported that his pain is less in the morning and gets worse throughout the day.  PERTINENT HISTORY:  Bilateral hip replacements  PAIN:  Are you having pain? Yes: NPRS scale: 6/10 Pain location: L buttocks and runs down his LLE Pain description: Radiating, dull and mashing on the L hip Aggravating factors: "all the time" Relieving factors: pain medications, relieving recliner  PRECAUTIONS: None  RED FLAGS: None   WEIGHT BEARING RESTRICTIONS: No  FALLS:  Has patient fallen in last 6 months? No   PATIENT GOALS: "getting out of pain"  OBJECTIVE:  Note: Objective measures were completed at Evaluation unless otherwise noted.  DIAGNOSTIC FINDINGS:    PATIENT SURVEYS:  Modified Oswestry 14/50 = 28%   03/06/24: Modified Oswestry Low Back Pain Disability Questionnaire: 10 / 50 = 20.0 %  COGNITION: Overall cognitive status: Within functional limits for tasks assessed     SENSATION: WFL  POSTURE: rounded shoulders, forward head, increased lumbar lordosis, and posterior pelvic tilt  PALPATION: Hypomobile in CPAs and UPAs throughout Lumbar spine; unable to reproduce concordant pain  LUMBAR ROM:   AROM eval 03/06/24  Flexion 75%  75%  Extension 75% 75%  Right lateral flexion 75% 75%  Left lateral flexion 75% * and "something is not right" 75%  Right rotation 75% 75%  Left rotation 75% 75%   (Blank rows = not  tested)  LOWER EXTREMITY ROM:     Active  Right eval Left eval  Hip flexion  Hip extension    Hip abduction    Hip adduction    Hip internal rotation    Hip external rotation    Knee flexion    Knee extension    Ankle dorsiflexion    Ankle plantarflexion    Ankle inversion    Ankle eversion     (Blank rows = not tested)  LOWER EXTREMITY MMT:    MMT Right eval Left eval Right  03/06/24 Left  03/06/24  Hip flexion 3+ 3+ 4 4-  Hip extension 3+ 3+ 4 4-  Hip abduction 3+ 3+ 4 4-  Hip adduction      Hip internal rotation      Hip external rotation      Knee flexion      Knee extension 4 4 4+ 4+  Ankle dorsiflexion 4 4 4+ 4+  Ankle plantarflexion      Ankle inversion      Ankle eversion       (Blank rows = not tested)  LUMBAR SPECIAL TESTS:  Straight leg raise test: Negative, Slump test: Negative, Quadrant test: Negative, and Single leg stance test: Negative  FUNCTIONAL TESTS:  .30 Second Chair Stand Test: 9 Norms:  Age 36-64 27-69 70-74 75-79 80-84 85-89 90-94  Women 15 15 14 13 12 11 9   Men 17 16 15 14 13 11 9    L SLS: Unable  R SLS: Unable  Norms: 18-39  F: 43.5 seconds  M: 43.2 seconds 40-49  F: 40.4 seconds  M: 40.1 seconds 50-59  F: 36 seconds  M: 38.1 seconds 60-69  F: 25.1 seconds  M: 28.7 seconds 70-79  F: 11.3 seconds  M: 18.3 seconds  2 minute walk test: To initiate next session  03/06/24: 30 sec chair rise test: 11 STS, no UE use SLS: R - 3" ,  L - 2"  DGI 1. Gait level surface (3) Normal: Walks 20', no assistive devices, good sped, no evidence for imbalance, normal gait pattern 2. Change in gait speed (2) Mild Impairment: Is able to change speed but demonstrates mild gait deviations, or not gait deviations but unable to achieve a significant change in velocity, or uses an assistive device. 3. Gait with horizontal head turns (2) Mild Impairment: Performs head turns smoothly with slight change in gait velocity, i.e., minor  disruption to smooth gait path or uses walking aid. 4. Gait with vertical head turns (2) Mild Impairment: Performs head turns smoothly with slight change in gait velocity, i.e., minor disruption to smooth gait path or uses walking aid. 5. Gait and pivot turn (1) Moderate Impairment: Turns slowly, requires verbal cueing, requires several small steps to catch balance following turn and stop. 6. Step over obstacle (2) Mild Impairment: Is able to step over box, but must slow down and adjust steps to clear box safely. 7. Step around obstacles (2) Mild Impairment: Is able to step around both cones, but must slow down and adjust steps to clear cones. 8. Stairs (2) Mild Impairment: Alternating feet, must use rail.  TOTAL SCORE: 16 / 24  GAIT: Distance walked: 76ft Assistive device utilized: None Level of assistance: Complete Independence Comments: Antalgia with LLE and bilatearl trendelenberg. Pt with decreased LLE step length and stance phase.   TREATMENT DATE:  03/11/24: NuStep, seat 9, 5', lvl 2 Forward Steps, 6 inch step, 10x, CGA, no UE support Lateral step overs, 5x, CGA, no UE support Forward, lateral and backward foot steps, feet centered on X, 10x each LE  Marching in // bars, 5 lb. AW, 1'x2 STS w/ 10lb tidal tank, 10x1, 15x1 Hamstring stretch, 2x30"  03/06/24: Progress Note:  Modified Oswestry Lumbar ROM LE MMT 30 sec STS SL balance  Tandem walking on aeromat in // bars, down&back 1x, CGA Side Stepping on aeromat in // bars, down&back 1x, CGA STS feet on aeromat, 8x  02/28/24 Standing in // bars  STS no HHA eccentric control 10x  Marching alternating 12x 5"   Hip abduction 2x 12 each LE RTB.  Sidestep with RTB around thigh 4RT inside //bars hands hovering over bars  Tandem stance 3x 30" with 10-20" max before need for UE to steady himself  Squat front of chair 2x 10  SLS 1-2" max   Vector stance 5x 5" heel raises on incline 20X  Toe raises on decline 20X -postural  strengthening RTB shoulder extension and rows 15x each with tactile cueing  02/26/24 Standing:  in parallel bars: heel raises on incline 20X  Toe raises on decline 20X  Hip abduction 2X10 each LE  Hip extension 2X10 each LE  Lunges onto 4" step 2X10 each LE with no UE assist for stability  Tandem stance 2X30" each LE lead with 10-20" max before having to use UE to steady himself  Vector stance 10X3"   SLS each LE  Sit to stand no UE 5 reps     PATIENT EDUCATION:  Education details: PT Evaluation, findings, prognosis, frequency, attendance policy, and HEP. Person educated: Patient Education method: Medical illustrator Education comprehension: verbalized understanding  HOME EXERCISE PROGRAM: Access Code: [email or web address] URL: [email or web address] Date: 02/07/2024 Prepared by: Irene Mannheim  Exercises - Supine Bridge  - 1 x daily - 7 x weekly - 3 sets - 12-15 reps - 3 hold - Clamshell  - 1 x daily - 7 x weekly - 3 sets - 12-15 reps - 3 hold  -Single leg balance with counter support -Side stepping   ASSESSMENT:  CLINICAL IMPRESSION: Patient tolerated session well on this date. Began session on NuStep at increased level, with cueing to keep spm above 80. Followed with forward and lateral step overs. CGA t/o for mild imbalance. Followed with multi-direction foot steps, tried taps but unable to safely. With foot steps, patient loses balance in lateral directions requiring min A at times. Remainder of session spent with LE strengthening, balance, and stretching. Patient will benefit from continued skilled physical therapy in order to address remaining deficts in order to improve function and QOL.    Patient is a 78 y.o. male who was seen today for physical therapy evaluation and treatment for 'Lumbago.' Unable to reproduce concordant pain but reports LLE radiating pain and major balance deficits. Pt demonstrating reduced Lumbar ROM, hip and BLE muscle weakness,  abnormal gait, balance deficits and pain limiting pt's functional mobility, transfers, ADLs and desired functional dynamic activities. Pt will benefit from skilled Physical Therapy services to address deficits/limitations in order to improve functional and QOL.    OBJECTIVE IMPAIRMENTS: Abnormal gait, decreased activity tolerance, decreased balance, decreased endurance, decreased mobility, decreased ROM, decreased strength, hypomobility, increased fascial restrictions, postural dysfunction, and pain.   ACTIVITY LIMITATIONS: carrying, lifting, bending, standing, squatting, stairs, bed mobility, and reach over head  PARTICIPATION LIMITATIONS: cleaning, laundry, driving, shopping, community activity, occupation, and yard work  PERSONAL FACTORS: Age and Past/current experiences are also affecting patient's functional outcome.   REHAB POTENTIAL: Good  CLINICAL DECISION MAKING: Evolving/moderate complexity  EVALUATION COMPLEXITY: Low   GOALS: Goals reviewed with patient?  No  SHORT TERM GOALS: Target date: 02/28/24  Pt will be independent with HEP in order to demonstrate participation in Physical Therapy POC.  Baseline: Goal status: IN PROGRESS  2.  Pt will report 5/10 pain with mobility in order to demonstrate improved pain with ADLs.  Baseline:  Goal status: IN PROGRESS  LONG TERM GOALS: Target date: 03/20/24  Pt will improve 30 Second Chair Stand Test by at least 2 reps in order to demonstrate improved functional strength to return to desired activities.  Baseline: see objective.  Goal status: MET  2.  Pt will improve Single Leg balance by at least 5 seconds bilaterally in order to demonstrate improved functional safety during community setting Baseline: see objective.  Goal status: IN PROGRESS  3.  Pt will improve Modified Oswestry score by 8 points in order to demonstrate improved pain with functional goals and outcomes. Baseline: see objective.  Goal status: IN PROGRESS  4.   Pt will report 3/10 pain with mobility in order to demonstrate reduced pain with ADLs lasting greater than 30 minutes.  Baseline: see objective.  Goal status: IN PROGRESS  PLAN:  PT FREQUENCY: 2x/week  PT DURATION: 6 weeks  PLANNED INTERVENTIONS: 97164- PT Re-evaluation, 97750- Physical Performance Testing, 97110-Therapeutic exercises, 97530- Therapeutic activity, [id number]- Neuromuscular re-education, 97535- Self Care, [id number]- Manual therapy, [phone]- Gait training, [phone]- Electrical stimulation (unattended), [phone]- Electrical stimulation (manual), [id number]- Traction (mechanical), Patient/Family education, Balance training, Stair training, Taping, Dry Needling, Joint mobilization, Joint manipulation, Spinal manipulation, Spinal mobilization, Cryotherapy, and Moist heat.  PLAN FOR NEXT SESSION: Hip strengthening, ROM of Lumbar spine, balance    11:02 AM, 03/11/24 Marysue Sola, PT, DPT [facility] [place]

## 2024-03-12 ENCOUNTER — Ambulatory Visit (HOSPITAL_COMMUNITY): Admitting: Physical Therapy

## 2024-03-12 DIAGNOSIS — M6281 Muscle weakness (generalized): Secondary | ICD-10-CM | POA: Diagnosis not present

## 2024-03-12 DIAGNOSIS — M5442 Lumbago with sciatica, left side: Secondary | ICD-10-CM | POA: Diagnosis not present

## 2024-03-12 DIAGNOSIS — Z9181 History of falling: Secondary | ICD-10-CM | POA: Diagnosis not present

## 2024-03-12 DIAGNOSIS — M25551 Pain in right hip: Secondary | ICD-10-CM | POA: Diagnosis not present

## 2024-03-12 NOTE — Therapy (Signed)
 OUTPATIENT PHYSICAL THERAPY THORACOLUMBAR TREATMENT   Patient Name: Erik Odonnell MRN: [id number] DOB:10-12-1946, 78 y.o., male Today's Date: 03/12/2024    END OF SESSION:  PT End of Session - 03/12/24 1025     Visit Number 7    Number of Visits 13    Date for PT Re-Evaluation 04/03/24    Authorization Type [employer]    Authorization Time Period Seeking new auth    Progress Note Due on Visit 10    PT Start Time 1020    PT Stop Time 1100    PT Time Calculation (min) 40 min    Activity Tolerance Patient tolerated treatment well    Behavior During Therapy [facility] 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) 10/07/2023   Difficulty walking 04/07/2014   IMO SNOMED Dx Update Oct 2024     Diverticulosis    Essential (primary) hypertension 10/07/2023   Gastroesophageal reflux disease without esophagitis 10/07/2023   Gout    Hemifacial spasm    left side affected takes Artane  daily;    Hemifacial spasm of left side of face 05/18/2022   Hemorrhoids 09/11/2022   History of bronchitis 11/2013   Hypertension    takes Ziac  daily   Impairment of balance 04/07/2014   Joint pain    Memory loss    Primary osteoarthritis 10/07/2023   Primary osteoarthritis of hip 08/26/2018   Proximal leg weakness 04/07/2014   Type 2 diabetes mellitus with diabetic neuropathy (HCC) 10/07/2023   Past Surgical History:  Procedure Laterality Date   CATARACT EXTRACTION, BILATERAL Bilateral 2015, 2016   COLONOSCOPY     COLONOSCOPY N/A 12/18/2017   diverticulosis and internal hemorhoids.   COLONOSCOPY WITH PROPOFOL  N/A 08/07/2022   Procedure: COLONOSCOPY WITH PROPOFOL ;  Surgeon: Erik Greening, DO;  Location: [facility];  Service: Endoscopy;  Laterality: N/A;  11:00 am   eye muschle surgeries  as a child   x 5   HERNIA REPAIR Left    as a  child-inguinal;right inguinal in 1969   POLYPECTOMY  08/07/2022   Procedure: POLYPECTOMY;  Surgeon: Erik Greening, DO;  Location: [facility];  Service: Endoscopy;;   TOTAL HIP ARTHROPLASTY Right 03/24/2014   Procedure: TOTAL HIP ARTHROPLASTY ANTERIOR APPROACH;  Surgeon: Erik Householder, MD;  Location: [facility];  Service: Orthopedics;  Laterality: Right;   TOTAL HIP ARTHROPLASTY Left 08/26/2018   Procedure: LEFT TOTAL HIP ARTHROPLASTY ANTERIOR APPROACH;  Surgeon: Erik Curl, MD;  Location: [facility];  Service: Orthopedics;  Laterality: Left;   Patient Active Problem List   Diagnosis Date Noted   Pulmonary hypertension, unspecified (HCC) 11/24/2023   Coronary artery calcification 11/24/2023   Dizziness 11/24/2023   Aortic dilatation (HCC) 11/24/2023   Type 2 diabetes mellitus with diabetic neuropathy (HCC) 10/07/2023   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) 10/07/2023   Essential (primary) hypertension 10/07/2023   Gastroesophageal reflux disease without esophagitis 10/07/2023   Primary osteoarthritis 10/07/2023   Hemorrhoids 09/11/2022   Hemifacial spasm of left side of face 05/18/2022   Cognitive impairment 05/08/2022   Primary osteoarthritis of hip 08/26/2018   Proximal leg weakness 04/07/2014   Difficulty walking 04/07/2014   Impairment of balance 04/07/2014   Primary localized osteoarthrosis, pelvic region and thigh 03/24/2014    PCP: Erik Parkins, MD  REFERRING PROVIDER: Ward Guy, DO  REFERRING DIAG: 'Lumbago'  Rationale for Evaluation and Treatment: Rehabilitation  THERAPY DIAG:  Acute left-sided low back pain with left-sided sciatica  Muscle weakness (generalized)  ONSET DATE: ~ 2 weeks  SUBJECTIVE:                                                                                                                                                                                           SUBJECTIVE STATEMENT: Pt reports vast  improvement.  No longer having back pain.  Got on his tractor this morning and did some clearing for about an hour. No difficulty getting on and off his tractor.  States his great toe is no longer hurting him.   Pt reports that about 2 weeks ago, after using his chainsaw  and noticed the pain that runs down the back down to his ankle. Currently used pain medications. Worst 6/10 pain. Pt's wife reports that the pt reported that his pain is less in the morning and gets worse throughout the day.  PERTINENT HISTORY:  Bilateral hip replacements  PAIN:  Are you having pain? No  PRECAUTIONS: None  RED FLAGS: None   WEIGHT BEARING RESTRICTIONS: No  FALLS:  Has patient fallen in last 6 months? No   PATIENT GOALS: "getting out of pain"  OBJECTIVE:  Note: Objective measures were completed at Evaluation unless otherwise noted.  DIAGNOSTIC FINDINGS:    PATIENT SURVEYS:  Modified Oswestry 14/50 = 28%   03/06/24: Modified Oswestry Low Back Pain Disability Questionnaire: 10 / 50 = 20.0 %  COGNITION: Overall cognitive status: Within functional limits for tasks assessed     SENSATION: WFL  POSTURE: rounded shoulders, forward head, increased lumbar lordosis, and posterior pelvic tilt  PALPATION: Hypomobile in CPAs and UPAs throughout Lumbar spine; unable to reproduce concordant pain  LUMBAR ROM:   AROM eval 03/06/24  Flexion 75%  75%  Extension 75% 75%  Right lateral flexion 75% 75%  Left lateral flexion 75% * and "something is not right" 75%  Right rotation 75% 75%  Left rotation 75% 75%   (Blank rows = not tested)  LOWER EXTREMITY ROM:     Active  Right eval Left eval  Hip flexion    Hip extension    Hip abduction    Hip adduction    Hip internal rotation    Hip external rotation    Knee flexion    Knee extension    Ankle dorsiflexion    Ankle plantarflexion    Ankle inversion    Ankle eversion     (Blank rows = not tested)  LOWER  EXTREMITY MMT:    MMT  Right eval Left eval Right  03/06/24 Left  03/06/24  Hip flexion 3+ 3+ 4 4-  Hip extension 3+ 3+ 4 4-  Hip abduction 3+ 3+ 4 4-  Hip adduction      Hip internal rotation      Hip external rotation      Knee flexion      Knee extension 4 4 4+ 4+  Ankle dorsiflexion 4 4 4+ 4+  Ankle plantarflexion      Ankle inversion      Ankle eversion       (Blank rows = not tested)  LUMBAR SPECIAL TESTS:  Straight leg raise test: Negative, Slump test: Negative, Quadrant test: Negative, and Single leg stance test: Negative  FUNCTIONAL TESTS:  .30 Second Chair Stand Test: 9 Norms:  Age 71-64 61-69 70-74 75-79 80-84 85-89 90-94  Women 15 15 14 13 12 11 9   Men 17 16 15 14 13 11 9    L SLS: Unable  R SLS: Unable  Norms: 18-39  F: 43.5 seconds  M: 43.2 seconds 40-49  F: 40.4 seconds  M: 40.1 seconds 50-59  F: 36 seconds  M: 38.1 seconds 60-69  F: 25.1 seconds  M: 28.7 seconds 70-79  F: 11.3 seconds  M: 18.3 seconds  2 minute walk test: To initiate next session  03/06/24: 30 sec chair rise test: 11 STS, no UE use SLS: R - 3" ,  L - 2"  DGI 1. Gait level surface (3) Normal: Walks 20', no assistive devices, good sped, no evidence for imbalance, normal gait pattern 2. Change in gait speed (2) Mild Impairment: Is able to change speed but demonstrates mild gait deviations, or not gait deviations but unable to achieve a significant change in velocity, or uses an assistive device. 3. Gait with horizontal head turns (2) Mild Impairment: Performs head turns smoothly with slight change in gait velocity, i.e., minor disruption to smooth gait path or uses walking aid. 4. Gait with vertical head turns (2) Mild Impairment: Performs head turns smoothly with slight change in gait velocity, i.e., minor disruption to smooth gait path or uses walking aid. 5. Gait and pivot turn (1) Moderate Impairment: Turns slowly, requires verbal cueing, requires several small steps to catch balance following  turn and stop. 6. Step over obstacle (2) Mild Impairment: Is able to step over box, but must slow down and adjust steps to clear box safely. 7. Step around obstacles (2) Mild Impairment: Is able to step around both cones, but must slow down and adjust steps to clear cones. 8. Stairs (2) Mild Impairment: Alternating feet, must use rail.  TOTAL SCORE: 16 / 24  GAIT: Distance walked: 60ft Assistive device utilized: None Level of assistance: Complete Independence Comments: Antalgia with LLE and bilatearl trendelenberg. Pt with decreased LLE step length and stance phase.   TREATMENT DATE:  03/12/24 Nustep seat 9, level 3 UE/LE 5 minutes Standing:  alternating march with no UE assist 5# ankle weights 20X  Hip abduction 5# ankle weights 2X10  Hip extension 5# ankle weights 2X10  Lateral step overs, 6" step 10X with 1 UE assist  Forward step ups 6" 10X each lead with 1 UE assist  Tandem stance 30" X2 each LE  Vectors 10X3" each LE with 1 UE assist  03/11/24: NuStep, seat 9, 5', lvl 2 Forward Steps, 6 inch step, 10x, CGA, no UE support Lateral step overs, 5x, CGA, no UE support Forward, lateral  and backward foot steps, feet centered on X, 10x each LE Marching in // bars, 5 lb. AW, 1'x2 STS w/ 10lb tidal tank, 10x1, 15x1 Hamstring stretch, 2x30"  03/06/24: Progress Note:  Modified Oswestry Lumbar ROM LE MMT 30 sec STS SL balance  Tandem walking on aeromat in // bars, down&back 1x, CGA Side Stepping on aeromat in // bars, down&back 1x, CGA STS feet on aeromat, 8x  02/28/24 Standing in // bars  STS no HHA eccentric control 10x  Marching alternating 12x 5"   Hip abduction 2x 12 each LE RTB.  Sidestep with RTB around thigh 4RT inside //bars hands hovering over bars  Tandem stance 3x 30" with 10-20" max before need for UE to steady himself  Squat front of chair 2x 10  SLS 1-2" max   Vector stance 5x 5" heel raises on incline 20X  Toe raises on decline 20X -postural  strengthening RTB shoulder extension and rows 15x each with tactile cueing  02/26/24 Standing:  in parallel bars: heel raises on incline 20X  Toe raises on decline 20X  Hip abduction 2X10 each LE  Hip extension 2X10 each LE  Lunges onto 4" step 2X10 each LE with no UE assist for stability  Tandem stance 2X30" each LE lead with 10-20" max before having to use UE to steady himself  Vector stance 10X3"   SLS each LE  Sit to stand no UE 5 reps     PATIENT EDUCATION:  Education details: PT Evaluation, findings, prognosis, frequency, attendance policy, and HEP. Person educated: Patient Education method: Medical illustrator Education comprehension: verbalized understanding  HOME EXERCISE PROGRAM: Access Code: [email or web address] URL: [email or web address] Date: 02/07/2024 Prepared by: Irene Mannheim  Exercises - Supine Bridge  - 1 x daily - 7 x weekly - 3 sets - 12-15 reps - 3 hold - Clamshell  - 1 x daily - 7 x weekly - 3 sets - 12-15 reps - 3 hold  -Single leg balance with counter support -Side stepping   ASSESSMENT:  CLINICAL IMPRESSION: Continued with focus on improving LE strength, balance and core stabilization.  Began on NuStep and followed with standing challenges.  Able to complete hip strenghtening with 5# ankle weights.  Pt required cues to complete therex more slowly/controlled without using momentum to complete ROM.  Noted instability especially with step overs when attempting to complete without UE assist.  Added tandem stance with 10-15" max stance time without using UE to re-establish balance.  Added vectors to help improve LE stability and glut strength to increase balance.  Patient will benefit from continued skilled physical therapy in order to address remaining deficts in order to improve function and QOL.    Patient is a 78 y.o. male who was seen today for physical therapy evaluation and treatment for 'Lumbago.' Unable to reproduce concordant pain  but reports LLE radiating pain and major balance deficits. Pt demonstrating reduced Lumbar ROM, hip and BLE muscle weakness, abnormal gait, balance deficits and pain limiting pt's functional mobility, transfers, ADLs and desired functional dynamic activities. Pt will benefit from skilled Physical Therapy services to address deficits/limitations in order to improve functional and QOL.    OBJECTIVE IMPAIRMENTS: Abnormal gait, decreased activity tolerance, decreased balance, decreased endurance, decreased mobility, decreased ROM, decreased strength, hypomobility, increased fascial restrictions, postural dysfunction, and pain.   ACTIVITY LIMITATIONS: carrying, lifting, bending, standing, squatting, stairs, bed mobility, and reach over head  PARTICIPATION LIMITATIONS: cleaning, laundry, driving, shopping, community activity, occupation, and yard work  PERSONAL FACTORS:  Age and Past/current experiences are also affecting patient's functional outcome.   REHAB POTENTIAL: Good  CLINICAL DECISION MAKING: Evolving/moderate complexity  EVALUATION COMPLEXITY: Low   GOALS: Goals reviewed with patient? No  SHORT TERM GOALS: Target date: 02/28/24  Pt will be independent with HEP in order to demonstrate participation in Physical Therapy POC.  Baseline: Goal status: IN PROGRESS  2.  Pt will report 5/10 pain with mobility in order to demonstrate improved pain with ADLs.  Baseline:  Goal status: IN PROGRESS  LONG TERM GOALS: Target date: 03/20/24  Pt will improve 30 Second Chair Stand Test by at least 2 reps in order to demonstrate improved functional strength to return to desired activities.  Baseline: see objective.  Goal status: MET  2.  Pt will improve Single Leg balance by at least 5 seconds bilaterally in order to demonstrate improved functional safety during community setting Baseline: see objective.  Goal status: IN PROGRESS  3.  Pt will improve Modified Oswestry score by 8 points in order  to demonstrate improved pain with functional goals and outcomes. Baseline: see objective.  Goal status: IN PROGRESS  4.  Pt will report 3/10 pain with mobility in order to demonstrate reduced pain with ADLs lasting greater than 30 minutes.  Baseline: see objective.  Goal status: IN PROGRESS  PLAN:  PT FREQUENCY: 2x/week  PT DURATION: 6 weeks  PLANNED INTERVENTIONS: [id number]- PT Re-evaluation, 97750- Physical Performance Testing, 97110-Therapeutic exercises, 97530- Therapeutic activity, [id number]- Neuromuscular re-education, 97535- Self Care, [id number]- Manual therapy, [phone]- Gait training, [phone]- Electrical stimulation (unattended), [phone]- Electrical stimulation (manual), [id number]- Traction (mechanical), Patient/Family education, Balance training, Stair training, Taping, Dry Needling, Joint mobilization, Joint manipulation, Spinal manipulation, Spinal mobilization, Cryotherapy, and Moist heat.  PLAN FOR NEXT SESSION: Hip strengthening, ROM of Lumbar spine, balance    10:26 AM, 03/12/24 Lorenso Romance, PTA/CLT [facility] Ph: [phone]

## 2024-03-17 ENCOUNTER — Ambulatory Visit (HOSPITAL_COMMUNITY)

## 2024-03-17 ENCOUNTER — Encounter (HOSPITAL_COMMUNITY): Payer: Self-pay

## 2024-03-17 DIAGNOSIS — M25551 Pain in right hip: Secondary | ICD-10-CM | POA: Diagnosis not present

## 2024-03-17 DIAGNOSIS — M6281 Muscle weakness (generalized): Secondary | ICD-10-CM

## 2024-03-17 DIAGNOSIS — M5442 Lumbago with sciatica, left side: Secondary | ICD-10-CM

## 2024-03-17 DIAGNOSIS — Z9181 History of falling: Secondary | ICD-10-CM | POA: Diagnosis not present

## 2024-03-17 NOTE — Therapy (Signed)
 OUTPATIENT PHYSICAL THERAPY THORACOLUMBAR TREATMENT   Patient Name: Erik Odonnell MRN: [id number] DOB:[birth year], 78 y.o., male Today's Date: 03/17/2024    END OF SESSION:  PT End of Session - 03/17/24 1022     Visit Number 8    Number of Visits 13    Date for PT Re-Evaluation 04/03/24    Authorization Type [employer]    Authorization Time Period Seeking new auth    Progress Note Due on Visit 10    PT Start Time 1015    PT Stop Time 1055    PT Time Calculation (min) 40 min    Activity Tolerance Patient tolerated treatment well    Behavior During Therapy [facility] for tasks assessed/performed              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) 10/07/2023   Difficulty walking 04/07/2014   IMO SNOMED Dx Update Oct 2024     Diverticulosis    Essential (primary) hypertension 10/07/2023   Gastroesophageal reflux disease without esophagitis 10/07/2023   Gout    Hemifacial spasm    left side affected takes Artane  daily;    Hemifacial spasm of left side of face 05/18/2022   Hemorrhoids 09/11/2022   History of bronchitis 11/2013   Hypertension    takes Ziac  daily   Impairment of balance 04/07/2014   Joint pain    Memory loss    Primary osteoarthritis 10/07/2023   Primary osteoarthritis of hip 08/26/2018   Proximal leg weakness 04/07/2014   Type 2 diabetes mellitus with diabetic neuropathy (HCC) 10/07/2023   Past Surgical History:  Procedure Laterality Date   CATARACT EXTRACTION, BILATERAL Bilateral 2015, 2016   COLONOSCOPY     COLONOSCOPY N/A 12/18/2017   diverticulosis and internal hemorhoids.   COLONOSCOPY WITH PROPOFOL  N/A 08/07/2022   Procedure: COLONOSCOPY WITH PROPOFOL ;  Surgeon: Vinetta Greening, DO;  Location: [facility];  Service: Endoscopy;  Laterality: N/A;  11:00 am   eye muschle surgeries  as a child   x 5   HERNIA REPAIR Left    as a  child-inguinal;right inguinal in 1969   POLYPECTOMY  08/07/2022   Procedure: POLYPECTOMY;  Surgeon: Vinetta Greening, DO;  Location: [facility];  Service: Endoscopy;;   TOTAL HIP ARTHROPLASTY Right 03/24/2014   Procedure: TOTAL HIP ARTHROPLASTY ANTERIOR APPROACH;  Surgeon: Ferd Householder, MD;  Location: [facility] OR;  Service: Orthopedics;  Laterality: Right;   TOTAL HIP ARTHROPLASTY Left 08/26/2018   Procedure: LEFT TOTAL HIP ARTHROPLASTY ANTERIOR APPROACH;  Surgeon: Saundra Curl, MD;  Location: [facility];  Service: Orthopedics;  Laterality: Left;   Patient Active Problem List   Diagnosis Date Noted   Pulmonary hypertension, unspecified (HCC) 11/24/2023   Coronary artery calcification 11/24/2023   Dizziness 11/24/2023   Aortic dilatation (HCC) 11/24/2023   Type 2 diabetes mellitus with diabetic neuropathy (HCC) 10/07/2023   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) 10/07/2023   Essential (primary) hypertension 10/07/2023   Gastroesophageal reflux disease without esophagitis 10/07/2023   Primary osteoarthritis 10/07/2023   Hemorrhoids 09/11/2022   Hemifacial spasm of left side of face 05/18/2022   Cognitive impairment 05/08/2022   Primary osteoarthritis of hip 08/26/2018   Proximal leg weakness 04/07/2014   Difficulty walking 04/07/2014   Impairment of balance 04/07/2014   Primary localized osteoarthrosis, pelvic region and thigh 03/24/2014    PCP: Kathyleen Parkins,  MD  REFERRING PROVIDER: Ward Guy, DO  REFERRING DIAG: 'Lumbago'  Rationale for Evaluation and Treatment: Rehabilitation  THERAPY DIAG:  Acute left-sided low back pain with left-sided sciatica  Muscle weakness (generalized)  Pain in right hip  ONSET DATE: ~ 2 weeks  SUBJECTIVE:                                                                                                                                                                                           SUBJECTIVE STATEMENT: Pt  states that he was feeling a little sore after busy chore day last night. Pt states he does not have any back or leg pain.    Pt reports that about 2 weeks ago, after using his chainsaw  and noticed the pain that runs down the back down to his ankle. Currently used pain medications. Worst 6/10 pain. Pt's wife reports that the pt reported that his pain is less in the morning and gets worse throughout the day.  PERTINENT HISTORY:  Bilateral hip replacements  PAIN:  Are you having pain? No  PRECAUTIONS: None  RED FLAGS: None   WEIGHT BEARING RESTRICTIONS: No  FALLS:  Has patient fallen in last 6 months? No   PATIENT GOALS: "getting out of pain"  OBJECTIVE:  Note: Objective measures were completed at Evaluation unless otherwise noted.  DIAGNOSTIC FINDINGS:    PATIENT SURVEYS:  Modified Oswestry 14/50 = 28%   03/06/24: Modified Oswestry Low Back Pain Disability Questionnaire: 10 / 50 = 20.0 %  COGNITION: Overall cognitive status: Within functional limits for tasks assessed     SENSATION: WFL  POSTURE: rounded shoulders, forward head, increased lumbar lordosis, and posterior pelvic tilt  PALPATION: Hypomobile in CPAs and UPAs throughout Lumbar spine; unable to reproduce concordant pain  LUMBAR ROM:   AROM eval 03/06/24  Flexion 75%  75%  Extension 75% 75%  Right lateral flexion 75% 75%  Left lateral flexion 75% * and "something is not right" 75%  Right rotation 75% 75%  Left rotation 75% 75%   (Blank rows = not tested)  LOWER EXTREMITY ROM:     Active  Right eval Left eval  Hip flexion    Hip extension    Hip abduction    Hip adduction    Hip internal rotation    Hip external rotation    Knee flexion    Knee extension    Ankle dorsiflexion    Ankle plantarflexion    Ankle inversion    Ankle eversion     (Blank rows = not tested)  LOWER EXTREMITY MMT:    MMT Right eval Left  eval Right  03/06/24 Left  03/06/24  Hip flexion 3+ 3+ 4 4-  Hip  extension 3+ 3+ 4 4-  Hip abduction 3+ 3+ 4 4-  Hip adduction      Hip internal rotation      Hip external rotation      Knee flexion      Knee extension 4 4 4+ 4+  Ankle dorsiflexion 4 4 4+ 4+  Ankle plantarflexion      Ankle inversion      Ankle eversion       (Blank rows = not tested)  LUMBAR SPECIAL TESTS:  Straight leg raise test: Negative, Slump test: Negative, Quadrant test: Negative, and Single leg stance test: Negative  FUNCTIONAL TESTS:  .30 Second Chair Stand Test: 9 Norms:  Age 77-64 13-69 70-74 75-79 80-84 85-89 90-94  Women 15 15 14 13 12 11 9   Men 17 16 15 14 13 11 9    L SLS: Unable  R SLS: Unable  Norms: 18-39  F: 43.5 seconds  M: 43.2 seconds 40-49  F: 40.4 seconds  M: 40.1 seconds 50-59  F: 36 seconds  M: 38.1 seconds 60-69  F: 25.1 seconds  M: 28.7 seconds 70-79  F: 11.3 seconds  M: 18.3 seconds  2 minute walk test: To initiate next session  03/06/24: 30 sec chair rise test: 11 STS, no UE use SLS: R - 3" ,  L - 2"  DGI 1. Gait level surface (3) Normal: Walks 20', no assistive devices, good sped, no evidence for imbalance, normal gait pattern 2. Change in gait speed (2) Mild Impairment: Is able to change speed but demonstrates mild gait deviations, or not gait deviations but unable to achieve a significant change in velocity, or uses an assistive device. 3. Gait with horizontal head turns (2) Mild Impairment: Performs head turns smoothly with slight change in gait velocity, i.e., minor disruption to smooth gait path or uses walking aid. 4. Gait with vertical head turns (2) Mild Impairment: Performs head turns smoothly with slight change in gait velocity, i.e., minor disruption to smooth gait path or uses walking aid. 5. Gait and pivot turn (1) Moderate Impairment: Turns slowly, requires verbal cueing, requires several small steps to catch balance following turn and stop. 6. Step over obstacle (2) Mild Impairment: Is able to step over box,  but must slow down and adjust steps to clear box safely. 7. Step around obstacles (2) Mild Impairment: Is able to step around both cones, but must slow down and adjust steps to clear cones. 8. Stairs (2) Mild Impairment: Alternating feet, must use rail.  TOTAL SCORE: 16 / 24  GAIT: Distance walked: 28ft Assistive device utilized: None Level of assistance: Complete Independence Comments: Antalgia with LLE and bilatearl trendelenberg. Pt with decreased LLE step length and stance phase.   TREATMENT DATE:  03/17/2024  Therapeutic Exercise: -Nustep 6 minutes, level 4 resistance, pt cued for 70 spm -Standing shoulder extensions, 3 sets of 10 reps, GTB at chin height (3rd set on blue foam, only able to complete 5 on foam) -Aeromat walks (tandem and lateral stepping) in parallel bars, 3 laps   Therapeutic Activity: -Sled Pushes/pulls, 3 laps with 20 lbs, pt demonstrates increased difficulty with pulling -Sit to stands with tidal tank column, 2 sets of 10 reps, pt cued for core activation -Tidal tank column walks and marches, 1 lap each variation, 100 feet per lap -Speed step ups, 2 sets of 30 second trials, 1.5 first trial, 12  second trial   03/12/24 Nustep seat 9, level 3 UE/LE 5 minutes Standing:  alternating march with no UE assist 5# ankle weights 20X  Hip abduction 5# ankle weights 2X10  Hip extension 5# ankle weights 2X10  Lateral step overs, 6" step 10X with 1 UE assist  Forward step ups 6" 10X each lead with 1 UE assist  Tandem stance 30" X2 each LE  Vectors 10X3" each LE with 1 UE assist  03/11/24: NuStep, seat 9, 5', lvl 2 Forward Steps, 6 inch step, 10x, CGA, no UE support Lateral step overs, 5x, CGA, no UE support Forward, lateral and backward foot steps, feet centered on X, 10x each LE Marching in // bars, 5 lb. AW, 1'x2 STS w/ 10lb tidal tank, 10x1, 15x1 Hamstring stretch, 2x30"    PATIENT EDUCATION:  Education details: PT Evaluation, findings, prognosis,  frequency, attendance policy, and HEP. Person educated: Patient Education method: Medical illustrator Education comprehension: verbalized understanding  HOME EXERCISE PROGRAM: Access Code: [email or web address] URL: [email or web address] Date: 02/07/2024 Prepared by: Irene Mannheim  Exercises - Supine Bridge  - 1 x daily - 7 x weekly - 3 sets - 12-15 reps - 3 hold - Clamshell  - 1 x daily - 7 x weekly - 3 sets - 12-15 reps - 3 hold  -Single leg balance with counter support -Side stepping   ASSESSMENT:  CLINICAL IMPRESSION: Patient continues to demonstrate improved LE strength, decreased low back pain, increased gait quality and increased tolerance to balance involved activities. Patient does continue to demonstrate hesitation with balance based activities specifically the shoulder rows with blue foam, gait belt and CGA used for safety. Patient able to progress dynamic balance and core activation exercises today with tidal tank activities, good performance with verbal cueing for controlled movement. Patient would continue to benefit from skilled physical therapy for continued alleviation of LBP,  increased endurance with ambulation, increased LE strength, and improved balance for improved quality of life, improved independence with gait training and continued progress towards therapy goals.    Patient is a 78 y.o. male who was seen today for physical therapy evaluation and treatment for 'Lumbago.' Unable to reproduce concordant pain but reports LLE radiating pain and major balance deficits. Pt demonstrating reduced Lumbar ROM, hip and BLE muscle weakness, abnormal gait, balance deficits and pain limiting pt's functional mobility, transfers, ADLs and desired functional dynamic activities. Pt will benefit from skilled Physical Therapy services to address deficits/limitations in order to improve functional and QOL.    OBJECTIVE IMPAIRMENTS: Abnormal gait, decreased activity  tolerance, decreased balance, decreased endurance, decreased mobility, decreased ROM, decreased strength, hypomobility, increased fascial restrictions, postural dysfunction, and pain.   ACTIVITY LIMITATIONS: carrying, lifting, bending, standing, squatting, stairs, bed mobility, and reach over head  PARTICIPATION LIMITATIONS: cleaning, laundry, driving, shopping, community activity, occupation, and yard work  PERSONAL FACTORS: Age and Past/current experiences are also affecting patient's functional outcome.   REHAB POTENTIAL: Good  CLINICAL DECISION MAKING: Evolving/moderate complexity  EVALUATION COMPLEXITY: Low   GOALS: Goals reviewed with patient? No  SHORT TERM GOALS: Target date: 02/28/24  Pt will be independent with HEP in order to demonstrate participation in Physical Therapy POC.  Baseline: Goal status: IN PROGRESS  2.  Pt will report 5/10 pain with mobility in order to demonstrate improved pain with ADLs.  Baseline:  Goal status: IN PROGRESS  LONG TERM GOALS: Target date: 03/20/24  Pt will improve 30 Second Chair Stand Test by at least 2 reps in order to demonstrate  improved functional strength to return to desired activities.  Baseline: see objective.  Goal status: MET  2.  Pt will improve Single Leg balance by at least 5 seconds bilaterally in order to demonstrate improved functional safety during community setting Baseline: see objective.  Goal status: IN PROGRESS  3.  Pt will improve Modified Oswestry score by 8 points in order to demonstrate improved pain with functional goals and outcomes. Baseline: see objective.  Goal status: IN PROGRESS  4.  Pt will report 3/10 pain with mobility in order to demonstrate reduced pain with ADLs lasting greater than 30 minutes.  Baseline: see objective.  Goal status: IN PROGRESS  PLAN:  PT FREQUENCY: 2x/week  PT DURATION: 6 weeks  PLANNED INTERVENTIONS: 97164- PT Re-evaluation, 97750- Physical Performance Testing,  97110-Therapeutic exercises, 97530- Therapeutic activity, [id number]- Neuromuscular re-education, 97535- Self Care, 16109- Manual therapy, [phone]- Gait training, [phone]- Electrical stimulation (unattended), [phone]- Electrical stimulation (manual), [id number]- Traction (mechanical), Patient/Family education, Balance training, Stair training, Taping, Dry Needling, Joint mobilization, Joint manipulation, Spinal manipulation, Spinal mobilization, Cryotherapy, and Moist heat.  PLAN FOR NEXT SESSION: Hip strengthening, ROM of Lumbar spine, balance    Armond Bertin, PT, DPT [facility] Office: [phone] 11:09 AM, 03/17/24

## 2024-03-19 ENCOUNTER — Ambulatory Visit (HOSPITAL_COMMUNITY): Admitting: Physical Therapy

## 2024-03-19 DIAGNOSIS — Z9181 History of falling: Secondary | ICD-10-CM

## 2024-03-19 DIAGNOSIS — M25551 Pain in right hip: Secondary | ICD-10-CM | POA: Diagnosis not present

## 2024-03-19 DIAGNOSIS — M6281 Muscle weakness (generalized): Secondary | ICD-10-CM | POA: Diagnosis not present

## 2024-03-19 DIAGNOSIS — M5442 Lumbago with sciatica, left side: Secondary | ICD-10-CM | POA: Diagnosis not present

## 2024-03-19 NOTE — Therapy (Signed)
 OUTPATIENT PHYSICAL THERAPY THORACOLUMBAR TREATMENT   Patient Name: Erik Odonnell MRN: [id number] DOB:04-23-1946, 78 y.o., male Today's Date: 03/19/2024    END OF SESSION:  PT End of Session - 03/19/24 1021     Visit Number 9    Number of Visits 13    Date for PT Re-Evaluation 04/03/24    Authorization Type [employer]    Authorization Time Period Seeking new auth    Progress Note Due on Visit 10    PT Start Time 1020    PT Stop Time 1100    PT Time Calculation (min) 40 min    Activity Tolerance Patient tolerated treatment well    Behavior During Therapy [facility] for tasks assessed/performed               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) 10/07/2023   Difficulty walking 04/07/2014   IMO SNOMED Dx Update Oct 2024     Diverticulosis    Essential (primary) hypertension 10/07/2023   Gastroesophageal reflux disease without esophagitis 10/07/2023   Gout    Hemifacial spasm    left side affected takes Artane  daily;    Hemifacial spasm of left side of face 05/18/2022   Hemorrhoids 09/11/2022   History of bronchitis 11/2013   Hypertension    takes Ziac  daily   Impairment of balance 04/07/2014   Joint pain    Memory loss    Primary osteoarthritis 10/07/2023   Primary osteoarthritis of hip 08/26/2018   Proximal leg weakness 04/07/2014   Type 2 diabetes mellitus with diabetic neuropathy (HCC) 10/07/2023   Past Surgical History:  Procedure Laterality Date   CATARACT EXTRACTION, BILATERAL Bilateral 2015, 2016   COLONOSCOPY     COLONOSCOPY N/A 12/18/2017   diverticulosis and internal hemorhoids.   COLONOSCOPY WITH PROPOFOL  N/A 08/07/2022   Procedure: COLONOSCOPY WITH PROPOFOL ;  Surgeon: Vinetta Greening, DO;  Location: [facility];  Service: Endoscopy;  Laterality: N/A;  11:00 am   eye muschle surgeries  as a child   x 5   HERNIA REPAIR Left    as a  child-inguinal;right inguinal in 1969   POLYPECTOMY  08/07/2022   Procedure: POLYPECTOMY;  Surgeon: Vinetta Greening, DO;  Location: [facility];  Service: Endoscopy;;   TOTAL HIP ARTHROPLASTY Right 03/24/2014   Procedure: TOTAL HIP ARTHROPLASTY ANTERIOR APPROACH;  Surgeon: Ferd Householder, MD;  Location: [facility] OR;  Service: Orthopedics;  Laterality: Right;   TOTAL HIP ARTHROPLASTY Left 08/26/2018   Procedure: LEFT TOTAL HIP ARTHROPLASTY ANTERIOR APPROACH;  Surgeon: Saundra Curl, MD;  Location: [facility];  Service: Orthopedics;  Laterality: Left;   Patient Active Problem List   Diagnosis Date Noted   Pulmonary hypertension, unspecified (HCC) 11/24/2023   Coronary artery calcification 11/24/2023   Dizziness 11/24/2023   Aortic dilatation (HCC) 11/24/2023   Type 2 diabetes mellitus with diabetic neuropathy (HCC) 10/07/2023   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) 10/07/2023   Essential (primary) hypertension 10/07/2023   Gastroesophageal reflux disease without esophagitis 10/07/2023   Primary osteoarthritis 10/07/2023   Hemorrhoids 09/11/2022   Hemifacial spasm of left side of face 05/18/2022   Cognitive impairment 05/08/2022   Primary osteoarthritis of hip 08/26/2018   Proximal leg weakness 04/07/2014   Difficulty walking 04/07/2014   Impairment of balance 04/07/2014   Primary localized osteoarthrosis, pelvic region and thigh 03/24/2014    PCP: Lewayne Records,  Salena Craven, MD  REFERRING PROVIDER: Ward Guy, DO  REFERRING DIAG: 'Lumbago'  Rationale for Evaluation and Treatment: Rehabilitation  THERAPY DIAG:  Acute left-sided low back pain with left-sided sciatica  Muscle weakness (generalized)  Pain in right hip  History of falling  ONSET DATE: ~ 2 weeks  SUBJECTIVE:                                                                                                                                                                                            SUBJECTIVE STATEMENT: Pt states he is doing well today.  No pain or issues.    Pt reports that about 2 weeks ago, after using his chainsaw  and noticed the pain that runs down the back down to his ankle. Currently used pain medications. Worst 6/10 pain. Pt's wife reports that the pt reported that his pain is less in the morning and gets worse throughout the day.  PERTINENT HISTORY:  Bilateral hip replacements  PAIN:  Are you having pain? No  PRECAUTIONS: None  RED FLAGS: None   WEIGHT BEARING RESTRICTIONS: No  FALLS:  Has patient fallen in last 6 months? No   PATIENT GOALS: "getting out of pain"  OBJECTIVE:  Note: Objective measures were completed at Evaluation unless otherwise noted.  DIAGNOSTIC FINDINGS:    PATIENT SURVEYS:  Modified Oswestry 14/50 = 28%   03/06/24: Modified Oswestry Low Back Pain Disability Questionnaire: 10 / 50 = 20.0 %  COGNITION: Overall cognitive status: Within functional limits for tasks assessed     SENSATION: WFL  POSTURE: rounded shoulders, forward head, increased lumbar lordosis, and posterior pelvic tilt  PALPATION: Hypomobile in CPAs and UPAs throughout Lumbar spine; unable to reproduce concordant pain  LUMBAR ROM:   AROM eval 03/06/24  Flexion 75%  75%  Extension 75% 75%  Right lateral flexion 75% 75%  Left lateral flexion 75% * and "something is not right" 75%  Right rotation 75% 75%  Left rotation 75% 75%   (Blank rows = not tested)  LOWER EXTREMITY ROM:     Active  Right eval Left eval  Hip flexion    Hip extension    Hip abduction    Hip adduction    Hip internal rotation    Hip external rotation    Knee flexion    Knee extension    Ankle dorsiflexion    Ankle plantarflexion    Ankle inversion    Ankle eversion     (Blank rows = not tested)  LOWER EXTREMITY MMT:    MMT Right eval Left eval Right  03/06/24 Left  03/06/24  Hip  flexion 3+ 3+ 4 4-  Hip extension 3+ 3+ 4 4-  Hip abduction 3+ 3+ 4 4-   Hip adduction      Hip internal rotation      Hip external rotation      Knee flexion      Knee extension 4 4 4+ 4+  Ankle dorsiflexion 4 4 4+ 4+  Ankle plantarflexion      Ankle inversion      Ankle eversion       (Blank rows = not tested)  LUMBAR SPECIAL TESTS:  Straight leg raise test: Negative, Slump test: Negative, Quadrant test: Negative, and Single leg stance test: Negative  FUNCTIONAL TESTS:  .30 Second Chair Stand Test: 9 Norms:  Age 92-64 60-69 70-74 75-79 80-84 85-89 90-94  Women 15 15 14 13 12 11 9   Men 17 16 15 14 13 11 9    L SLS: Unable  R SLS: Unable  Norms: 18-39  F: 43.5 seconds  M: 43.2 seconds 40-49  F: 40.4 seconds  M: 40.1 seconds 50-59  F: 36 seconds  M: 38.1 seconds 60-69  F: 25.1 seconds  M: 28.7 seconds 70-79  F: 11.3 seconds  M: 18.3 seconds  2 minute walk test: To initiate next session  03/06/24: 30 sec chair rise test: 11 STS, no UE use SLS: R - 3" ,  L - 2"  DGI 1. Gait level surface (3) Normal: Walks 20', no assistive devices, good sped, no evidence for imbalance, normal gait pattern 2. Change in gait speed (2) Mild Impairment: Is able to change speed but demonstrates mild gait deviations, or not gait deviations but unable to achieve a significant change in velocity, or uses an assistive device. 3. Gait with horizontal head turns (2) Mild Impairment: Performs head turns smoothly with slight change in gait velocity, i.e., minor disruption to smooth gait path or uses walking aid. 4. Gait with vertical head turns (2) Mild Impairment: Performs head turns smoothly with slight change in gait velocity, i.e., minor disruption to smooth gait path or uses walking aid. 5. Gait and pivot turn (1) Moderate Impairment: Turns slowly, requires verbal cueing, requires several small steps to catch balance following turn and stop. 6. Step over obstacle (2) Mild Impairment: Is able to step over box, but must slow down and adjust steps to clear box  safely. 7. Step around obstacles (2) Mild Impairment: Is able to step around both cones, but must slow down and adjust steps to clear cones. 8. Stairs (2) Mild Impairment: Alternating feet, must use rail.  TOTAL SCORE: 16 / 24  GAIT: Distance walked: 72ft Assistive device utilized: None Level of assistance: Complete Independence Comments: Antalgia with LLE and bilatearl trendelenberg. Pt with decreased LLE step length and stance phase.   TREATMENT DATE:  03/19/24 Nustep seat 8 level 4, UE/LE 5 minutes Sit to stands with tidal tank column 2X10  Tidal tank walk full lap around clinic 226 feet Airex balance beam side stepping 1/2RT, attempted tandem, unable Tandem gait on 26ft line 2RT CGA Side stepping 20Ft line 2RT SBA and cues for larger steps   03/17/2024  Therapeutic Exercise: -Nustep 6 minutes, level 4 resistance, pt cued for 70 spm -Standing shoulder extensions, 3 sets of 10 reps, GTB at chin height (3rd set on blue foam, only able to complete 5 on foam) -Aeromat walks (tandem and lateral stepping) in parallel bars, 3 laps   Therapeutic Activity: -Sled Pushes/pulls, 3 laps with 20 lbs, pt demonstrates increased  difficulty with pulling -Sit to stands with tidal tank column, 2 sets of 10 reps, pt cued for core activation -Tidal tank column walks and marches, 1 lap each variation, 100 feet per lap -Speed step ups, 2 sets of 30 second trials, 1.5 first trial, 12 second trial   03/12/24 Nustep seat 9, level 3 UE/LE 5 minutes Standing:  alternating march with no UE assist 5# ankle weights 20X  Hip abduction 5# ankle weights 2X10  Hip extension 5# ankle weights 2X10  Lateral step overs, 6" step 10X with 1 UE assist  Forward step ups 6" 10X each lead with 1 UE assist  Tandem stance 30" X2 each LE  Vectors 10X3" each LE with 1 UE assist  03/11/24: NuStep, seat 9, 5', lvl 2 Forward Steps, 6 inch step, 10x, CGA, no UE support Lateral step overs, 5x, CGA, no UE support Forward,  lateral and backward foot steps, feet centered on X, 10x each LE Marching in // bars, 5 lb. AW, 1'x2 STS w/ 10lb tidal tank, 10x1, 15x1 Hamstring stretch, 2x30"    PATIENT EDUCATION:  Education details: PT Evaluation, findings, prognosis, frequency, attendance policy, and HEP. Person educated: Patient Education method: Medical illustrator Education comprehension: verbalized understanding  HOME EXERCISE PROGRAM: Access Code: [email or web address] URL: [email or web address] Date: 02/07/2024 Prepared by: Irene Mannheim  Exercises - Supine Bridge  - 1 x daily - 7 x weekly - 3 sets - 12-15 reps - 3 hold - Clamshell  - 1 x daily - 7 x weekly - 3 sets - 12-15 reps - 3 hold  -Single leg balance with counter support -Side stepping   ASSESSMENT:  CLINICAL IMPRESSION: Primary focus today on improving balance/stability.  Attempted airex for tandem and sidestepping, however too challenging.  Completed on line with CGA needed for tandem and SBA for side stepping.  PT tends to take smaller steps with cues needed for lateral.  Pt required 2 seated rest breaks during session today.  Overall improving in LE strength/stability.   Sit to stands with tidal tank remains challenging with cues needed to come to complete standing position before returning to sit.  No complaints of lower back pain or discomfort during session today. Patient will continue to benefit from skilled physical therapy.      Patient is a 78 y.o. male who was seen today for physical therapy evaluation and treatment for 'Lumbago.' Unable to reproduce concordant pain but reports LLE radiating pain and major balance deficits. Pt demonstrating reduced Lumbar ROM, hip and BLE muscle weakness, abnormal gait, balance deficits and pain limiting pt's functional mobility, transfers, ADLs and desired functional dynamic activities. Pt will benefit from skilled Physical Therapy services to address deficits/limitations in order to  improve functional and QOL.    OBJECTIVE IMPAIRMENTS: Abnormal gait, decreased activity tolerance, decreased balance, decreased endurance, decreased mobility, decreased ROM, decreased strength, hypomobility, increased fascial restrictions, postural dysfunction, and pain.   ACTIVITY LIMITATIONS: carrying, lifting, bending, standing, squatting, stairs, bed mobility, and reach over head  PARTICIPATION LIMITATIONS: cleaning, laundry, driving, shopping, community activity, occupation, and yard work  PERSONAL FACTORS: Age and Past/current experiences are also affecting patient's functional outcome.   REHAB POTENTIAL: Good  CLINICAL DECISION MAKING: Evolving/moderate complexity  EVALUATION COMPLEXITY: Low   GOALS: Goals reviewed with patient? No  SHORT TERM GOALS: Target date: 02/28/24  Pt will be independent with HEP in order to demonstrate participation in Physical Therapy POC.  Baseline: Goal status: IN PROGRESS  2.  Pt will report 5/10 pain  with mobility in order to demonstrate improved pain with ADLs.  Baseline:  Goal status: IN PROGRESS  LONG TERM GOALS: Target date: 03/20/24  Pt will improve 30 Second Chair Stand Test by at least 2 reps in order to demonstrate improved functional strength to return to desired activities.  Baseline: see objective.  Goal status: MET  2.  Pt will improve Single Leg balance by at least 5 seconds bilaterally in order to demonstrate improved functional safety during community setting Baseline: see objective.  Goal status: IN PROGRESS  3.  Pt will improve Modified Oswestry score by 8 points in order to demonstrate improved pain with functional goals and outcomes. Baseline: see objective.  Goal status: IN PROGRESS  4.  Pt will report 3/10 pain with mobility in order to demonstrate reduced pain with ADLs lasting greater than 30 minutes.  Baseline: see objective.  Goal status: IN PROGRESS  PLAN:  PT FREQUENCY: 2x/week  PT DURATION: 6  weeks  PLANNED INTERVENTIONS: 97164- PT Re-evaluation, 97750- Physical Performance Testing, 97110-Therapeutic exercises, 97530- Therapeutic activity, [id number]- Neuromuscular re-education, 97535- Self Care, [id number]- Manual therapy, [phone]- Gait training, [phone]- Electrical stimulation (unattended), [phone]- Electrical stimulation (manual), [id number]- Traction (mechanical), Patient/Family education, Balance training, Stair training, Taping, Dry Needling, Joint mobilization, Joint manipulation, Spinal manipulation, Spinal mobilization, Cryotherapy, and Moist heat.  PLAN FOR NEXT SESSION: Hip strengthening, ROM of Lumbar spine, balance. Complete 10th visit progress note next session.    Lorenso Romance, PTA/CLT [facility] Ph: [phone] 10:22 AM, 03/19/24

## 2024-03-23 ENCOUNTER — Encounter (HOSPITAL_COMMUNITY)

## 2024-03-24 ENCOUNTER — Encounter (HOSPITAL_COMMUNITY): Admitting: Physical Therapy

## 2024-03-26 ENCOUNTER — Encounter (HOSPITAL_COMMUNITY)

## 2024-03-30 ENCOUNTER — Ambulatory Visit (HOSPITAL_COMMUNITY): Attending: Orthopedic Surgery

## 2024-03-30 DIAGNOSIS — M6281 Muscle weakness (generalized): Secondary | ICD-10-CM | POA: Insufficient documentation

## 2024-03-30 DIAGNOSIS — M5442 Lumbago with sciatica, left side: Secondary | ICD-10-CM | POA: Diagnosis not present

## 2024-03-30 NOTE — Therapy (Signed)
 OUTPATIENT PHYSICAL THERAPY THORACOLUMBAR TREATMENT/PROGRESS NOTE/ DISCHARGE Progress Note Reporting Period 02/07/24 to 03/30/24  See note below for Objective Data and Assessment of Progress/Goals.  PHYSICAL THERAPY DISCHARGE SUMMARY  Visits from Start of Care: 10  Current functional level related to goals / functional outcomes: See below   Remaining deficits: See below   Education / Equipment: HEP   Patient agrees to discharge. Patient goals were partially met. Patient is being discharged due to meeting the stated rehab goals. Met all but one long term goal        Patient Name: Erik Odonnell MRN: [id number] DOB:July 11, 1946, 78 y.o., male Today's Date: 03/30/2024    END OF SESSION:  PT End of Session - 03/30/24 1020     Visit Number 10    Number of Visits 13    Date for PT Re-Evaluation 04/03/24    Authorization Type [employer]    Authorization Time Period Seeking new auth    Progress Note Due on Visit 10    PT Start Time 1018    PT Stop Time 1056    PT Time Calculation (min) 38 min    Activity Tolerance Patient tolerated treatment well    Behavior During Therapy [facility] for tasks assessed/performed               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) 10/07/2023   Difficulty walking 04/07/2014   IMO SNOMED Dx Update Oct 2024     Diverticulosis    Essential (primary) hypertension 10/07/2023   Gastroesophageal reflux disease without esophagitis 10/07/2023   Gout    Hemifacial spasm    left side affected takes Artane  daily;    Hemifacial spasm of left side of face 05/18/2022   Hemorrhoids 09/11/2022   History of bronchitis 11/2013   Hypertension    takes Ziac  daily   Impairment of balance 04/07/2014   Joint pain    Memory loss    Primary osteoarthritis 10/07/2023   Primary osteoarthritis of hip 08/26/2018   Proximal leg weakness 04/07/2014   Type  2 diabetes mellitus with diabetic neuropathy (HCC) 10/07/2023   Past Surgical History:  Procedure Laterality Date   CATARACT EXTRACTION, BILATERAL Bilateral 2015, 2016   COLONOSCOPY     COLONOSCOPY N/A 12/18/2017   diverticulosis and internal hemorhoids.   COLONOSCOPY WITH PROPOFOL  N/A 08/07/2022   Procedure: COLONOSCOPY WITH PROPOFOL ;  Surgeon: Vinetta Greening, DO;  Location: [facility];  Service: Endoscopy;  Laterality: N/A;  11:00 am   eye muschle surgeries  as a child   x 5   HERNIA REPAIR Left    as a child-inguinal;right inguinal in 1969   POLYPECTOMY  08/07/2022   Procedure: POLYPECTOMY;  Surgeon: Vinetta Greening, DO;  Location: [facility];  Service: Endoscopy;;   TOTAL HIP ARTHROPLASTY Right 03/24/2014   Procedure: TOTAL HIP ARTHROPLASTY ANTERIOR APPROACH;  Surgeon: Ferd Householder, MD;  Location: [facility] OR;  Service: Orthopedics;  Laterality: Right;   TOTAL HIP ARTHROPLASTY Left 08/26/2018   Procedure: LEFT TOTAL HIP ARTHROPLASTY ANTERIOR APPROACH;  Surgeon: Saundra Curl, MD;  Location: [facility] OR[facility];  Service: Orthopedics;  Laterality: Left;   Patient Active Problem List   Diagnosis Date Noted   Pulmonary hypertension, unspecified (HCC) 11/24/2023   Coronary artery calcification 11/24/2023   Dizziness 11/24/2023   Aortic dilatation (HCC) 11/24/2023   Type 2 diabetes mellitus with diabetic  neuropathy (HCC) 10/07/2023   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) 10/07/2023   Essential (primary) hypertension 10/07/2023   Gastroesophageal reflux disease without esophagitis 10/07/2023   Primary osteoarthritis 10/07/2023   Hemorrhoids 09/11/2022   Hemifacial spasm of left side of face 05/18/2022   Cognitive impairment 05/08/2022   Primary osteoarthritis of hip 08/26/2018   Proximal leg weakness 04/07/2014   Difficulty walking 04/07/2014   Impairment of balance 04/07/2014   Primary localized osteoarthrosis, pelvic region and thigh 03/24/2014    PCP: Kathyleen Parkins, MD  REFERRING PROVIDER: Ward Guy, DO  REFERRING DIAG: 'Lumbago'  Rationale for Evaluation and Treatment: Rehabilitation  THERAPY DIAG:  Acute left-sided low back pain with left-sided sciatica  Muscle weakness (generalized)  ONSET DATE: ~ 2 weeks  SUBJECTIVE:                                                                                                                                                                                           SUBJECTIVE STATEMENT: No pain; "80%" better overall; gets a little left hip ache as the day goes on; after suppertime; takes some medication and that takes care of it.    Pt reports that about 2 weeks ago, after using his chainsaw  and noticed the pain that runs down the back down to his ankle. Currently used pain medications. Worst 6/10 pain. Pt's wife reports that the pt reported that his pain is less in the morning and gets worse throughout the day.  PERTINENT HISTORY:  Bilateral hip replacements  PAIN:  Are you having pain? No  PRECAUTIONS: None  RED FLAGS: None   WEIGHT BEARING RESTRICTIONS: No  FALLS:  Has patient fallen in last 6 months? No   PATIENT GOALS: "getting out of pain"  OBJECTIVE:  Note: Objective measures were completed at Evaluation unless otherwise noted.  DIAGNOSTIC FINDINGS:    PATIENT SURVEYS:  Modified Oswestry 14/50 = 28%   03/06/24: Modified Oswestry Low Back Pain Disability Questionnaire: 10 / 50 = 20.0 %  COGNITION: Overall cognitive status: Within functional limits for tasks assessed     SENSATION: WFL  POSTURE: rounded shoulders, forward head, increased lumbar lordosis, and posterior pelvic tilt  PALPATION: Hypomobile in CPAs and UPAs throughout Lumbar spine; unable to reproduce concordant pain  LUMBAR ROM:   AROM eval 03/06/24 03/30/24  Flexion 75%  75%   Extension 75% 75%   Right lateral flexion 75% 75%   Left lateral flexion 75% * and "something is not right" 75%    Right rotation 75% 75%   Left rotation 75% 75%    (Blank rows = not  tested)  LOWER EXTREMITY ROM:     Active  Right eval Left eval  Hip flexion    Hip extension    Hip abduction    Hip adduction    Hip internal rotation    Hip external rotation    Knee flexion    Knee extension    Ankle dorsiflexion    Ankle plantarflexion    Ankle inversion    Ankle eversion     (Blank rows = not tested)  LOWER EXTREMITY MMT:    MMT Right eval Left eval Right  03/06/24 Left  03/06/24 Right 03/30/24 Left 03/30/24  Hip flexion 3+ 3+ 4 4- 5 4+  Hip extension 3+ 3+ 4 4-    Hip abduction 3+ 3+ 4 4-    Hip adduction        Hip internal rotation        Hip external rotation        Knee flexion        Knee extension 4 4 4+ 4+ 5 4+  Ankle dorsiflexion 4 4 4+ 4+ 5 4+  Ankle plantarflexion        Ankle inversion        Ankle eversion         (Blank rows = not tested)  LUMBAR SPECIAL TESTS:  Straight leg raise test: Negative, Slump test: Negative, Quadrant test: Negative, and Single leg stance test: Negative  FUNCTIONAL TESTS:  .30 Second Chair Stand Test: 9 Norms:  Age 22-64 41-69 70-74 75-79 80-84 85-89 90-94  Women 15 15 14 13 12 11 9   Men 17 16 15 14 13 11 9    L SLS: Unable  R SLS: Unable  Norms: 18-39  F: 43.5 seconds  M: 43.2 seconds 40-49  F: 40.4 seconds  M: 40.1 seconds 50-59  F: 36 seconds  M: 38.1 seconds 60-69  F: 25.1 seconds  M: 28.7 seconds 70-79  F: 11.3 seconds  M: 18.3 seconds  2 minute walk test: To initiate next session  03/06/24: 30 sec chair rise test: 11 STS, no UE use SLS: R - 3" ,  L - 2"  DGI 1. Gait level surface (3) Normal: Walks 20', no assistive devices, good sped, no evidence for imbalance, normal gait pattern 2. Change in gait speed (2) Mild Impairment: Is able to change speed but demonstrates mild gait deviations, or not gait deviations but unable to achieve a significant change in velocity, or uses an assistive device. 3. Gait  with horizontal head turns (2) Mild Impairment: Performs head turns smoothly with slight change in gait velocity, i.e., minor disruption to smooth gait path or uses walking aid. 4. Gait with vertical head turns (2) Mild Impairment: Performs head turns smoothly with slight change in gait velocity, i.e., minor disruption to smooth gait path or uses walking aid. 5. Gait and pivot turn (1) Moderate Impairment: Turns slowly, requires verbal cueing, requires several small steps to catch balance following turn and stop. 6. Step over obstacle (2) Mild Impairment: Is able to step over box, but must slow down and adjust steps to clear box safely. 7. Step around obstacles (2) Mild Impairment: Is able to step around both cones, but must slow down and adjust steps to clear cones. 8. Stairs (2) Mild Impairment: Alternating feet, must use rail.  TOTAL SCORE: 16 / 24  GAIT: Distance walked: 51ft Assistive device utilized: None Level of assistance: Complete Independence Comments: Antalgia with LLE and bilatearl trendelenberg. Pt with decreased  LLE step length and stance phase.   TREATMENT DATE:  03/30/24 Progress note Modified Oswestry 4/50 8% MMT's see above 30 sec sit to stand 13x SLS right 4" and left 3" on left DGI 1. Gait level surface (2) Mild Impairment: Walks 20', uses assistive devices, slower speed, mild gait deviations. 2. Change in gait speed (2) Mild Impairment: Is able to change speed but demonstrates mild gait deviations, or not gait deviations but unable to achieve a significant change in velocity, or uses an assistive device. 3. Gait with horizontal head turns (3) Normal: Performs head turns smoothly with no change in gait. 4. Gait with vertical head turns (3) Normal: Performs head turns smoothly with no change in gait. 5. Gait and pivot turn (2) Mild Impairment: Pivot turns safely in > 3 seconds and stops with no loss of balance. 6. Step over obstacle (2) Mild Impairment: Is  able to step over box, but must slow down and adjust steps to clear box safely. 7. Step around obstacles (3) Normal: Is able to walk around cones safely without changing gait speed; no evidence of imbalance. 8. Stairs (2) Mild Impairment: Alternating feet, must use rail.  TOTAL SCORE: 19 / 24 Updated HEP Standing hip extension 2 x 10 Standing hip abduction 2 x 10 Heel raises 2 x 10   03/19/24 Nustep seat 8 level 4, UE/LE 5 minutes Sit to stands with tidal tank column 2X10  Tidal tank walk full lap around clinic 226 feet Airex balance beam side stepping 1/2RT, attempted tandem, unable Tandem gait on 82ft line 2RT CGA Side stepping 20Ft line 2RT SBA and cues for larger steps   03/17/2024  Therapeutic Exercise: -Nustep 6 minutes, level 4 resistance, pt cued for 70 spm -Standing shoulder extensions, 3 sets of 10 reps, GTB at chin height (3rd set on blue foam, only able to complete 5 on foam) -Aeromat walks (tandem and lateral stepping) in parallel bars, 3 laps   Therapeutic Activity: -Sled Pushes/pulls, 3 laps with 20 lbs, pt demonstrates increased difficulty with pulling -Sit to stands with tidal tank column, 2 sets of 10 reps, pt cued for core activation -Tidal tank column walks and marches, 1 lap each variation, 100 feet per lap -Speed step ups, 2 sets of 30 second trials, 1.5 first trial, 12 second trial   03/12/24 Nustep seat 9, level 3 UE/LE 5 minutes Standing:  alternating march with no UE assist 5# ankle weights 20X  Hip abduction 5# ankle weights 2X10  Hip extension 5# ankle weights 2X10  Lateral step overs, 6" step 10X with 1 UE assist  Forward step ups 6" 10X each lead with 1 UE assist  Tandem stance 30" X2 each LE  Vectors 10X3" each LE with 1 UE assist  03/11/24: NuStep, seat 9, 5', lvl 2 Forward Steps, 6 inch step, 10x, CGA, no UE support Lateral step overs, 5x, CGA, no UE support Forward, lateral and backward foot steps, feet centered on X, 10x each  LE Marching in // bars, 5 lb. AW, 1'x2 STS w/ 10lb tidal tank, 10x1, 15x1 Hamstring stretch, 2x30"    PATIENT EDUCATION:  Education details: PT Evaluation, findings, prognosis, frequency, attendance policy, and HEP. Person educated: Patient Education method: Explanation and Demonstration Education comprehension: verbalized understanding  HOME EXERCISE PROGRAM: 03/30/24 - Standing Hip Extension with Counter Support  - 2 x daily - 7 x weekly - 2 sets - 10 reps - Standing Hip Abduction with Counter Support  - 2 x daily -  7 x weekly - 2 sets - 10 reps - Heel Raises with Counter Support  - 2 x daily - 7 x weekly - 2 sets - 10 reps   Access Code: [email or web address] URL: [email or web address] Date: 02/07/2024 Prepared by: Irene Mannheim  Exercises - Supine Bridge  - 1 x daily - 7 x weekly - 3 sets - 12-15 reps - 3 hold - Clamshell  - 1 x daily - 7 x weekly - 3 sets - 12-15 reps - 3 hold  -Single leg balance with counter support -Side stepping   ASSESSMENT:  CLINICAL IMPRESSION: Progress note today.   Patient has met all but one set rehab goal and is agreeable to discharge at this time.     Patient is a 78 y.o. male who was seen today for physical therapy evaluation and treatment for 'Lumbago.' Unable to reproduce concordant pain but reports LLE radiating pain and major balance deficits. Pt demonstrating reduced Lumbar ROM, hip and BLE muscle weakness, abnormal gait, balance deficits and pain limiting pt's functional mobility, transfers, ADLs and desired functional dynamic activities. Pt will benefit from skilled Physical Therapy services to address deficits/limitations in order to improve functional and QOL.    OBJECTIVE IMPAIRMENTS: Abnormal gait, decreased activity tolerance, decreased balance, decreased endurance, decreased mobility, decreased ROM, decreased strength, hypomobility, increased fascial restrictions, postural dysfunction, and pain.   ACTIVITY LIMITATIONS:  carrying, lifting, bending, standing, squatting, stairs, bed mobility, and reach over head  PARTICIPATION LIMITATIONS: cleaning, laundry, driving, shopping, community activity, occupation, and yard work  PERSONAL FACTORS: Age and Past/current experiences are also affecting patient's functional outcome.   REHAB POTENTIAL: Good  CLINICAL DECISION MAKING: Evolving/moderate complexity  EVALUATION COMPLEXITY: Low   GOALS: Goals reviewed with patient? No  SHORT TERM GOALS: Target date: 02/28/24  Pt will be independent with HEP in order to demonstrate participation in Physical Therapy POC.  Baseline: Goal status: MET  2.  Pt will report 5/10 pain with mobility in order to demonstrate improved pain with ADLs.  Baseline:  Goal status: MET  LONG TERM GOALS: Target date: 03/20/24  Pt will improve 30 Second Chair Stand Test by at least 2 reps in order to demonstrate improved functional strength to return to desired activities.  Baseline: see objective.  Goal status: MET  2.  Pt will improve Single Leg balance by at least 5 seconds bilaterally in order to demonstrate improved functional safety during community setting Baseline: see objective. 4" on right and 3" on left 6/9 Goal status: IN PROGRESS  3.  Pt will improve Modified Oswestry score by 8 points in order to demonstrate improved pain with functional goals and outcomes. Baseline: see objective.  Goal status: MET  4.  Pt will report 3/10 pain with mobility in order to demonstrate reduced pain with ADLs lasting greater than 30 minutes.  Baseline: see objective.  Goal status: MET  PLAN:  PT FREQUENCY: 2x/week  PT DURATION: 6 weeks  PLANNED INTERVENTIONS: [id number]- PT Re-evaluation, 97750- Physical Performance Testing, 97110-Therapeutic exercises, 97530- Therapeutic activity, [id number]- Neuromuscular re-education, 97535- Self Care, 29562- Manual therapy, [phone]- Gait training, [phone]- Electrical stimulation (unattended), [phone]- Electrical  stimulation (manual), [id number]- Traction (mechanical), Patient/Family education, Balance training, Stair training, Taping, Dry Needling, Joint mobilization, Joint manipulation, Spinal manipulation, Spinal mobilization, Cryotherapy, and Moist heat.  PLAN FOR NEXT SESSION: discharge   10:56 AM, 03/30/24 Marticia Reifschneider Small Nikhita Mentzel MPT [facility] [place] [phone]

## 2024-04-02 ENCOUNTER — Encounter (HOSPITAL_COMMUNITY)

## 2024-04-08 DIAGNOSIS — M5416 Radiculopathy, lumbar region: Secondary | ICD-10-CM | POA: Diagnosis not present

## 2024-04-21 DIAGNOSIS — E860 Dehydration: Secondary | ICD-10-CM | POA: Diagnosis not present

## 2024-04-21 DIAGNOSIS — I10 Essential (primary) hypertension: Secondary | ICD-10-CM | POA: Diagnosis not present

## 2024-04-21 DIAGNOSIS — H6123 Impacted cerumen, bilateral: Secondary | ICD-10-CM | POA: Diagnosis not present

## 2024-04-21 DIAGNOSIS — I959 Hypotension, unspecified: Secondary | ICD-10-CM | POA: Diagnosis not present

## 2024-04-21 DIAGNOSIS — R42 Dizziness and giddiness: Secondary | ICD-10-CM | POA: Diagnosis not present

## 2024-04-21 DIAGNOSIS — J3089 Other allergic rhinitis: Secondary | ICD-10-CM | POA: Diagnosis not present

## 2024-04-21 DIAGNOSIS — R059 Cough, unspecified: Secondary | ICD-10-CM | POA: Diagnosis not present

## 2024-04-21 DIAGNOSIS — H9193 Unspecified hearing loss, bilateral: Secondary | ICD-10-CM | POA: Diagnosis not present

## 2024-04-22 ENCOUNTER — Telehealth: Payer: Self-pay | Admitting: Internal Medicine

## 2024-04-22 NOTE — Telephone Encounter (Signed)
 Notified patients wife that office is waiting on a response from provider and will call once provider makes recommendations.

## 2024-04-22 NOTE — Telephone Encounter (Signed)
 Wife (Myra) called to follow-up on next steps regarding medication issue.

## 2024-04-22 NOTE — Telephone Encounter (Signed)
 Pt called in stating pt was in the urgent care yesterday and they changed some of his medications. She states they discontinued hctz 12.5 and now he only taking lisinopril-hydrochlorothiazide  once daily at night.  She asked if Dr. Mallipeddi has any other suggestions or think he needs to f/u.

## 2024-04-23 NOTE — Telephone Encounter (Signed)
 I spoke with wife and she [place] stopped the hydrochlorothiazide  12. 5 mg and advised him to drink more fluids. He is drinking 2 propel waters daily.  Today, wife reports he is doing better his bp's have been 109/68, 106/65, 101/67, HR low 60's  [place] and she will continue to monitor and drop off BP log when they return to town next week.

## 2024-05-07 ENCOUNTER — Encounter: Payer: Self-pay | Admitting: Neurology

## 2024-05-07 ENCOUNTER — Ambulatory Visit: Payer: Medicare PPO | Admitting: Neurology

## 2024-05-07 VITALS — BP 130/74 | Ht 68.0 in | Wt 204.0 lb

## 2024-05-07 DIAGNOSIS — R4789 Other speech disturbances: Secondary | ICD-10-CM | POA: Diagnosis not present

## 2024-05-07 DIAGNOSIS — G5132 Clonic hemifacial spasm, left: Secondary | ICD-10-CM

## 2024-05-07 DIAGNOSIS — G3184 Mild cognitive impairment, so stated: Secondary | ICD-10-CM

## 2024-05-07 NOTE — Patient Instructions (Signed)
 Continue current medications  Continue to follow up with audiology  He has decline additional botox  therapy for his left hemifacial spasm  Return in a year or sooner if worse    There are well-accepted and sensible ways to reduce risk for Alzheimers disease and other degenerative brain disorders .  Exercise Daily Walk A daily 20 minute walk should be part of your routine. Disease related apathy can be a significant roadblock to exercise and the only way to overcome this is to make it a daily routine and perhaps have a reward at the end (something your loved one loves to eat or drink perhaps) or a personal trainer coming to the home can also be very useful. Most importantly, the patient is much more likely to exercise if the caregiver / spouse does it with him/her. In general a structured, repetitive schedule is best.  General Health: Any diseases which effect your body will effect your brain such as a pneumonia, urinary infection, blood clot, heart attack or stroke. Keep contact with your primary care doctor for regular follow ups.  Sleep. A good nights sleep is healthy for the brain. Seven hours is recommended. If you have insomnia or poor sleep habits we can give you some instructions. If you have sleep apnea wear your mask.  Diet: Eating a heart healthy diet is also a good idea; fish and poultry instead of red meat, nuts (mostly non-peanuts), vegetables, fruits, olive oil or canola oil (instead of butter), minimal salt (use other spices to flavor foods), whole grain rice, bread, cereal and pasta and wine in moderation.Research is now showing that the MIND diet, which is a combination of The Mediterranean diet and the DASH diet, is beneficial for cognitive processing and longevity. Information about this diet can be found in The MIND Diet, a book by Annitta Feeling, MS, RDN, and online at [employer]Wild[employer].es  Finances, Power of [address] and Advance Directives: You should  consider putting legal safeguards in place with regard to financial and medical decision making. While the spouse always has power of attorney for medical and financial issues in the absence of any form, you should consider what you want in case the spouse / caregiver is no longer around or capable of making decisions.

## 2024-05-07 NOTE — Progress Notes (Signed)
 [facility]  PATIENT: Erik Odonnell DOB: November 21, 1945  REQUESTING CLINICIAN: Bertell Satterfield, MD HISTORY FROM: Patient and spouse  REASON FOR VISIT: Blepharospasm    HISTORICAL  CHIEF COMPLAINT:  Chief Complaint  Patient presents with   Follow-up    Rm 13, with wife, no changes since last visit   INTERVAL HISTORY 05/07/2024 Patient presents today for follow-up, he is accompanied by wife.  Last visit was a year ago, since then he tells me a lot has been going on.  He started having blood in his urine, so saw urology, had a CT scan and was found to have aneurysm in his aorta and blood vessel in his pelvis.  He did follow-up with pulmonology, vein and vascular, no procedure were done, just observation.  In July he was admitted for dehydration while they were at the beach When he comes in his memory, he feels like his memory is stable but wife tells me his memory is getting worse, he does ask the same questions over and over, he will have trouble with the dates, word finding difficulty.  He still drives, still independent in all his ADLs.  He has not completed his audiology test.   INTERVAL HISTORY 05/08/2023: Patient presents today for follow-up, he is accompanied by wife.  Last visit was a year ago and since then he reports that he has been doing well.  He completed his Botox  injection, and feels like his memory is normal and has not gotten worse.  Wife feels that his main problem is his hearing loss.  He does have hearing loss but has not been evaluated with audiogram and he is not using hearing aid.  She feel like sometimes she has to repeat herself due to patient not hearing her.  He does have word finding difficulty but he still independent in all activities of daily living.   INTERVAL HISTORY 05/07/22: Patient presents today for follow-up, at last visit in May, plan was to start Botox  injection for his left hemifacial spasm.  He is scheduled for the first botox   injection on July 26.  Today she is presenting with wife due to memory problem.   Patient reports that his memory problems and confusion started while being diagnosed with COVID in 2021.  Since then he has problems remembering, but his main problem is word finding difficulty.  Wife reported he is repetitive, she has to remind him.  And mainly he has problem getting the words out.  Patient also report episode of confusion.  He states yesterday he changed the car motor oil but he was not sure if he did it right so he had to go back and look over his steps over again twice.  He reports changing car motor oils for more than 20 years without any difficulty.  He has trouble falling asleep, feels sleepy during the daytime and currently, he is being evaluated for sleep apnea.    HISTORY OF PRESENT ILLNESS:  This is a 78 year old [place] vet who is presenting with complaint of blepharospasm.  Patient reports a long history of eye disease.  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  He also report bilateral cataract surgery. Starting in 1990 he was diagnosed with left blepharospasm.  He was treated with Botox  by Dr. Milton.  Patient reported last Botox  treatment was 4 years ago.  Since then he has been having increased blepharospasm, increase  left hemifacial spasm sometimes involving the cheek and the left upper lip.  This affects his day-to-day activity and mostly when driving.  He is interested in receiving Botox  treatment again.   OTHER MEDICAL CONDITIONS: Hypothyroidism, hypotension, gout   REVIEW OF SYSTEMS: Full 14 system review of systems performed and negative with exception of:  as noted in the HPI   ALLERGIES: No Known Allergies  HOME MEDICATIONS: Outpatient Medications Prior to Visit  Medication Sig Dispense Refill   allopurinol  (ZYLOPRIM ) 300 MG tablet Take 300 mg by mouth daily.     ALPRAZolam  (XANAX ) 0.5 MG tablet Take  0.25-0.5 mg by mouth at bedtime as needed for sleep.     Azelastine-Fluticasone 137-50 MCG/ACT SUSP Place 1 spray into both nostrils 2 (two) times daily.     cetirizine HCl (ZYRTEC) 1 MG/ML solution Take 10 mg by mouth daily.     hydrocortisone  (ANUSOL -HC) 2.5 % rectal cream Place 1 Application rectally 2 (two) times daily. As needed for rectal bleeding 30 g 1   ketoconazole (NIZORAL) 2 % cream Apply 1 application topically daily as needed (foot rash).     levothyroxine  (SYNTHROID ) 50 MCG tablet Take 50 mcg by mouth daily before breakfast.  10   lisinopril-hydrochlorothiazide  (ZESTORETIC) 20-12.5 MG tablet Take 1 tablet by mouth daily.     polyethylene glycol powder (GLYCOLAX /MIRALAX ) 17 GM/SCOOP powder Take by mouth daily.     traZODone (DESYREL) 100 MG tablet Take 100 mg by mouth daily.     ergocalciferol (VITAMIN D2) 1.25 MG (50000 UT) capsule Take 50,000 Units by mouth once a week. (Patient not taking: Reported on 05/07/2024)     Flaxseed, Linseed, (FLAXSEED OIL) 1000 MG CAPS  (Patient not taking: Reported on 05/07/2024)     hydrochlorothiazide  (HYDRODIURIL ) 12.5 MG tablet Take 1 tablet (12.5 mg total) by mouth daily. 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) 10/07/2023   Difficulty walking 04/07/2014   IMO SNOMED Dx Update Oct 2024     Diverticulosis    Essential (primary) hypertension 10/07/2023   Gastroesophageal reflux disease without esophagitis 10/07/2023   Gout    Hemifacial spasm    left side affected takes Artane  daily;    Hemifacial spasm of left side of face 05/18/2022   Hemorrhoids 09/11/2022   History of bronchitis 11/2013    Hypertension    takes Ziac  daily   Impairment of balance 04/07/2014   Joint pain    Memory loss    Primary osteoarthritis 10/07/2023   Primary osteoarthritis of hip 08/26/2018   Proximal leg weakness 04/07/2014   Type 2 diabetes mellitus with diabetic neuropathy (HCC) 10/07/2023    PAST SURGICAL HISTORY: Past Surgical History:  Procedure Laterality Date   CATARACT EXTRACTION, BILATERAL Bilateral 2015, 2016   COLONOSCOPY     COLONOSCOPY N/A 12/18/2017   diverticulosis and internal hemorhoids.   COLONOSCOPY WITH PROPOFOL  N/A 08/07/2022   Procedure: COLONOSCOPY WITH PROPOFOL ;  Surgeon: Cindie Carlin POUR, DO;  Location: [facility];  Service: Endoscopy;  Laterality: N/A;  11:00 am   eye muschle surgeries  as a child   x 5   HERNIA  REPAIR Left    as a child-inguinal;right inguinal in 1969   POLYPECTOMY  08/07/2022   Procedure: POLYPECTOMY;  Surgeon: Cindie Carlin POUR, DO;  Location: [facility];  Service: Endoscopy;;   TOTAL HIP ARTHROPLASTY Right 03/24/2014   Procedure: TOTAL HIP ARTHROPLASTY ANTERIOR APPROACH;  Surgeon: Toribio JULIANNA Chancy, MD;  Location: [facility] OR;  Service: Orthopedics;  Laterality: Right;   TOTAL HIP ARTHROPLASTY Left 08/26/2018   Procedure: LEFT TOTAL HIP ARTHROPLASTY ANTERIOR APPROACH;  Surgeon: Chancy Evalene BIRCH, MD;  Location: [facility];  Service: Orthopedics;  Laterality: Left;    FAMILY HISTORY: Family History  Problem Relation Age of Onset   Aneurysm Mother        Brain   Dementia Mother    Heart attack Father    Colon cancer Neg Hx    Colon polyps Neg Hx     SOCIAL HISTORY: Social History   Socioeconomic History   Marital status: Married    Spouse name: Not on file   Number of children: 2   Years of education: Not on file   Highest education level: Bachelor's degree (e.g., BA, AB, BS)  Occupational History   Not on file  Tobacco Use   Smoking status: Never   Smokeless tobacco: Never  Vaping Use   Vaping status: Never Used  Substance and  Sexual Activity   Alcohol use: Yes    Alcohol/week: 7.0 standard drinks of alcohol    Types: 7 Glasses of wine per week   Drug use: No   Sexual activity: Yes    Partners: Female    Birth control/protection: None    Comment: married  Other Topics Concern   Not on file  Social History Narrative   Right handed   Caffiene- 2 cups daily   Married   Retired   Chief Executive Officer Drivers of Corporate investment banker Strain: Not on Ship broker Insecurity: Not on file  Transportation Needs: Not on file  Physical Activity: Not on file  Stress: Not on file  Social Connections: Not on file  Intimate Partner Violence: Not on file    PHYSICAL EXAM  GENERAL EXAM/CONSTITUTIONAL: Vitals:  Vitals:   05/07/24 1100  BP: 130/74  Weight: 204 lb (92.5 kg)  Height: 5' 8 (1.727 m)   Body mass index is 31.02 kg/m. Wt Readings from Last 3 Encounters:  05/07/24 204 lb (92.5 kg)  01/15/24 215 lb 12.8 oz (97.9 kg)  11/20/23 221 lb (100.2 kg)   Patient is in no distress; well developed, nourished and groomed; neck is supple  MUSCULOSKELETAL: Gait, strength, tone, movements noted in Neurologic exam below  NEUROLOGIC: MENTAL STATUS:     05/07/2024   11:07 AM  MMSE - Mini Mental State Exam  Not completed: Unable to complete        05/07/2024   11:01 AM 05/08/2023   10:51 AM 05/07/2022    3:02 PM  Montreal Cognitive Assessment   Visuospatial/ Executive (0/5) 2 1 5   Naming (0/3) 3 3 3   Attention: Read list of digits (0/2)  1 0  Attention: Read list of letters (0/1)  1 1  Attention: Serial 7 subtraction starting at 100 (0/3)  2 2  Language: Repeat phrase (0/2)  0 0  Language : Fluency (0/1)  1 1  Abstraction (0/2)  2 2  Delayed Recall (0/5)  0 0  Orientation (0/6)  4 6  Total  15 20  Adjusted Score (based on education)  15  CRANIAL NERVE:  2nd, 3rd, 4th, 6th - visual fields full to confrontation, extraocular muscles intact, no nystagmus 5th - facial sensation symmetric 7th - left  hemifacial spasms noted, left eye mainly closed shut during the interview   8th - hearing intact 9th - palate elevates symmetrically, uvula midline 11th - shoulder shrug symmetric 12th - tongue protrusion midline  MOTOR:  normal bulk and tone, full strength in the BUE, BLE  SENSORY:  normal and symmetric to light touch  COORDINATION:  finger-nose-finger, fine finger movements normal  GAIT/STATION:  normal   DIAGNOSTIC DATA (LABS, IMAGING, TESTING) - I reviewed patient records, labs, notes, testing and imaging myself where available.  Lab Results  Component Value Date   WBC 6.9 08/18/2018   HGB 16.3 08/18/2018   HCT 48.8 08/18/2018   MCV 99.0 08/18/2018   PLT 161 08/18/2018      Component Value Date/Time   NA 135 08/18/2018 1148   K 4.2 08/18/2018 1148   CL 100 08/18/2018 1148   CO2 26 08/18/2018 1148   GLUCOSE 107 (H) 08/18/2018 1148   BUN 29 (H) 08/18/2018 1148   CREATININE 1.40 (H) 10/04/2023 1456   CALCIUM 9.1 08/18/2018 1148   PROT 7.5 03/17/2014 1223   ALBUMIN  4.1 03/17/2014 1223   AST 24 03/17/2014 1223   ALT 25 03/17/2014 1223   ALKPHOS 64 03/17/2014 1223   BILITOT 0.5 03/17/2014 1223   GFRNONAA 56 (L) 08/18/2018 1148   GFRAA >60 08/18/2018 1148   No results found for: CHOL, HDL, LDLCALC, LDLDIRECT, TRIG, CHOLHDL No results found for: YHAJ8R No results found for: VITAMINB12 No results found for: TSH   MRI Brain 05/24/2022 1. No evidence of acute intracranial abnormality. 2. Moderate scattered T2/FLAIR hyperintensities within the white matter, nonspecific but most likely secondary to chronic microvascular ischemic disease. Chronic demyelination is a differential consideration, but thought less likely given patient age. 3.  Cerebral Atrophy (ICD10-G31.9).   ASSESSMENT AND PLAN  78 y.o. year old male with history of gout, hypothyroidism, left hemifacial spasm who is presenting for follow up for his mild cognitive impairment.  He feels  that his memory is stable even though wife reports that patient repeats himself and now has word finding difficulty. He remains independent in his ADLs.  Advised him to follow up with audiology and follow up in a year   1. Mild cognitive impairment   2. Word finding difficulty   3. Hemifacial spasm of left side of face     Patient Instructions  Continue current medications  Continue to follow up with audiology  He has decline additional botox  therapy for his left hemifacial spasm  Return in a year or sooner if worse    There are well-accepted and sensible ways to reduce risk for Alzheimers disease and other degenerative brain disorders .  Exercise Daily Walk A daily 20 minute walk should be part of your routine. Disease related apathy can be a significant roadblock to exercise and the only way to overcome this is to make it a daily routine and perhaps have a reward at the end (something your loved one loves to eat or drink perhaps) or a personal trainer coming to the home can also be very useful. Most importantly, the patient is much more likely to exercise if the caregiver / spouse does it with him/her. In general a structured, repetitive schedule is best.  General Health: Any diseases which effect your body will effect your brain such as a pneumonia, urinary  infection, blood clot, heart attack or stroke. Keep contact with your primary care doctor for regular follow ups.  Sleep. A good nights sleep is healthy for the brain. Seven hours is recommended. If you have insomnia or poor sleep habits we can give you some instructions. If you have sleep apnea wear your mask.  Diet: Eating a heart healthy diet is also a good idea; fish and poultry instead of red meat, nuts (mostly non-peanuts), vegetables, fruits, olive oil or canola oil (instead of butter), minimal salt (use other spices to flavor foods), whole grain rice, bread, cereal and pasta and wine in moderation.Research is now showing that  the MIND diet, which is a combination of The Mediterranean diet and the DASH diet, is beneficial for cognitive processing and longevity. Information about this diet can be found in The MIND Diet, a book by Annitta Feeling, MS, RDN, and online at [employer]  Finances, Power of [address] and Advance Directives: You should consider putting legal safeguards in place with regard to financial and medical decision making. While the spouse always has power of attorney for medical and financial issues in the absence of any form, you should consider what you want in case the spouse / caregiver is no longer around or capable of making decisions.   No orders of the defined types were placed in this encounter.   No orders of the defined types were placed in this encounter.   Return in about 1 year (around 05/07/2025).   Pastor Falling, MD 05/07/2024, 5:11 PM  [facility] [address] [place], [place] [address] [phone]

## 2024-05-19 DIAGNOSIS — Z01110 Encounter for hearing examination following failed hearing screening: Secondary | ICD-10-CM | POA: Diagnosis not present

## 2024-06-09 ENCOUNTER — Ambulatory Visit

## 2024-06-09 VITALS — BP 120/74 | HR 66 | Ht 68.0 in | Wt 206.1 lb

## 2024-06-09 DIAGNOSIS — F5101 Primary insomnia: Secondary | ICD-10-CM | POA: Diagnosis not present

## 2024-06-09 DIAGNOSIS — E039 Hypothyroidism, unspecified: Secondary | ICD-10-CM | POA: Insufficient documentation

## 2024-06-09 DIAGNOSIS — R7301 Impaired fasting glucose: Secondary | ICD-10-CM | POA: Insufficient documentation

## 2024-06-09 DIAGNOSIS — I10 Essential (primary) hypertension: Secondary | ICD-10-CM | POA: Diagnosis not present

## 2024-06-09 DIAGNOSIS — M109 Gout, unspecified: Secondary | ICD-10-CM | POA: Insufficient documentation

## 2024-06-09 DIAGNOSIS — E559 Vitamin D deficiency, unspecified: Secondary | ICD-10-CM | POA: Insufficient documentation

## 2024-06-09 MED ORDER — LISINOPRIL-HYDROCHLOROTHIAZIDE 20-12.5 MG PO TABS: 1.0000 | ORAL_TABLET | Freq: Every day | ORAL | 2 refills | Status: AC

## 2024-06-09 MED ORDER — TRAZODONE HCL 100 MG PO TABS: 100.0000 mg | ORAL_TABLET | Freq: Every day | ORAL | 2 refills | Status: DC

## 2024-06-09 NOTE — Progress Notes (Unsigned)
 New Patient Office Visit  Subjective    Patient ID: Erik Odonnell, male    DOB: 1946/09/07  Age: 78 y.o. MRN: [id number]  CC:  Chief Complaint  Patient presents with   Establish Care    Pt her to establish care, Erik Odonnell for his sciatic     HPI [name] presents to establish care Patient is here to establish care from Dr. Auther office.   Outpatient Encounter Medications as of 06/09/2024  Medication Sig   allopurinol  (ZYLOPRIM ) 300 MG tablet Take 300 mg by mouth daily.   ALPRAZolam  (XANAX ) 0.5 MG tablet Take 0.25-0.5 mg by mouth at bedtime as needed for sleep.   cetirizine HCl (ZYRTEC) 1 MG/ML solution Take 10 mg by mouth daily.   hydrocortisone  (ANUSOL -HC) 2.5 % rectal cream Place 1 Application rectally 2 (two) times daily. As needed for rectal bleeding   ketoconazole (NIZORAL) 2 % cream Apply 1 application topically daily as needed (foot rash).   levothyroxine  (SYNTHROID ) 50 MCG tablet Take 50 mcg by mouth daily before breakfast.   polyethylene glycol powder (GLYCOLAX /MIRALAX ) 17 GM/SCOOP powder Take by mouth daily.   [DISCONTINUED] lisinopril -hydrochlorothiazide  (ZESTORETIC ) 20-12.5 MG tablet Take 1 tablet by mouth daily.   [DISCONTINUED] traZODone  (DESYREL ) 100 MG tablet Take 100 mg by mouth daily.   lisinopril -hydrochlorothiazide  (ZESTORETIC ) 20-12.5 MG tablet Take 1 tablet by mouth daily.   traZODone  (DESYREL ) 100 MG tablet Take 1 tablet (100 mg total) by mouth daily.   [DISCONTINUED] Azelastine-Fluticasone 137-50 MCG/ACT SUSP Place 1 spray into both nostrils 2 (two) times daily.   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) 10/07/2023   Difficulty walking 04/07/2014   IMO SNOMED Dx Update Oct 2024     Diverticulosis    Essential  (primary) hypertension 10/07/2023   Gastroesophageal reflux disease without esophagitis 10/07/2023   Gout    Hemifacial spasm    left side affected takes Artane  daily;    Hemifacial spasm of left side of face 05/18/2022   Hemorrhoids 09/11/2022   History of bronchitis 11/2013   Hypertension    takes Ziac  daily   Impairment of balance 04/07/2014   Joint pain    Memory loss    Primary osteoarthritis 10/07/2023   Primary osteoarthritis of hip 08/26/2018   Proximal leg weakness 04/07/2014   Type 2 diabetes mellitus with diabetic neuropathy (HCC) 10/07/2023    Past Surgical History:  Procedure Laterality Date   CATARACT EXTRACTION, BILATERAL Bilateral 2015, 2016   COLONOSCOPY     COLONOSCOPY N/A 12/18/2017   diverticulosis and internal hemorhoids.   COLONOSCOPY WITH PROPOFOL  N/A 08/07/2022   Procedure: COLONOSCOPY WITH PROPOFOL ;  Surgeon: Cindie Carlin POUR, DO;  Location: [facility];  Service: Endoscopy;  Laterality: N/A;  11:00 am   eye muschle surgeries  as a child   x 5   HERNIA REPAIR Left    as a child-inguinal;right inguinal in 1969   POLYPECTOMY  08/07/2022   Procedure: POLYPECTOMY;  Surgeon: Cindie Carlin POUR, DO;  Location: [facility];  Service: Endoscopy;;   TOTAL HIP ARTHROPLASTY Right 03/24/2014   Procedure: TOTAL HIP ARTHROPLASTY ANTERIOR APPROACH;  Surgeon: Toribio JULIANNA Chancy, MD;  Location: [facility] OR;  Service: Orthopedics;  Laterality: Right;   TOTAL HIP ARTHROPLASTY Left 08/26/2018  Procedure: LEFT TOTAL HIP ARTHROPLASTY ANTERIOR APPROACH;  Surgeon: Beverley Evalene BIRCH, MD;  Location: [facility];  Service: Orthopedics;  Laterality: Left;    Family History  Problem Relation Age of Onset   Aneurysm Mother        Brain   Dementia Mother    Heart attack Father    Colon cancer Neg Hx    Colon polyps Neg Hx     Social History   Socioeconomic History   Marital status: Married    Spouse name: Not on file   Number of children: 2   Years of education: Not on file    Highest education level: Bachelor's degree (e.g., BA, AB, BS)  Occupational History   Not on file  Tobacco Use   Smoking status: Never   Smokeless tobacco: Never  Vaping Use   Vaping status: Never Used  Substance and Sexual Activity   Alcohol use: Yes    Alcohol/week: 7.0 standard drinks of alcohol    Types: 7 Glasses of wine per week   Drug use: No   Sexual activity: Yes    Partners: Female    Birth control/protection: None    Comment: married  Other Topics Concern   Not on file  Social History Narrative   Right handed   Caffiene- 2 cups daily   Married   Retired   Chief Executive Officer Drivers of Corporate investment banker Strain: Low Risk  (06/05/2024)   Overall Financial Resource Strain (CARDIA)    Difficulty of Paying Living Expenses: Not hard at all  Food Insecurity: No Food Insecurity (06/05/2024)   Hunger Vital Sign    Worried About Running Out of Food in the Last Year: Never true    Ran Out of Food in the Last Year: Never true  Transportation Needs: No Transportation Needs (06/05/2024)   PRAPARE - Administrator, Civil Service (Medical): No    Lack of Transportation (Non-Medical): No  Physical Activity: Not on file  Stress: Stress Concern Present (06/05/2024)   [employer] of Occupational Health - Occupational Stress Questionnaire    Feeling of Stress: To some extent  Social Connections: Unknown (06/05/2024)   Social Connection and Isolation Panel    Frequency of Communication with Friends and Family: Not on file    Frequency of Social Gatherings with Friends and Family: Twice a week    Attends Religious Services: Patient declined    Database administrator or Organizations: No    Attends Engineer, structural: Not on file    Marital Status: Married  Catering manager Violence: Not on file    ROS     Objective    BP 120/74 (BP Location: Left Arm, Patient Position: Sitting, Cuff Size: Normal)   Pulse 66   Ht 5' 8 (1.727 m)   Wt 206 lb 1.9 oz  (93.5 kg)   SpO2 94%   BMI 31.34 kg/m   Physical Exam Vitals and nursing note reviewed.  Constitutional:      Appearance: Normal appearance.  HENT:     Head: Normocephalic.  Eyes:     Extraocular Movements: Extraocular movements intact.     Pupils: Pupils are equal, round, and reactive to light.  Cardiovascular:     Rate and Rhythm: Normal rate and regular rhythm.  Pulmonary:     Effort: Pulmonary effort is normal.     Breath sounds: Normal breath sounds.  Abdominal:     General: Bowel sounds are normal.  Palpations: Abdomen is soft.  Musculoskeletal:     Cervical back: Normal range of motion and neck supple.  Skin:    General: Skin is warm and dry.  Neurological:     Mental Status: He is alert and oriented to person, place, and time.  Psychiatric:        Mood and Affect: Mood normal.        Thought Content: Thought content normal.         Assessment & Plan:   Problem List Items Addressed This Visit       Cardiovascular and Mediastinum   Essential (primary) hypertension - Primary (Chronic)   Stable with current dose of lisinopril -hydrochlorothiazide .  Refills provided.       Relevant Medications   lisinopril -hydrochlorothiazide  (ZESTORETIC ) 20-12.5 MG tablet     Other   Primary insomnia   Stable with trazodone .  Refills provided.       Relevant Medications   traZODone  (DESYREL ) 100 MG tablet    No follow-ups on file.   Leita Longs, FNP

## 2024-06-10 DIAGNOSIS — F5101 Primary insomnia: Secondary | ICD-10-CM | POA: Insufficient documentation

## 2024-06-10 DIAGNOSIS — G4700 Insomnia, unspecified: Secondary | ICD-10-CM | POA: Insufficient documentation

## 2024-06-10 NOTE — Assessment & Plan Note (Signed)
 Stable with current dose of lisinopril -hydrochlorothiazide .  Refills provided.

## 2024-06-10 NOTE — Assessment & Plan Note (Signed)
 Stable with trazodone .  Refills provided.

## 2024-06-11 ENCOUNTER — Ambulatory Visit: Payer: Self-pay | Admitting: Internal Medicine

## 2024-07-17 ENCOUNTER — Ambulatory Visit (HOSPITAL_COMMUNITY)
Admission: RE | Admit: 2024-07-17 | Discharge: 2024-07-17 | Disposition: A | Discharge status: Home / Self Care | Source: Ambulatory Visit | Attending: Pulmonary Disease | Admitting: Pulmonary Disease

## 2024-07-17 DIAGNOSIS — R918 Other nonspecific abnormal finding of lung field: Secondary | ICD-10-CM | POA: Diagnosis not present

## 2024-07-17 DIAGNOSIS — I2510 Atherosclerotic heart disease of native coronary artery without angina pectoris: Secondary | ICD-10-CM | POA: Diagnosis not present

## 2024-07-17 DIAGNOSIS — R0600 Dyspnea, unspecified: Secondary | ICD-10-CM | POA: Diagnosis not present

## 2024-07-17 DIAGNOSIS — J8410 Pulmonary fibrosis, unspecified: Secondary | ICD-10-CM | POA: Diagnosis not present

## 2024-07-24 ENCOUNTER — Ambulatory Visit: Payer: Self-pay | Admitting: Pulmonary Disease

## 2024-07-26 ENCOUNTER — Other Ambulatory Visit: Payer: Self-pay | Admitting: Gastroenterology

## 2024-08-05 ENCOUNTER — Encounter (INDEPENDENT_AMBULATORY_CARE_PROVIDER_SITE_OTHER): Payer: Self-pay | Admitting: Gastroenterology

## 2024-08-10 ENCOUNTER — Ambulatory Visit (HOSPITAL_COMMUNITY): Admitting: Registered Nurse

## 2024-08-10 ENCOUNTER — Encounter (HOSPITAL_COMMUNITY): Payer: Self-pay | Admitting: Registered Nurse

## 2024-08-10 VITALS — BP 134/79 | HR 68 | Ht 68.0 in | Wt 211.8 lb

## 2024-08-10 DIAGNOSIS — F411 Generalized anxiety disorder: Secondary | ICD-10-CM | POA: Diagnosis not present

## 2024-08-10 DIAGNOSIS — Z79899 Other long term (current) drug therapy: Secondary | ICD-10-CM

## 2024-08-10 DIAGNOSIS — G4700 Insomnia, unspecified: Secondary | ICD-10-CM

## 2024-08-10 DIAGNOSIS — F330 Major depressive disorder, recurrent, mild: Secondary | ICD-10-CM | POA: Diagnosis not present

## 2024-08-10 DIAGNOSIS — F5101 Primary insomnia: Secondary | ICD-10-CM | POA: Diagnosis not present

## 2024-08-10 MED ORDER — BUSPIRONE HCL 5 MG PO TABS: 5.0000 mg | ORAL_TABLET | Freq: Two times a day (BID) | ORAL | 1 refills | Status: DC

## 2024-08-10 MED ORDER — TRAZODONE HCL 100 MG PO TABS: 100.0000 mg | ORAL_TABLET | Freq: Every day | ORAL | 0 refills | Status: AC

## 2024-08-10 MED ORDER — HYDROXYZINE HCL 10 MG PO TABS: 10.0000 mg | ORAL_TABLET | Freq: Three times a day (TID) | ORAL | 1 refills | Status: DC | PRN

## 2024-08-10 NOTE — Progress Notes (Signed)
 Psychiatric Initial Adult Assessment   Patient Identification: Erik Odonnell MRN:  [id number] Date of Evaluation:  08/10/2024 Referral Source: Dr. Bertell Chief Complaint:   Chief Complaint  Patient presents with   Establish Care    Medication management   Visit Diagnosis:    ICD-10-CM   1. GAD (generalized anxiety disorder)  F41.1 busPIRone (BUSPAR) 5 MG tablet    hydrOXYzine (ATARAX) 10 MG tablet    2. Primary insomnia  F51.01 traZODone  (DESYREL ) 100 MG tablet    3. Mild recurrent major depression  F33.0 busPIRone (BUSPAR) 5 MG tablet    traZODone  (DESYREL ) 100 MG tablet    4. Insomnia, unspecified type  G47.00 hydrOXYzine (ATARAX) 10 MG tablet    traZODone  (DESYREL ) 100 MG tablet    5. On psychotropic medication  Z79.899 TSH    CBC with Differential    Comprehensive metabolic panel with GFR    Magnesium     Prolactin    B12    Lipid panel      History of Present Illness:  Erik Odonnell 78 y.o. male presents today to establish care for medication management.  He was seen face-to-face by this provide and chart reviewed on 08/10/24  His psychiatric history is significant for anxiety and insomnia.  His mental health is currently managed with trazodone  100 mg daily at bedtime as needed.  He reports he was prescribed Xanax  0.5 mg that he will take at bedtime to help with sleep also but was recommended to come off of it.  Informed him that there were other medications that could also help with his anxiety discussed BuSpar and Vistaril.  He reports the trazodone  helps with sleep but sometimes he would need a little extra.  He reports he is eating without any difficulty.  He reports the only stressor he has is just getting older and not being able to do the things that he used to do.  He denies prior psychiatric history other than anxiety and insomnia which he was prescribed Xanax  and trazodone .  Has been off of Xanax  since May 2025.  He reports he does have a problem with his memory at  times that occurred after getting COVID. Today he denies suicidal ideation, self-harm, homicidal ideation, psychosis, paranoia, and abnormal movements. Screenings completed during today's visit PHQ-9, C-SSRS, GAD-7, AIMS, AUDIT, Nutrition, and Pain, see scores below.    Recommendations: Continue trazodone  100 mg.  Start BuSpar 5 mg twice daily, and Vistaril 10 mg 3 times daily as needed anxiety, sleep. He was educated on the side effect and efficacy profile of BuSpar, Vistaril and educational material was added to AVS. Informed that therapeutic effects may take several weeks to become noticeable.  He voiced understanding and agreement with today's plan and recommendations.  Associated Signs/Symptoms: Depression Symptoms:  Denies (Hypo) Manic Symptoms:  Denies Anxiety Symptoms:  Reported some worrying but does not feel it is excessive Psychotic Symptoms:  Denies PTSD Symptoms: NA  Past Psychiatric History:  Diagnosis: Anxiety, insomnia Suicide attempt: Non-suicidal self-injurious behavior: Denies Psychiatric hospitalization: Denies Prior outpatient psychiatric services: Denies Past trauma: Denies Substance abuse: Denies illicit drug use but states he does drink 1 glass of wine daily with supper Past psychotropic medication trials: Reporting only psychotropic medication he is ever taking is the trazodone  and Xanax   Previous Psychotropic Medications: Yes   Substance Abuse History in the last 12 months:  No.  Consequences of Substance Abuse: NA  Past Medical History:  Past Medical History:  Diagnosis Date  Arthritis    Cognitive impairment 05/08/2022   Degenerative disease of central nervous system 10/07/2023   Difficulty walking 04/07/2014   IMO SNOMED Dx Update Oct 2024     Diverticulosis    Essential (primary) hypertension 10/07/2023   Gastroesophageal reflux disease without esophagitis 10/07/2023   Gout    Hemifacial spasm    left side affected takes Artane  daily;     Hemifacial spasm of left side of face 05/18/2022   Hemorrhoids 09/11/2022   History of bronchitis 11/2013   Hypertension    takes Ziac  daily   Impairment of balance 04/07/2014   Joint pain    Memory loss    Primary osteoarthritis 10/07/2023   Primary osteoarthritis of hip 08/26/2018   Proximal leg weakness 04/07/2014   Type 2 diabetes mellitus with diabetic neuropathy (HCC) 10/07/2023    Past Surgical History:  Procedure Laterality Date   CATARACT EXTRACTION, BILATERAL Bilateral 2015, 2016   COLONOSCOPY     COLONOSCOPY N/A 12/18/2017   diverticulosis and internal hemorhoids.   COLONOSCOPY WITH PROPOFOL  N/A 08/07/2022   Procedure: COLONOSCOPY WITH PROPOFOL ;  Surgeon: Cindie Carlin POUR, DO;  Location: [facility];  Service: Endoscopy;  Laterality: N/A;  11:00 am   eye muschle surgeries  as a child   x 5   HERNIA REPAIR Left    as a child-inguinal;right inguinal in 1969   POLYPECTOMY  08/07/2022   Procedure: POLYPECTOMY;  Surgeon: Cindie Carlin POUR, DO;  Location: [facility];  Service: Endoscopy;;   TOTAL HIP ARTHROPLASTY Right 03/24/2014   Procedure: TOTAL HIP ARTHROPLASTY ANTERIOR APPROACH;  Surgeon: Toribio JULIANNA Chancy, MD;  Location: [facility] OR;  Service: Orthopedics;  Laterality: Right;   TOTAL HIP ARTHROPLASTY Left 08/26/2018   Procedure: LEFT TOTAL HIP ARTHROPLASTY ANTERIOR APPROACH;  Surgeon: Chancy Evalene BIRCH, MD;  Location: [facility];  Service: Orthopedics;  Laterality: Left;    Family Psychiatric History: Denies family psychiatric history  Family History:  Family History  Problem Relation Age of Onset   Aneurysm Mother        Brain   Dementia Mother    Heart attack Father    Colon cancer Neg Hx    Colon polyps Neg Hx     Social History:   Social History   Socioeconomic History   Marital status: Married    Spouse name: Not on file   Number of children: 2   Years of education: Not on file   Highest education level: Bachelor's degree (e.g., BA, AB, BS)   Occupational History   Not on file  Tobacco Use   Smoking status: Never   Smokeless tobacco: Never  Vaping Use   Vaping status: Never Used  Substance and Sexual Activity   Alcohol use: Yes    Alcohol/week: 7.0 standard drinks of alcohol    Types: 7 Glasses of wine per week   Drug use: No   Sexual activity: Yes    Partners: Female    Birth control/protection: None    Comment: married  Other Topics Concern   Not on file  Social History Narrative   Right handed   Caffiene- 2 cups daily   Married   Retired   Social Drivers of Corporate investment banker Strain: Low Risk  (06/05/2024)   Overall Financial Resource Strain (CARDIA)    Difficulty of Paying Living Expenses: Not hard at all  Food Insecurity: No Food Insecurity (06/05/2024)   Hunger Vital Sign    Worried About Running Out  of Food in the Last Year: Never true    Ran Out of Food in the Last Year: Never true  Transportation Needs: No Transportation Needs (06/05/2024)   PRAPARE - Administrator, Civil Service (Medical): No    Lack of Transportation (Non-Medical): No  Physical Activity: Not on file  Stress: Stress Concern Present (06/05/2024)   [employer] of Occupational Health - Occupational Stress Questionnaire    Feeling of Stress: To some extent  Social Connections: Unknown (06/05/2024)   Social Connection and Isolation Panel    Frequency of Communication with Friends and Family: Not on file    Frequency of Social Gatherings with Friends and Family: Twice a week    Attends Religious Services: Patient declined    Database administrator or Organizations: No    Attends Engineer, structural: Not on file    Marital Status: Married    Additional Social History: Reports of 4 years in the [place].  He is a retired Chartered loss adjuster.  He lives with his wife.  He has 2 daughters both live in [place].  Reports family is supportive  Allergies:  No Known Allergies  Metabolic Disorder Labs: No results  found for: HGBA1C, MPG No results found for: PROLACTIN No results found for: CHOL, TRIG, HDL, CHOLHDL, VLDL, LDLCALC No results found for: TSH  Lab Orders         TSH         CBC with Differential         Comprehensive metabolic panel with GFR         Magnesium          Prolactin         B12         Lipid panel      Current Medications: Current Outpatient Medications  Medication Sig Dispense Refill   allopurinol  (ZYLOPRIM ) 300 MG tablet Take 300 mg by mouth daily.     ALPRAZolam  (XANAX ) 0.5 MG tablet Take 0.25-0.5 mg by mouth at bedtime as needed for sleep.     busPIRone (BUSPAR) 5 MG tablet Take 1 tablet (5 mg total) by mouth 2 (two) times daily. 60 tablet 1   cetirizine HCl (ZYRTEC) 1 MG/ML solution Take 10 mg by mouth daily.     hydrocortisone  (ANUSOL -HC) 2.5 % rectal cream Place 1 Application rectally 2 (two) times daily. As needed for rectal bleeding 30 g 1   hydrOXYzine (ATARAX) 10 MG tablet Take 1 tablet (10 mg total) by mouth 3 (three) times daily as needed for anxiety Lonnie). 60 tablet 1   ketoconazole (NIZORAL) 2 % cream Apply 1 application topically daily as needed (foot rash).     levothyroxine  (SYNTHROID ) 50 MCG tablet Take 50 mcg by mouth daily before breakfast.  10   lisinopril -hydrochlorothiazide  (ZESTORETIC ) 20-12.5 MG tablet Take 1 tablet by mouth daily. 90 tablet 2   polyethylene glycol powder (GLYCOLAX /MIRALAX ) 17 GM/SCOOP powder Take by mouth daily.     traZODone  (DESYREL ) 100 MG tablet Take 1 tablet (100 mg total) by mouth daily. 90 tablet 0   Current Facility-Administered Medications  Medication Dose Route Frequency Provider Last Rate Last Admin   botulinum toxin Type A  (BOTOX ) injection 100 Units  100 Units Intramuscular Once Onita Duos, MD        Musculoskeletal: Strength & Muscle Tone: within normal limits Gait & Station: normal Patient leans: N/A  Psychiatric Specialty Exam: Review of Systems  Constitutional:        No  other  complaints at this time  Eyes:  Negative for pain, discharge and itching.       Reports he is unable to see out of his left eye.  Reports at least 5 surgeries on it during his childhood.  At this time believe it is closed.  Wearing glasses unable to see out of right eye.  Neurological:        Reports some issues with forgetfulness since infected with COVID was unable to tell the exact year he was treated for COVID  Psychiatric/Behavioral:  Negative for agitation, decreased concentration, dysphoric mood, sleep disturbance and suicidal ideas. The patient is not nervous/anxious.   All other systems reviewed and are negative.   Blood pressure 134/79, pulse 68, height 5' 8 (1.727 m), weight 211 lb 12.8 oz (96.1 kg), SpO2 94%.Body mass index is 32.2 kg/m.  General Appearance: Casual and Neat  Eye Contact:  Good  Speech:  Clear and Coherent and Normal Rate  Volume:  Normal  Mood:  Euthymic  Affect:  Appropriate and Congruent  Thought Process:  Coherent, Goal Directed, and Descriptions of Associations: Intact  Orientation:  Full (Time, Place, and Person)  Thought Content:  Logical  Suicidal Thoughts:  No  Homicidal Thoughts:  No  Memory:  Immediate;   Good Recent;   Fair Remote;   Good  Judgement:  Intact  Insight:  Present  Psychomotor Activity:  Normal  Concentration:  Concentration: Good and Attention Span: Good  Recall:  Good  Fund of Knowledge:Good  Language: Good  Akathisia:  No  Handed:  Right  AIMS (if indicated):  done  Assets:  Communication Skills Desire for Improvement Financial Resources/Insurance Housing Leisure Time Physical Health Resilience Social Support Transportation  ADL's:  Intact  Cognition: WNL  Sleep:  Good   Screenings: AIMS    Flowsheet Row Office Visit from 08/10/2024 in [facility] [place]  AIMS Total Score 0   AUDIT    Flowsheet Row Office Visit from 06/09/2024 in [facility]   Alcohol Use Disorder Identification Test Final Score (AUDIT) 4    GAD-7    Flowsheet Row Office Visit from 08/10/2024 in [facility] [place]  Total GAD-7 Score 4   PHQ2-9    Flowsheet Row Office Visit from 08/10/2024 in [facility] Total Score 0  PHQ-9 Total Score 1   Flowsheet Row Office Visit from 08/10/2024 in [facility] [place] Admission (Discharged) from 08/07/2022 in [facility]  C-SSRS RISK CATEGORY No Risk No Risk    Assessment and Plan:  Assessment: Summary of today's assessment: Erik Odonnell appears to be doing well.  He reports he has problems with his memory at times.  He has a note with a list of current medications written on it.  Reports that his wife usually comes with him to visits but she was unable to come today.  Reports he is currently taking trazodone  100 mg for sleep and it does well as the formication he may need something extra.  Reporting he was prescribed Xanax  0.25 mg that he took as needed at bedtime but was recommended that he stop.  Last prescription noted in PDMP was 03/09/2024.  He was started on BuSpar and Vistaril.  He reports he is eating without any difficulty.  He denies suicidal/self-harm/homicidal ideation, psychosis, paranoia, and abnormal movement.  Reports he works in his garden and  walks daily at the [facility] with friends. During visit he was dressed appropriate for age and weather.  He was seated comfortably in chair with no noted distress.  He was alert/oriented x 4, calm/cooperative and mood congruent with affect.  He spoke in a clear tone at moderate volume, and normal pace, with good eye contact.  His thought process was coherent, relevant, and there was no indication that he was responding to internal/external stimuli or experiencing delusional thought content.  1. Primary insomnia - traZODone  (DESYREL ) 100 MG tablet;  Take 1 tablet (100 mg total) by mouth daily.  Dispense: 90 tablet; Refill: 0  2. Mild recurrent major depression - busPIRone (BUSPAR) 5 MG tablet; Take 1 tablet (5 mg total) by mouth 2 (two) times daily.  Dispense: 60 tablet; Refill: 1 - traZODone  (DESYREL ) 100 MG tablet; Take 1 tablet (100 mg total) by mouth daily.  Dispense: 90 tablet; Refill: 0  3. GAD (generalized anxiety disorder) (Primary) - busPIRone (BUSPAR) 5 MG tablet; Take 1 tablet (5 mg total) by mouth 2 (two) times daily.  Dispense: 60 tablet; Refill: 1 - hydrOXYzine (ATARAX) 10 MG tablet; Take 1 tablet (10 mg total) by mouth 3 (three) times daily as needed for anxiety Lonnie).  Dispense: 60 tablet; Refill: 1  4. Insomnia, unspecified type - hydrOXYzine (ATARAX) 10 MG tablet; Take 1 tablet (10 mg total) by mouth 3 (three) times daily as needed for anxiety Lonnie).  Dispense: 60 tablet; Refill: 1 - traZODone  (DESYREL ) 100 MG tablet; Take 1 tablet (100 mg total) by mouth daily.  Dispense: 90 tablet; Refill: 0  5. On psychotropic medication - TSH - CBC with Differential - Comprehensive metabolic panel with GFR - Magnesium  - Prolactin - B12 - Lipid panel       Plan: Medication management: Meds ordered this encounter  Medications   busPIRone (BUSPAR) 5 MG tablet    Sig: Take 1 tablet (5 mg total) by mouth 2 (two) times daily.    Dispense:  60 tablet    Refill:  1    Supervising Provider:   ARFEEN, SYED T [[id number]]   hydrOXYzine (ATARAX) 10 MG tablet    Sig: Take 1 tablet (10 mg total) by mouth 3 (three) times daily as needed for anxiety Lonnie).    Dispense:  60 tablet    Refill:  1    Supervising Provider:   ARFEEN, SYED T [[id number]]   traZODone  (DESYREL ) 100 MG tablet    Sig: Take 1 tablet (100 mg total) by mouth daily.    Dispense:  90 tablet    Refill:  0    Supervising Provider:   ARFEEN, SYED T [[id number]]   Medications Discontinued During This Encounter  Medication Reason   traZODone  (DESYREL ) 100 MG tablet Reorder    Lab Orders         TSH         CBC with Differential         Comprehensive metabolic panel with GFR         Magnesium          Prolactin         B12         Lipid panel      Other:  Counseling/Therapy:  Declined.   Erik Odonnell was instructed to call 911, [phone], mobile crisis, or present to the nearest emergency room should he experiences any suicidal/homicidal ideation, auditory/visual/hallucinations, or detrimental worsening of his mental health condition.   Erik Odonnell  participated in the development of this treatment plan and verbalized his understanding/agreement with plan as listed.   Follow Up: Return in 1 month for medication management Call in the interim for any side-effects, decompensation, questions, or problems  Collaboration of Care: Medication Management AEB medication assessment, refills, started BuSpar and Vistaril and Referral or follow-up with counselor/therapist AEB declined referral to counseling/therapy  Patient/Guardian was advised Release of Information must be obtained prior to any record release in order to collaborate their care with an outside provider. Patient/Guardian was advised if they have not already done so to contact the registration department to sign all necessary forms in order for us  to release information regarding their care.   Consent: Patient/Guardian gives verbal consent for treatment and assignment of benefits for services provided during this visit. Patient/Guardian expressed understanding and agreed to proceed.   Khady Vandenberg, NP 10/20/202510:19 AM

## 2024-08-10 NOTE — Patient Instructions (Signed)
 Long-term use of benzodiazepines can lead to several adverse effects, including: Cognitive Impairment: Issues such as memory problems, decreased attention, and impaired cognitive abilities. Physical Health Risks: Increased risk of falls and fractures, especially in older adults.  Mental Health Issues: Mood swings, depression, and increased anxiety.  Dependence and Withdrawal: Physical dependence can develop, leading to withdrawal symptoms like irritability, sleep disturbances, and flu-like symptoms.  Social and Occupational Impact: Problems with employment and social interactions.  Gradual reduction under medical supervision is recommended to mitigate withdrawal symptoms.  Alternatives There are several alternatives to benzodiazepines for managing anxiety and related conditions. Some of the common options include: Selective Serotonin Reuptake Inhibitors (SSRIs): These medications, such as sertraline and fluoxetine, are often used as first-line treatments for anxiety disorders.  Serotonin-Norepinephrine Reuptake Inhibitors (SNRIs): Examples include venlafaxine and duloxetine, which are also effective for anxiety.  Buspirone: This is a non-benzodiazepine medication specifically for anxiety, with a lower risk of dependence.  Beta-Blockers: Medications like propranolol can help manage physical symptoms of anxiety, such as rapid heartbeat.  Pregabalin and Gabapentin : These are used for anxiety and have a different mechanism of action compared to benzodiazepines.  Hydroxyzine: An antihistamine that can be used for anxiety.   Call 911, [phone], mobile crisis, or present to the nearest emergency room should you experience any suicidal/homicidal ideation, auditory/visual/hallucinations, or detrimental worsening of your mental health.  Mobile Crisis Response Teams Listed by counties in vicinity of [facility] providers [facility] [employer] [phone] [facility] [phone] [facility] [phone] [facility] [phone] [place]                * [facility] [phone]                * [employer] [phone]  [facility] [phone] [facility] [employer]  [phone] * [employer] [phone]

## 2024-08-12 ENCOUNTER — Telehealth: Payer: Self-pay

## 2024-08-12 NOTE — Telephone Encounter (Signed)
 Returned the pt's call and advised that he will need appt before we could send in a Rx for his constipation. Pt was also advised that we did send him a MyChart message on 10/07 which it had been read. Pt was transferred to the front desk to leave a message to schedule appt if no one picked up

## 2024-08-24 ENCOUNTER — Encounter: Payer: Self-pay | Admitting: Urology

## 2024-08-24 ENCOUNTER — Ambulatory Visit: Payer: Medicare PPO | Admitting: Urology

## 2024-08-24 VITALS — BP 122/79 | HR 70

## 2024-08-24 DIAGNOSIS — Z87448 Personal history of other diseases of urinary system: Secondary | ICD-10-CM

## 2024-08-24 DIAGNOSIS — R310 Gross hematuria: Secondary | ICD-10-CM

## 2024-08-24 DIAGNOSIS — N400 Enlarged prostate without lower urinary tract symptoms: Secondary | ICD-10-CM

## 2024-08-24 NOTE — Progress Notes (Signed)
 08/24/2024 9:40 AM   Erik Odonnell 1946-01-26 [id number]  Referring provider: Bertell Satterfield, MD [address] [place],  [place] [address]  No chief complaint on file.   HPI:  1)  gross hematuria with clots in September 2024 - He also had hematospermia. His symptoms resolved. He underwent CT scan of the abdomen and pelvis August 20, 2023 which unofficially showed a 5.2 cm right lower pole cyst and was otherwise benign.  The prostate was obscured by bilateral hip replacement. Nov 2024 cysto - borderline obstructing prostate, no median lobe  2) BPH - on surveillance. No surgery. His PSA was 1.4 in April 2024 with a creatinine of 1.36.  Today, seen for the above.  CT report in 2024 was read as benign.  He has labs pending in epic with his PCP. Mar 2025 cr 1.34, gfr 54. No weak stream or gross hematuria.    PMH: Past Medical History:  Diagnosis Date   Arthritis    Cognitive impairment 05/08/2022   Degenerative disease of central nervous system 10/07/2023   Difficulty walking 04/07/2014   IMO SNOMED Dx Update Oct 2024     Diverticulosis    Essential (primary) hypertension 10/07/2023   Gastroesophageal reflux disease without esophagitis 10/07/2023   Gout    Hemifacial spasm    left side affected takes Artane  daily;    Hemifacial spasm of left side of face 05/18/2022   Hemorrhoids 09/11/2022   History of bronchitis 11/2013   Hypertension    takes Ziac  daily   Impairment of balance 04/07/2014   Joint pain    Memory loss    Primary osteoarthritis 10/07/2023   Primary osteoarthritis of hip 08/26/2018   Proximal leg weakness 04/07/2014   Type 2 diabetes mellitus with diabetic neuropathy (HCC) 10/07/2023    Surgical History: Past Surgical History:  Procedure Laterality Date   CATARACT EXTRACTION, BILATERAL Bilateral 2015, 2016   COLONOSCOPY     COLONOSCOPY N/A 12/18/2017   diverticulosis and internal hemorhoids.   COLONOSCOPY WITH PROPOFOL  N/A 08/07/2022    Procedure: COLONOSCOPY WITH PROPOFOL ;  Surgeon: Cindie Carlin POUR, DO;  Location: [facility];  Service: Endoscopy;  Laterality: N/A;  11:00 am   eye muschle surgeries  as a child   x 5   HERNIA REPAIR Left    as a child-inguinal;right inguinal in 1969   POLYPECTOMY  08/07/2022   Procedure: POLYPECTOMY;  Surgeon: Cindie Carlin POUR, DO;  Location: [facility];  Service: Endoscopy;;   TOTAL HIP ARTHROPLASTY Right 03/24/2014   Procedure: TOTAL HIP ARTHROPLASTY ANTERIOR APPROACH;  Surgeon: Toribio JULIANNA Chancy, MD;  Location: [facility] OR;  Service: Orthopedics;  Laterality: Right;   TOTAL HIP ARTHROPLASTY Left 08/26/2018   Procedure: LEFT TOTAL HIP ARTHROPLASTY ANTERIOR APPROACH;  Surgeon: Chancy Evalene BIRCH, MD;  Location: [facility];  Service: Orthopedics;  Laterality: Left;    Home Medications:  Allergies as of 08/24/2024   No Known Allergies      Medication List        Accurate as of August 24, 2024  9:40 AM. If you have any questions, ask your nurse or doctor.          allopurinol  300 MG tablet Commonly known as: ZYLOPRIM  Take 300 mg by mouth daily.   ALPRAZolam  0.5 MG tablet Commonly known as: XANAX  Take 0.25-0.5 mg by mouth at bedtime as needed for sleep.   busPIRone 5 MG tablet Commonly known as: BUSPAR Take 1 tablet (5 mg total) by  mouth 2 (two) times daily.   cetirizine HCl 1 MG/ML solution Commonly known as: ZYRTEC Take 10 mg by mouth daily.   hydrocortisone  2.5 % rectal cream Commonly known as: ANUSOL -HC APPLY RECTALLY TO THE AFFECTED AREA TWICE DAILY AS NEEDED FOR ANORECTAL BLEEDING   hydrOXYzine 10 MG tablet Commonly known as: ATARAX Take 1 tablet (10 mg total) by mouth 3 (three) times daily as needed for anxiety Lonnie).   ketoconazole 2 % cream Commonly known as: NIZORAL Apply 1 application topically daily as needed (foot rash).   levothyroxine  50 MCG tablet Commonly known as: SYNTHROID  Take 50 mcg by mouth daily before breakfast.    lisinopril -hydrochlorothiazide  20-12.5 MG tablet Commonly known as: ZESTORETIC  Take 1 tablet by mouth daily.   polyethylene glycol powder 17 GM/SCOOP powder Commonly known as: GLYCOLAX /MIRALAX  Take by mouth daily.   traZODone  100 MG tablet Commonly known as: DESYREL  Take 1 tablet (100 mg total) by mouth daily.        Allergies: No Known Allergies  Family History: Family History  Problem Relation Age of Onset   Aneurysm Mother        Brain   Dementia Mother    Heart attack Father    Colon cancer Neg Hx    Colon polyps Neg Hx     Social History:  reports that he has never smoked. He has never used smokeless tobacco. He reports current alcohol use of about 7.0 standard drinks of alcohol per week. He reports that he does not use drugs.   Physical Exam: BP 122/79   Pulse 70   Constitutional:  Alert and oriented, No acute distress. HEENT: [place] AT, moist mucus membranes.  Trachea midline, no masses. Cardiovascular: No clubbing, cyanosis, or edema. Respiratory: Normal respiratory effort, no increased work of breathing. GI: Abdomen is soft, nontender, nondistended, no abdominal masses GU: No CVA tenderness Skin: No rashes, bruises or suspicious lesions. Neurologic: Grossly intact, no focal deficits, moving all 4 extremities. Psychiatric: Normal mood and affect.  Laboratory Data: Lab Results  Component Value Date   WBC 6.9 08/18/2018   HGB 16.3 08/18/2018   HCT 48.8 08/18/2018   MCV 99.0 08/18/2018   PLT 161 08/18/2018    Lab Results  Component Value Date   CREATININE 1.40 (H) 10/04/2023    No results found for: PSA  No results found for: TESTOSTERONE  No results found for: HGBA1C  Urinalysis    Component Value Date/Time   COLORURINE YELLOW 03/17/2014 1222   APPEARANCEUR Clear 08/26/2023 1351   LABSPEC 1.016 03/17/2014 1222   PHURINE 6.5 03/17/2014 1222   GLUCOSEU Negative 08/26/2023 1351   HGBUR NEGATIVE 03/17/2014 1222   BILIRUBINUR Negative  08/26/2023 1351   KETONESUR NEGATIVE 03/17/2014 1222   PROTEINUR Negative 08/26/2023 1351   PROTEINUR NEGATIVE 03/17/2014 1222   UROBILINOGEN 0.2 03/17/2014 1222   NITRITE Negative 08/26/2023 1351   NITRITE NEGATIVE 03/17/2014 1222   LEUKOCYTESUR Negative 08/26/2023 1351    Lab Results  Component Value Date   LABMICR See below: 08/26/2023   WBCUA None seen 08/26/2023   LABEPIT 0-10 08/26/2023   BACTERIA None seen 08/26/2023    Pertinent Imaging: Ct report 2024   Assessment & Plan:    BPH - doing well. No recurrence of gross hematuria. Discussed CKD, no  hydro on CT last year.   No follow-ups on file.  Donnice Brooks, MD  [facility]  [address] [place], [place] [address] [phone]

## 2024-09-01 ENCOUNTER — Encounter

## 2024-09-03 ENCOUNTER — Ambulatory Visit: Admitting: Pulmonary Disease

## 2024-09-03 ENCOUNTER — Encounter: Payer: Self-pay | Admitting: Pulmonary Disease

## 2024-09-03 VITALS — BP 113/75 | HR 68 | Temp 97.3°F | Ht 68.0 in | Wt 209.0 lb

## 2024-09-03 DIAGNOSIS — Z77090 Contact with and (suspected) exposure to asbestos: Secondary | ICD-10-CM | POA: Diagnosis not present

## 2024-09-03 DIAGNOSIS — J849 Interstitial pulmonary disease, unspecified: Secondary | ICD-10-CM

## 2024-09-03 DIAGNOSIS — J310 Chronic rhinitis: Secondary | ICD-10-CM | POA: Diagnosis not present

## 2024-09-03 DIAGNOSIS — R053 Chronic cough: Secondary | ICD-10-CM | POA: Diagnosis not present

## 2024-09-03 NOTE — Progress Notes (Signed)
 Erik Odonnell    [id number]    1945/12/06  Primary Care Physician:Huenink, Leita, FNP  Referring Physician: Bevely Leita, FNP [address] [place],  [place] [address]  Chief complaint: Follow-up for abnormal CT, concern for interstitial lung disease  HPI: 78 y.o. who  has a past medical history of Arthritis, Cognitive impairment (05/08/2022), Degenerative disease of central nervous system (10/07/2023), Difficulty walking (04/07/2014), Diverticulosis, Essential (primary) hypertension (10/07/2023), Gastroesophageal reflux disease without esophagitis (10/07/2023), Gout, Hemifacial spasm, Hemifacial spasm of left side of face (05/18/2022), Hemorrhoids (09/11/2022), History of bronchitis (11/2013), Hypertension, Impairment of balance (04/07/2014), Joint pain, Memory loss, Primary osteoarthritis (10/07/2023), Primary osteoarthritis of hip (08/26/2018), Proximal leg weakness (04/07/2014), and Type 2 diabetes mellitus with diabetic neuropathy (HCC) (10/07/2023).   Discussed the use of AI scribe software for clinical note transcription with the patient, who gave verbal consent to proceed.  History of Present Illness Erik Odonnell is a 78 year old male who presents for evaluation following a CT scan showing lung abnormalities.  A CT scan of the abdomen and pelvis was initially performed due to hematuria, which led to a subsequent chest CT scan revealing a small abnormality at the lung base. He currently has no symptoms related to this finding.  He has a history of recurrent bronchitis, particularly following colds, occurring approximately once a year, with the last episode in October 2023 after a [place]. He experiences no shortness of breath between episodes.  He has a history of a persistent cough that can lead to syncope, with the most significant episode occurring about 12-13 years ago, lasting from March to May. He occasionally experiences choking  and difficulty swallowing large pills. He takes Zyrtec nightly for what is believed to be drainage or allergies.  He has never smoked but was exposed to secondhand smoke from his parents, who were chain smokers. He also has a history of exposure to [employer] and asbestos during his time in the [employer] from 1968 to 1972. 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 He has no pets currently and no recent travel except for a trip to [place] in 2023.  Interval history: Erik Odonnell is an 78 year old male with mild interstitial lung disease who presents with a persistent cough.  Cough - Persistent cough since February 2025 - Initially dry, later became looser - Minimal relief with Zyrtec and Mucinex DM - Menthol  cough drops provide some symptomatic relief but cause a burning sensation - History of severe cough 13-14 years ago, intense enough to cause syncope - Tussionex was effective for prior severe cough, but currently difficult to obtain - Chlorpheniramine maleate significantly improved prior severe cough; keeps this medication on hand for severe episodes  Pulmonary imaging and function - CT scan in 2024 showed mild interstitial lung disease - No change on follow-up CT scan in September 2025 - Lung function tests earlier in 2025 showed slight reduction but remained within normal range  Relevant pulmonary history Pets: Used to have a dog Occupation: Retired history runner, broadcasting/film/video and peach farmer Exposures: No mold, hot tub, Jacuzzi.  No feather pillows or comforter.  Was exposed to agent orange and asbestos while in the [employer] in the 1960s Smoking history: Never smoker Travel history: No significant travel history Relevant family history: No family history of lung disease  Outpatient Encounter Medications as of 09/03/2024  Medication Sig   allopurinol  (  ZYLOPRIM ) 300 MG tablet Take 300 mg by mouth daily.   ALPRAZolam  (XANAX ) 0.5 MG tablet Take 0.25-0.5 mg by mouth  at bedtime as needed for sleep.   cetirizine HCl (ZYRTEC) 1 MG/ML solution Take 10 mg by mouth daily. (Patient taking differently: Take 10 mg by mouth daily. prn)   hydrocortisone  (ANUSOL -HC) 2.5 % rectal cream APPLY RECTALLY TO THE AFFECTED AREA TWICE DAILY AS NEEDED FOR ANORECTAL BLEEDING   ketoconazole (NIZORAL) 2 % cream Apply 1 application topically daily as needed (foot rash).   levothyroxine  (SYNTHROID ) 50 MCG tablet Take 50 mcg by mouth daily before breakfast.   lisinopril -hydrochlorothiazide  (ZESTORETIC ) 20-12.5 MG tablet Take 1 tablet by mouth daily.   polyethylene glycol powder (GLYCOLAX /MIRALAX ) 17 GM/SCOOP powder Take by mouth daily.   traZODone  (DESYREL ) 100 MG tablet Take 1 tablet (100 mg total) by mouth daily.   busPIRone (BUSPAR) 5 MG tablet Take 1 tablet (5 mg total) by mouth 2 (two) times daily.   hydrOXYzine (ATARAX) 10 MG tablet Take 1 tablet (10 mg total) by mouth 3 (three) times daily as needed for anxiety Lonnie).   Facility-Administered Encounter Medications as of 09/03/2024  Medication   botulinum toxin Type A  (BOTOX ) injection 100 Units    Vitals:   09/03/24 1134  BP: 113/75  Pulse: 68  Temp: (!) 97.3 F (36.3 C)  Height: 5' 8 (1.727 m)  Weight: 209 lb (94.8 kg)  SpO2: 93%  TempSrc: Oral  BMI (Calculated): 31.79     Physical Exam GEN: No acute distress. CV: Regular rate and rhythm, no murmurs. LUNGS: Clear to auscultation bilaterally, normal respiratory effort. SKIN JOINTS: Warm and dry, no rash.    Data Reviewed: Imaging: CT abdomen pelvis 08/20/2023-visualized lungs show mild scattered subpleural reticular densities CT chest 10/04/2023-mild subpleural reticulation, linear fibrosis in the left lower lobe I had reviewed images personally.  PFTs:  03/05/2024 FVC 2.82 [95%], FEV1 2.33 [87%], F/F83, TLC 5.17 [77%], DLCO 17.80 [78%] Normal test  Labs: CTD serology 01/15/2024-ANA 1: 80, nuclear homogeneous  Assessment & Plan Mild interstitial  lung disease due to asbestos exposure Mild interstitial lung disease likely secondary to asbestos exposure, confirmed by CT scan in September 2024 and unchanged in September 2025. Lung function tests show slight reduction but remain within normal range. No significant impact on lung function currently. Differential diagnosis includes autoimmune disease, but ANA levels are borderline and not significant. Likely related to past exposure to asbestos and possibly Agent Orange. - Continue annual monitoring with CT scan and pulmonary function test. - Will schedule CT scan and pulmonary function test for September 2026. - Will complete [facility] claim form [facility] exposure.  Chronic cough likely due to postnasal drip and chronic rhinitis Chronic cough since February, initially dry, now looser. Likely due to postnasal drip and chronic rhinitis rather than lung pathology. Previous treatments with Zyrtec and Mucinex were ineffective. Menthol  lozenges provide some relief. Chlorpheniramine malate has been effective in the past for severe cough episodes. - Recommended trial of Zyrtec or chlorpheniramine for cough management. - Continue use of menthol  lozenges as needed for symptomatic relief.  Recommendations: Follow-up CT, PFTs  I personally spent a total of 30 minutes in the care of the patient today including preparing to see the patient, getting/reviewing separately obtained history, performing a medically appropriate exam/evaluation, counseling and educating, independently interpreting results, communicating results, and coordinating care .  Lonna Coder MD [facility] 09/03/2024, 11:46 AM  CC: Bevely Doffing, FNP

## 2024-09-03 NOTE — Patient Instructions (Signed)
  VISIT SUMMARY: You visited us  today due to a persistent cough that has been ongoing since February 2025. We discussed your mild interstitial lung disease, likely due to asbestos exposure, and your chronic cough, which is probably caused by postnasal drip and chronic rhinitis.  YOUR PLAN: MILD INTERSTITIAL LUNG DISEASE: Your mild interstitial lung disease is likely due to past asbestos exposure. Recent CT scans and lung function tests show no significant changes or impact on your lung function. -Continue with annual monitoring through CT scans and pulmonary function tests. -We will schedule your next CT scan and pulmonary function test for September 2026. -We will complete the [facility] claim form for your asbestos exposure.  CHRONIC COUGH: Your chronic cough, which started in February 2025, is likely due to postnasal drip and chronic rhinitis rather than lung issues. -Try using Zyrtec or chlorpheniramine to manage your cough. -Continue using menthol  lozenges as needed for relief.

## 2024-09-09 ENCOUNTER — Ambulatory Visit (INDEPENDENT_AMBULATORY_CARE_PROVIDER_SITE_OTHER): Admitting: Psychiatry

## 2024-09-09 ENCOUNTER — Encounter: Payer: Self-pay | Admitting: Gastroenterology

## 2024-09-09 ENCOUNTER — Ambulatory Visit: Admitting: Gastroenterology

## 2024-09-09 VITALS — BP 129/82 | HR 69 | Temp 97.7°F | Ht 68.0 in | Wt 209.2 lb

## 2024-09-09 DIAGNOSIS — K5900 Constipation, unspecified: Secondary | ICD-10-CM | POA: Diagnosis not present

## 2024-09-09 DIAGNOSIS — K649 Unspecified hemorrhoids: Secondary | ICD-10-CM

## 2024-09-09 DIAGNOSIS — F411 Generalized anxiety disorder: Secondary | ICD-10-CM | POA: Diagnosis not present

## 2024-09-09 MED ORDER — HYDROCORTISONE (PERIANAL) 2.5 % EX CREA: 1.0000 | TOPICAL_CREAM | Freq: Two times a day (BID) | CUTANEOUS | 0 refills | Status: AC

## 2024-09-09 NOTE — Patient Instructions (Signed)
 You can add benefiber one tablespoon daily to help bowel function. If you notice increased gas, try cutting back to 1-2 teaspoons daily.  Continue Miralax  one capful daily mixed in 4-6 ounces of liquid.  If you need extra help for your constipation, you can take up to two capfuls of miralax  in 12 ounces of liquid daily or one capful twice daily, until your stools are moving well again, then decrease back to once daily.  If you find you have not had bowel movement for multiple days and really need good movement of your stool, you can consider a mini-bowel prep like for a colonoscopy. You can take one capful of miralax  every hour for 5 hours. This should not be done regularly, only occasionally. Please let me know if you have to complete a mini-bowel prep more than once as this should be evaluated by a provider if medication is not working properly.  I have sent in another prescription for hemorrhoid cream. Let me know if any issues.  Return to the office in one year or sooner if needed. If doing well at one year, you can push out your follow up another six months.

## 2024-09-09 NOTE — Progress Notes (Signed)
 Comprehensive Clinical Assessment (CCA) Note  09/09/2024 Erik Odonnell [id number]  Chief Complaint: Irritability, anxiety Visit Diagnosis: Generalized anxiety disorder     CCA Biopsychosocial Intake/Chief Complaint:   I lose my ability to keep thoughts to myself, will cry when talking to someone,sometimes has thoughts about being going a call where a young child died 25 years ago, was a it sales professional for 36 years, memories of calls and things that happened  Current Symptoms/Problems: No data recorded  Patient Reported Schizophrenia/Schizoaffective Diagnosis in Past: No   Strengths: No data recorded Preferences: No data recorded Abilities: No data recorded  Type of Services Patient Feels are Needed: Individual therapy-   Initial Clinical Notes/Concerns: Pt is referred for services, Pt denies any psychatric hospitalizations and denies any previous involvement in counseling or therapy.   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)   Obsessions:  None   Compulsions:  None   Inattention:  None   Hyperactivity/Impulsivity:  None   Oppositional/Defiant Behaviors:  None   Emotional Irregularity:  None   Other Mood/Personality Symptoms:  No data recorded   Mental Status Exam Appearance and self-care  Stature:  Average   Weight:  Overweight   Clothing:  Casual   Grooming:  Normal   Cosmetic use:  None   Posture/gait:  Normal   Motor activity:  Not Remarkable   Sensorium  Attention:  No data recorded  Concentration:  Scattered   Orientation:  -- (remember month)   Recall/memory:  Defective in Immediate   Affect and Mood  Affect:  Appropriate   Mood:   Euthymic   Relating  Eye contact:  Normal   Facial expression:  Responsive   Attitude toward examiner:  Cooperative   Thought and Language  Speech flow: Normal   Thought content:  Appropriate to Mood and Circumstances   Preoccupation:  None   Hallucinations:  None   Organization:  No data recorded  [employer] of Knowledge:  Average   Intelligence:  Average   Abstraction:  Normal   Judgement:  Good   Reality Testing:  Realistic   Insight:  Good   Decision Making:  Normal   Social Functioning  Social Maturity:  No data recorded  Social Judgement:  Normal   Stress  Stressors:  No data recorded  Coping Ability:  Normal   Skill Deficits:  No data recorded  Supports:  Family; Friends/Service system     Religion: Religion/Spirituality Are You A Religious Person?: Yes What is Your Religious Affiliation?: Chiropodist: Leisure / Recreation Do You Have Hobbies?: Yes Leisure and Hobbies: cut firewood, go to [employer], take grandkids fishing  Exercise/Diet: Exercise/Diet Do You Exercise?: Yes What Type of Exercise Do You Do?: Run/Walk (walks a mile daily) How Many Times a Week Do You Exercise?: 6-7 times a week Have You Gained or Lost A Significant Amount of Weight in the Past Six Months?: No Do You Follow a Special Diet?: No Do You Have Any Trouble Sleeping?: No   CCA Employment/Education will assess next session Employment/Work Situation:   Education: Will gather more information next session   CCA Family/Childhood History /will assess more next session Family and Relationship History:   Childhood History:  Child/Adolescent Assessment:     CCA Substance Use Alcohol/Drug Use:   ASAM's:  Six Dimensions of Multidimensional Assessment  Dimension 1:  Acute Intoxication and/or Withdrawal Potential:      Dimension 2:  Biomedical Conditions and Complications:      Dimension 3:  Emotional, Behavioral, or  Cognitive Conditions and Complications:    Dimension 4:  Readiness to Change:    Dimension 5:  Relapse, Continued use, or Continued Problem Potential:     Dimension 6:  Recovery/Living Environment:    ASAM Severity Score:    ASAM Recommended Level of Treatment:     Substance use Disorder (SUD)   Recommendations for Services/Supports/Treatments: Individual therapy/patient attends assessment appointment today.  Confidentiality and limits are discussed.  Nutritional assessment, pain assessment, PHQ 2 and 9, C-S SRS, GAD-7 administered.  Ongoing assessment and individual therapy is recommended to help improve coping skills to manage life transitions as well as cope with memories/feelings related to the past.    DSM5 Diagnoses: Patient Active Problem List   Diagnosis Date Noted   Constipation 09/09/2024   Primary insomnia 06/10/2024   IFG (impaired fasting glucose) 06/09/2024   Hypothyroidism 06/09/2024   Gout of ankle 06/09/2024   Vitamin D deficiency 06/09/2024   Pulmonary hypertension, unspecified (HCC) 11/24/2023   Coronary artery calcification 11/24/2023   Dizziness 11/24/2023   Aortic dilatation 11/24/2023   Degenerative disease of central nervous system 10/07/2023   Essential (primary) hypertension 10/07/2023   Primary osteoarthritis 10/07/2023   Hemorrhoids 09/11/2022   Hemifacial spasm of left side of face 05/18/2022   Cognitive impairment 05/08/2022   Primary osteoarthritis of hip 08/26/2018   Proximal leg weakness 04/07/2014   Difficulty walking 04/07/2014   Impairment of balance 04/07/2014   Primary localized osteoarthrosis, pelvic region and thigh 03/24/2014    Patient Centered Plan: Patient is on the following Treatment Plan(s): Will be developed next session   Referrals to Alternative Service(s): Referred to Alternative Service(s):   Place:   Date:   Time:    Referred to Alternative Service(s):   Place:   Date:   Time:    Referred to Alternative Service(s):    Place:   Date:   Time:    Referred to Alternative Service(s):   Place:   Date:   Time:      Collaboration of Care: Medication Management AEB patient sees NP Shuvon rankings for medication management  Patient/Guardian was advised Release of Information must be obtained prior to any record release in order to collaborate their care with an outside provider. Patient/Guardian was advised if they have not already done so to contact the registration department to sign all necessary forms in order for us  to release information regarding their care.   Consent: Patient/Guardian gives verbal consent for treatment and assignment of benefits for services provided during this visit. Patient/Guardian expressed understanding and agreed to proceed.   Etai Copado E Janah Mcculloh, LCSW

## 2024-09-09 NOTE — Progress Notes (Signed)
 GI Office Note    Referring Provider: Bevely Doffing, FNP Primary Care Physician:  Bevely Doffing, FNP  Primary Gastroenterologist: Carlin POUR. Cindie, DO   Chief Complaint   Chief Complaint  Patient presents with   Constipation    Was having issues with constipation but it has since gotten better since taking miralax  daily and twice daily as needed.     History of Present Illness   Erik Odonnell is a 78 y.o. male presenting today for constipation.  Patient was last seen November 2023.  Discussed the use of AI scribe software for clinical note transcription with the patient, who gave verbal consent to proceed.  History of Present Illness Erik Odonnell is an 78 year old male who presents for re-establishment of care and management of constipation.  He experiences intermittent constipation, with a recent episode prompting a call to the office. He has been using MiraLax  once daily since a previous episode of constipation several years ago, which was associated with rectal bleeding. At that time, a colonoscopy showed no significant findings. Several weeks ago he noted miralax  once daily was not helping his BMs and he was having constipation. He increased his miralax  to twice daily and symptoms improved and now having BM every morning. He typically takes his miralax  before bed. He denies melena, brbpr, no rectal pain, no heartburn.    He mentions a past prescription for hydrocortisone  cream for hemorrhoidal symptoms, which he has not used recently but wishes to have on hand for potential irritation, itching, or burning.          Prior Data    Colonoscopy October 2023: -Hemorrhoids found on perianal exam - Nonbleeding internal hemorrhoids - Diverticulosis in the sigmoid colon, descending colon, transverse colon -two 3 to 5 mm polyps in the sigmoid colon and transverse colon removed, tubular adenoma, hyperplastic polyp - Redundant colon -No future colonoscopy due to age  unless change in symptoms   Medications   Current Outpatient Medications  Medication Sig Dispense Refill   allopurinol  (ZYLOPRIM ) 300 MG tablet Take 300 mg by mouth daily.     ALPRAZolam  (XANAX ) 0.5 MG tablet Take 0.25-0.5 mg by mouth at bedtime as needed for sleep.     cetirizine HCl (ZYRTEC) 1 MG/ML solution Take 10 mg by mouth daily. (Patient taking differently: Take 10 mg by mouth daily. prn)     hydrocortisone  (ANUSOL -HC) 2.5 % rectal cream APPLY RECTALLY TO THE AFFECTED AREA TWICE DAILY AS NEEDED FOR ANORECTAL BLEEDING 30 g 1   ketoconazole (NIZORAL) 2 % cream Apply 1 application topically daily as needed (foot rash).     levothyroxine  (SYNTHROID ) 50 MCG tablet Take 50 mcg by mouth daily before breakfast.  10   lisinopril -hydrochlorothiazide  (ZESTORETIC ) 20-12.5 MG tablet Take 1 tablet by mouth daily. 90 tablet 2   polyethylene glycol powder (GLYCOLAX /MIRALAX ) 17 GM/SCOOP powder Take by mouth daily.     traZODone  (DESYREL ) 100 MG tablet Take 1 tablet (100 mg total) by mouth daily. 90 tablet 0   Current Facility-Administered Medications  Medication Dose Route Frequency Provider Last Rate Last Admin   botulinum toxin Type A  (BOTOX ) injection 100 Units  100 Units Intramuscular Once Onita Duos, MD        Allergies   Allergies as of 09/09/2024   (No Known Allergies)       Review of Systems   General: Negative for anorexia, weight loss, fever, chills, fatigue, weakness. ENT: Negative for hoarseness, difficulty swallowing , nasal  congestion. CV: Negative for chest pain, angina, palpitations, dyspnea on exertion, peripheral edema.  Respiratory: Negative for dyspnea at rest, dyspnea on exertion, cough, sputum, wheezing.  GI: See history of present illness. GU:  Negative for dysuria, hematuria, urinary incontinence, urinary frequency, nocturnal urination.  Endo: Negative for unusual weight change.     Physical Exam   BP 129/82   Pulse 69   Temp 97.7 F (36.5 C) (Oral)   Ht 5'  8 (1.727 m)   Wt 209 lb 3.2 oz (94.9 kg)   SpO2 94%   BMI 31.81 kg/m    General: Well-nourished, well-developed in no acute distress. Presents with wife. Patient has some cognitive issues at baseline. Able to provide history. Eyes: No icterus. Mouth: Oropharyngeal mucosa moist and pink   Lungs: Clear to auscultation bilaterally.  Heart: Regular rate and rhythm, no murmurs rubs or gallops.  Abdomen: Bowel sounds are normal, nontender, nondistended, no hepatosplenomegaly or masses,  no abdominal bruits or hernia , no rebound or guarding.  Rectal: not performed Extremities: No lower extremity edema. No clubbing or deformities. Neuro: Alert and oriented x 4   Skin: Warm and dry, no jaundice.   Psych: Alert and cooperative, normal mood and affect.  Labs   None available  Imaging Studies   No results found.  Assessment/Plan:    Assessment & Plan Chronic constipation Recent exacerbation managed with increased Miralax . No current issues. - Continue Miralax  once daily, increase to twice daily as needed. - Provided written instructions for Miralax  use, how to increase to twice daily as well as how to take mini bowel purge with Miralax  on rare occasion if needed.  - Add Benefiber 1 tablespoon daily.  Hemorrhoids with history of irritation No recent symptoms. Desires having RX on hand.  - Anusol  2.5% cream, use anorectally twice daily for up to 10 days as needed. RX sent.  - Advised to contact if prescription issues persist.    Return to office in one year or call sooner if needed. If he is doing well at one year, he can postpone visit additional six months. Ok to provide simple advice by phone or [employer] as appropriate.         [name]. Ezzard, MHS, PA-C [facility]

## 2024-09-14 ENCOUNTER — Ambulatory Visit (HOSPITAL_COMMUNITY): Admitting: Registered Nurse

## 2024-09-23 ENCOUNTER — Other Ambulatory Visit: Payer: Self-pay

## 2024-09-25 ENCOUNTER — Other Ambulatory Visit: Payer: Self-pay

## 2024-10-23 ENCOUNTER — Ambulatory Visit: Payer: Self-pay | Admitting: *Deleted

## 2024-10-23 NOTE — Telephone Encounter (Signed)
 FYI Only or Action Required?: Action required by provider: clinical question for provider and update on patient condition.  Patient was last seen in primary care on 06/09/2024 by Bevely Doffing, FNP.  Called Nurse Triage reporting Memory Loss.  Symptoms began about a month ago.  Interventions attempted: Prescription medications: trazodone  for sleep  and Rest, hydration, or home remedies.  Symptoms are: gradually worsening.  Triage Disposition: See Physician Within 24 Hours  Patient/caregiver understands and will follow disposition?: No, wishes to speak with PCP    Please advise if able to be seen by PCP. See NT encounter              Copied from CRM [id number]. Topic: Clinical - Red Word Triage >> Oct 23, 2024 11:00 AM Avram MATSU wrote: Red Word that prompted transfer to Nurse Triage: memory and confusion recently. Behavioral changes. Reason for Disposition  [1] Confusion getting worse AND [2] slow onset (days to weeks)  Answer Assessment - Initial Assessment Questions No available appt today. Recommended UC/ED for worsening confusion. Patient wife reports she wanted to let PCP or other provider know changes in behavior and worsening confusion prior to already scheduled OV for 10/28/24. Wife does not feel she can speak freely to provider in front of patient due to agitation increases against wife at times. Patient requests more cognitive cues to find bathroom or complete tasks than before. Please advise.       1. MAIN CONCERN OR SYMPTOM:  What is your main concern right now? What questions do you have? What's the main symptom you're worried about? (e.g., confusion, memory loss)     Worsening confusion, memory loss. 2. ONSET:  When did the symptom start (or worsen)? (minutes, hours, days, weeks)     Since October but worsening since prednisone given for sciatica pain and completed on 10/14/24. Reports worsening x 1 month 3. BETTER-SAME-WORSE: Are you (the patient)  getting better, staying the same, or getting worse compared to the day you (they) were diagnosed or most recent hospital discharge?     Worse  4. DIAGNOSIS: Was the dementia diagnosed by a doctor? If Yes, ask: When? (e.g., days, months, years ago)     Yes  5. MEDICINES: Has there been any change in medicines recently? (e.g., narcotics, antihistamines, benzodiazepines, etc.)     prednisone 6. OTHER SYMPTOMS: Are there any other symptoms? (e.g., cough, falling, fever, pain)     Confusion worsening , complaining with hemorrhoids, swelling bilateral feet and c/o bottom of feet pain. Agitated easily, unable to use cell phone or adjust thermostat as previously done. Anger with wife at times. Needs direction of where bathroom is at times, tells wife she is wrong when she is right. Denies chest pain no difficulty breathing no fever no physical agitation. Having bad days and nights. Unsure if patient is drinking or eating enough. 7. SUPPORT: What type of support do you (the patient) have? Note: Document living circumstances and support (e.g., family, nursing home).     Wife caring for patient  Protocols used: Dementia Symptoms and Questions-A-AH

## 2024-10-24 ENCOUNTER — Other Ambulatory Visit: Payer: Self-pay

## 2024-10-24 ENCOUNTER — Emergency Department (HOSPITAL_COMMUNITY)
Admission: EM | Admit: 2024-10-24 | Discharge: 2024-10-24 | Disposition: A | Discharge status: Home / Self Care | Attending: Emergency Medicine | Admitting: Emergency Medicine

## 2024-10-24 ENCOUNTER — Encounter (HOSPITAL_COMMUNITY): Payer: Self-pay

## 2024-10-24 DIAGNOSIS — R451 Restlessness and agitation: Secondary | ICD-10-CM | POA: Diagnosis present

## 2024-10-24 DIAGNOSIS — M549 Dorsalgia, unspecified: Secondary | ICD-10-CM | POA: Insufficient documentation

## 2024-10-24 LAB — CBC WITH DIFFERENTIAL/PLATELET
Abs Immature Granulocytes: 0.08 K/uL — ABNORMAL HIGH (ref 0.00–0.07)
Basophils Absolute: 0.1 K/uL (ref 0.0–0.1)
Basophils Relative: 1 %
Eosinophils Absolute: 0.1 K/uL (ref 0.0–0.5)
Eosinophils Relative: 2 %
HCT: 44.7 % (ref 39.0–52.0)
Hemoglobin: 15.7 g/dL (ref 13.0–17.0)
Immature Granulocytes: 1 %
Lymphocytes Relative: 13 %
Lymphs Abs: 0.9 K/uL (ref 0.7–4.0)
MCH: 34.7 pg — ABNORMAL HIGH (ref 26.0–34.0)
MCHC: 35.1 g/dL (ref 30.0–36.0)
MCV: 98.7 fL (ref 80.0–100.0)
Monocytes Absolute: 0.7 K/uL (ref 0.1–1.0)
Monocytes Relative: 10 %
Neutro Abs: 4.9 K/uL (ref 1.7–7.7)
Neutrophils Relative %: 73 %
Platelets: 174 K/uL (ref 150–400)
RBC: 4.53 MIL/uL (ref 4.22–5.81)
RDW: 13.2 % (ref 11.5–15.5)
WBC: 6.7 K/uL (ref 4.0–10.5)
nRBC: 0 % (ref 0.0–0.2)

## 2024-10-24 LAB — URINALYSIS, ROUTINE W REFLEX MICROSCOPIC
Bilirubin Urine: NEGATIVE
Glucose, UA: NEGATIVE mg/dL
Hgb urine dipstick: NEGATIVE
Ketones, ur: NEGATIVE mg/dL
Leukocytes,Ua: NEGATIVE
Nitrite: NEGATIVE
Protein, ur: NEGATIVE mg/dL
Specific Gravity, Urine: 1.011 (ref 1.005–1.030)
pH: 7 (ref 5.0–8.0)

## 2024-10-24 LAB — CBG MONITORING, ED: Glucose-Capillary: 123 mg/dL — ABNORMAL HIGH (ref 70–99)

## 2024-10-24 LAB — COMPREHENSIVE METABOLIC PANEL WITH GFR
ALT: 25 U/L (ref 0–44)
AST: 23 U/L (ref 15–41)
Albumin: 4.2 g/dL (ref 3.5–5.0)
Alkaline Phosphatase: 53 U/L (ref 38–126)
Anion gap: 15 (ref 5–15)
BUN: 23 mg/dL (ref 8–23)
CO2: 21 mmol/L — ABNORMAL LOW (ref 22–32)
Calcium: 9 mg/dL (ref 8.9–10.3)
Chloride: 96 mmol/L — ABNORMAL LOW (ref 98–111)
Creatinine, Ser: 1.51 mg/dL — ABNORMAL HIGH (ref 0.61–1.24)
GFR, Estimated: 46 mL/min — ABNORMAL LOW
Glucose, Bld: 107 mg/dL — ABNORMAL HIGH (ref 70–99)
Potassium: 4.3 mmol/L (ref 3.5–5.1)
Sodium: 132 mmol/L — ABNORMAL LOW (ref 135–145)
Total Bilirubin: 0.8 mg/dL (ref 0.0–1.2)
Total Protein: 6.7 g/dL (ref 6.5–8.1)

## 2024-10-24 LAB — MAGNESIUM: Magnesium: 2.4 mg/dL (ref 1.7–2.4)

## 2024-10-24 MED ORDER — QUETIAPINE FUMARATE 25 MG PO TABS: 50.0000 mg | ORAL_TABLET | Freq: Every day | ORAL | Status: DC

## 2024-10-24 MED ORDER — QUETIAPINE FUMARATE 50 MG PO TABS: 50.0000 mg | ORAL_TABLET | Freq: Two times a day (BID) | ORAL | 0 refills | Status: DC

## 2024-10-24 MED ORDER — QUETIAPINE FUMARATE 25 MG PO TABS
50.0000 mg | ORAL_TABLET | Freq: Every day | ORAL | Status: DC
  Administered 2024-10-24: 50 mg via ORAL
  Filled 2024-10-24: qty 2

## 2024-10-24 NOTE — ED Triage Notes (Addendum)
 Pt from home bib wife for dementia. Per wife pt has been less cooperative and angry with his spouse prompting her to call 911 this morning. Hx of hemorrhoids.  Pt oriented to self, place, situation and ambulatory. Pt is calm at time of triage. hx of lung disease.

## 2024-10-24 NOTE — ED Provider Notes (Signed)
 " [place] EMERGENCY DEPARTMENT AT [facility] Provider Note   CSN: [id number] Arrival date & time: 10/24/24  1120     Patient presents with: Dementia   Erik Odonnell is a 79 y.o. male.   HPI   This patient is an 79 year old male presenting in the care of his spouse and daughter after he has had some progressive agitation and behavior that has been spitting out of control after starting prednisone.  He started to have a little bit of worsening before the prednisone but then had some back pain and was given a prednisone pack, he has had some increasing agitation especially in the mornings, this morning he made some comments to his spouse that made her concerned, he has calm down at this point.  He has been taking trazodone  100 mg at night, this helps with his sleep but he said increasing agitation during the days.  No other organic symptoms  Prior to Admission medications  Medication Sig Start Date End Date Taking? Authorizing Provider  QUEtiapine  (SEROQUEL ) 50 MG tablet Take 1 tablet (50 mg total) by mouth 2 (two) times daily. 10/24/24 11/23/24 Yes Cleotilde Rogue, MD  allopurinol  (ZYLOPRIM ) 300 MG tablet TAKE 1 TABLET BY MOUTH DAILY 09/23/24   Bevely Doffing, FNP  ALPRAZolam  (XANAX ) 0.5 MG tablet Take 0.25-0.5 mg by mouth at bedtime as needed for sleep.    [provider]  cetirizine HCl (ZYRTEC) 1 MG/ML solution Take 10 mg by mouth daily. Patient taking differently: Take 10 mg by mouth daily. prn 12/19/23   [provider]  hydrocortisone  (ANUSOL -HC) 2.5 % rectal cream Place 1 Application rectally 2 (two) times daily. Take up to 10 days at a time, not for prolonged use. 09/09/24   Ezzard [name], PA-C  ketoconazole (NIZORAL) 2 % cream Apply 1 application topically daily as needed (foot rash).    [provider]  levothyroxine  (SYNTHROID ) 50 MCG tablet Take 50 mcg by mouth daily before breakfast. 05/26/18   [provider]   lisinopril -hydrochlorothiazide  (ZESTORETIC ) 20-12.5 MG tablet Take 1 tablet by mouth daily. 06/09/24   Bevely Doffing, FNP  polyethylene glycol powder (GLYCOLAX /MIRALAX ) 17 GM/SCOOP powder Take by mouth daily. 07/11/22   [provider]  traZODone  (DESYREL ) 100 MG tablet Take 1 tablet (100 mg total) by mouth daily. 08/10/24   Rankin, Shuvon B, NP    Allergies: Patient has no known allergies.    Review of Systems  All other systems reviewed and are negative.   Updated Vital Signs BP 111/81   Pulse 81   Temp 97.9 F (36.6 C) (Oral)   Resp 18   Ht 1.727 m (5' 8)   Wt 94.9 kg   SpO2 90%   BMI 31.81 kg/m   Physical Exam Vitals and nursing note reviewed.  Constitutional:      General: He is not in acute distress.    Appearance: He is well-developed.  HENT:     Head: Normocephalic and atraumatic.     Mouth/Throat:     Pharynx: No oropharyngeal exudate.  Eyes:     General: No scleral icterus.       Right eye: No discharge.        Left eye: No discharge.     Conjunctiva/sclera: Conjunctivae normal.     Pupils: Pupils are equal, round, and reactive to light.  Neck:     Thyroid : No thyromegaly.     Vascular: No JVD.  Cardiovascular:     Rate and  Rhythm: Normal rate and regular rhythm.     Heart sounds: Normal heart sounds. No murmur heard.    No friction rub. No gallop.  Pulmonary:     Effort: Pulmonary effort is normal. No respiratory distress.     Breath sounds: Normal breath sounds. No wheezing or rales.  Abdominal:     General: Bowel sounds are normal. There is no distension.     Palpations: Abdomen is soft. There is no mass.     Tenderness: There is no abdominal tenderness.  Musculoskeletal:        General: No tenderness. Normal range of motion.     Cervical back: Normal range of motion and neck supple.  Lymphadenopathy:     Cervical: No cervical adenopathy.  Skin:    General: Skin is warm and dry.     Findings: No erythema or rash.  Neurological:      Mental Status: He is alert.     Coordination: Coordination normal.     Comments: Follows commands without difficulty, seems mildly agitated in the room fiddling with things on the bed  Psychiatric:        Behavior: Behavior normal.     (all labs ordered are listed, but only abnormal results are displayed) Labs Reviewed  COMPREHENSIVE METABOLIC PANEL WITH GFR - Abnormal; Notable for the following components:      Result Value   Sodium 132 (*)    Chloride 96 (*)    CO2 21 (*)    Glucose, Bld 107 (*)    Creatinine, Ser 1.51 (*)    GFR, Estimated 46 (*)    All other components within normal limits  CBC WITH DIFFERENTIAL/PLATELET - Abnormal; Notable for the following components:   MCH 34.7 (*)    Abs Immature Granulocytes 0.08 (*)    All other components within normal limits  CBG MONITORING, ED - Abnormal; Notable for the following components:   Glucose-Capillary 123 (*)    All other components within normal limits  URINALYSIS, ROUTINE W REFLEX MICROSCOPIC  MAGNESIUM     EKG: None  Radiology: No results found.   Procedures   Medications Ordered in the ED  QUEtiapine  (SEROQUEL ) tablet 50 mg (has no administration in time range)                                    Medical Decision Making Amount and/or Complexity of Data Reviewed Labs: ordered.  Risk Prescription drug management.   Exams unremarkable, rule out organic cause, likely has some worsening dementia especially given the recent steroids.  Anticipate being able to start Seroquel , he does not appear violent at this time  Thankfully the lab work does not reveal anything that is particularly out of the ordinary, the patient's urinalysis was clean, metabolic panel showed a creatinine of 1.51 similar to what it was a year ago at 1.4, LFTs normal, CBC normal, magnesium  normal, glucose unremarkable.  Vital signs remain normal, the patient remains to be not agitated, he asks questions that are mildly confusing, I have  asked the family members to stay with him tonight, the patient will be given a dose of Seroquel  here and Seroquel  for home to help with some of the agitation.  The wife states that she is comfortable going home, they have someone that will be there to stay with them tomorrow, I have told her to call 911 to bring him back if things  are getting worse however at this time he does not appear to be violent or severely agitated at all.     Final diagnoses:  Agitation    ED Discharge Orders          Ordered    QUEtiapine  (SEROQUEL ) 50 MG tablet  2 times daily        10/24/24 1314               Cleotilde Rogue, MD 10/24/24 1315  "

## 2024-10-24 NOTE — Discharge Instructions (Signed)
 Please never take prednisone again, you can start taking Seroquel  1 dose in the morning, if there is increasing agitation throughout the day a second dose can be given at night.  It is very important that you follow-up with your doctor this week for ongoing management of this agitation related to both the dementia but also related to the recent prednisone, hopefully this will get better over time but if things are worsening or becoming more aggressive please return to the ER immediately

## 2024-10-26 ENCOUNTER — Telehealth: Payer: Self-pay

## 2024-10-26 NOTE — Telephone Encounter (Signed)
 Pts  wife  advised of correct times to be given

## 2024-10-26 NOTE — Telephone Encounter (Signed)
 Copied from CRM #[id number]. Topic: Clinical - Medication Question >> Oct 26, 2024  9:33 AM Delon HERO wrote: Reason for CRM: Patient's wife is calling to ask a question regarding QUEtiapine  (SEROQUEL ) 50 MG tablet [[id number]] prescribed at the ED 10/24/2024.   Question about the sig - On the bottle sig- 1 tablet 2 times daily.   On the AVS- Sig- 1 dose in the morning there is further agitation give a second dose.  In the hospital had a conversation about medication makes the patient too sleepy scale back.  Wife would like to know if the dosing is judgement based? Please advise.

## 2024-10-26 NOTE — Telephone Encounter (Signed)
 If it makes him too sleepy during the day, she can just give it to him at bedtime.

## 2024-10-28 ENCOUNTER — Encounter: Payer: Self-pay | Admitting: Family Medicine

## 2024-10-28 ENCOUNTER — Ambulatory Visit: Admitting: Family Medicine

## 2024-10-28 ENCOUNTER — Telehealth: Payer: Self-pay | Admitting: Neurology

## 2024-10-28 VITALS — BP 98/63 | HR 75 | Temp 97.5°F | Ht 68.0 in | Wt 206.6 lb

## 2024-10-28 DIAGNOSIS — R4189 Other symptoms and signs involving cognitive functions and awareness: Secondary | ICD-10-CM | POA: Diagnosis not present

## 2024-10-28 DIAGNOSIS — R451 Restlessness and agitation: Secondary | ICD-10-CM | POA: Diagnosis not present

## 2024-10-28 DIAGNOSIS — E559 Vitamin D deficiency, unspecified: Secondary | ICD-10-CM | POA: Diagnosis not present

## 2024-10-28 DIAGNOSIS — I10 Essential (primary) hypertension: Secondary | ICD-10-CM | POA: Diagnosis not present

## 2024-10-28 DIAGNOSIS — E039 Hypothyroidism, unspecified: Secondary | ICD-10-CM | POA: Diagnosis not present

## 2024-10-28 MED ORDER — REXULTI 0.5 MG PO TABS: 1.0000 | ORAL_TABLET | Freq: Every day | ORAL | 0 refills | Status: DC

## 2024-10-28 NOTE — Patient Instructions (Addendum)
 Stop seroquel .  Hold lisinopril /hydrochlorothiazide .  Follow up with PCP and neurology.

## 2024-10-28 NOTE — Progress Notes (Signed)
 "  Subjective:  Patient ID: Erik Odonnell, male    DOB: [birth year]  Age: 79 y.o. MRN: [id number]  CC:   Chief Complaint  Patient presents with   Foot Swelling    Both feet swelling since beginning of December, states not every day.    Altered Mental Status    Family c/o of patient being more confused / angry / irritable to the point of calling 911 and being see at the ER on 10/24/2024. ER gave pt seroquel  to take once a day but prescribe for twice a day. Pt has been taking twice a daily and constantly falling asleep (dosed off a handful of times while in the room). Pt was on presidone for sciatica when the confusion got worse. Papers on desk that family would like you to read.    HPI:  79 year old male presents for evaluation of the above.  Recent ER visit for agitation.  Was given Seroquel  by the ER.  Agitation occurred after he was placed on corticosteroids.  His wife states that he has seemed to be worsening since 10 December.  He has been more agitated and argumentative.  Wife is very concerned about this.  He is not on any medication regarding dementia at this time.  Since starting the Seroquel  he has been more sedated and drowsy.  He is feeling drowsy.  Today.  Patient also hypotensive, BP 85/57.  He has taken his blood pressure medication as well as Seroquel  today.  Patient Active Problem List   Diagnosis Date Noted   Constipation 09/09/2024   Primary insomnia 06/10/2024   IFG (impaired fasting glucose) 06/09/2024   Hypothyroidism 06/09/2024   Gout of ankle 06/09/2024   Vitamin D deficiency 06/09/2024   Pulmonary hypertension, unspecified (HCC) 11/24/2023   Coronary artery calcification 11/24/2023   Dizziness 11/24/2023   Aortic dilatation 11/24/2023   Degenerative disease of central nervous system 10/07/2023   Essential (primary) hypertension 10/07/2023   Primary osteoarthritis 10/07/2023   Hemorrhoids 09/11/2022   Hemifacial spasm of left side of face 05/18/2022    Cognitive impairment 05/08/2022   Primary osteoarthritis of hip 08/26/2018   Proximal leg weakness 04/07/2014   Difficulty walking 04/07/2014   Impairment of balance 04/07/2014   Primary localized osteoarthrosis, pelvic region and thigh 03/24/2014    Social Hx   Social History   Socioeconomic History   Marital status: Married    Spouse name: Not on file   Number of children: 2   Years of education: Not on file   Highest education level: Bachelor's degree (e.g., BA, AB, BS)  Occupational History   Not on file  Tobacco Use   Smoking status: Never   Smokeless tobacco: Never  Vaping Use   Vaping status: Never Used  Substance and Sexual Activity   Alcohol use: Yes    Alcohol/week: 7.0 standard drinks of alcohol    Types: 7 Glasses of wine per week   Drug use: No   Sexual activity: Yes    Partners: Female    Birth control/protection: None    Comment: married  Other Topics Concern   Not on file  Social History Narrative   Right handed   Caffiene- 2 cups daily   Married   Retired   Social Drivers of Health   Tobacco Use: Low Risk (10/24/2024)   Patient History    Smoking Tobacco Use: Never    Smokeless Tobacco Use: Never    Passive Exposure: Not on file  Financial Resource Strain:  Low Risk (06/05/2024)   Overall Financial Resource Strain (CARDIA)    Difficulty of Paying Living Expenses: Not hard at all  Food Insecurity: No Food Insecurity (06/05/2024)   Epic    Worried About Programme Researcher, Broadcasting/film/video in the Last Year: Never true    Ran Out of Food in the Last Year: Never true  Transportation Needs: No Transportation Needs (06/05/2024)   Epic    Lack of Transportation (Medical): No    Lack of Transportation (Non-Medical): No  Physical Activity: Not on file  Stress: Stress Concern Present (06/05/2024)   [employer] of Occupational Health - Occupational Stress Questionnaire    Feeling of Stress: To some extent  Social Connections: Unknown (06/05/2024)   Social Connection  and Isolation Panel    Frequency of Communication with Friends and Family: Not on file    Frequency of Social Gatherings with Friends and Family: Twice a week    Attends Religious Services: Patient declined    Active Member of Clubs or Organizations: No    Attends Engineer, Structural: Not on file    Marital Status: Married  Depression (PHQ2-9): High Risk (10/28/2024)   Depression (PHQ2-9)    PHQ-2 Score: 16  Alcohol Screen: Low Risk (08/10/2024)   Alcohol Screen    Last Alcohol Screening Score (AUDIT): 2  Housing: Low Risk (06/05/2024)   Epic    Unable to Pay for Housing in the Last Year: No    Number of Times Moved in the Last Year: 0    Homeless in the Last Year: No  Utilities: Not on file  Health Literacy: Not on file    Review of Systems Per HPI  Objective:  BP 98/63   Pulse 75   Temp (!) 97.5 F (36.4 C)   Ht 5' 8 (1.727 m)   Wt 206 lb 9.6 oz (93.7 kg)   SpO2 94%   BMI 31.41 kg/m      10/28/2024    1:19 PM 10/28/2024   10:05 AM 10/28/2024    9:24 AM  BP/Weight  Systolic BP 98 85 83  Diastolic BP 63 57 60  Wt. (Lbs)   206.6  BMI   31.41 kg/m2    Physical Exam  Lab Results  Component Value Date   WBC 6.7 10/24/2024   HGB 15.7 10/24/2024   HCT 44.7 10/24/2024   PLT 174 10/24/2024   GLUCOSE 107 (H) 10/24/2024   ALT 25 10/24/2024   AST 23 10/24/2024   NA 132 (L) 10/24/2024   K 4.3 10/24/2024   CL 96 (L) 10/24/2024   CREATININE 1.51 (H) 10/24/2024   BUN 23 10/24/2024   CO2 21 (L) 10/24/2024   INR 0.98 03/17/2014     Assessment & Plan:  Cognitive impairment Assessment & Plan: Appears to be progressing with associated mood disturbance.  Stopping Seroquel  given excessive drowsiness/sedation.  Trial of Rexulti .  Obtaining labs today.  Recommended follow-up with PCP and neurology.  Orders: -     Vitamin B12  Acquired hypothyroidism -     TSH  Essential (primary) hypertension Assessment & Plan: On recheck, patient's blood pressure was  98/63.  Advised to hold lisinopril /HCTZ tomorrow.  Labs today.  Orders: -     CMP14+EGFR  Vitamin D deficiency -     VITAMIN D 25 Hydroxy (Vit-D Deficiency, Fractures)  Agitation -     Rexulti ; Take 1 tablet (0.5 mg total) by mouth daily at 6 (six) AM.  Dispense: 30 tablet; Refill:  0    Ladonte Verstraete DO [facility] "

## 2024-10-28 NOTE — Telephone Encounter (Signed)
 Pt's wife is asking for a call to discuss worsening cognitive issues, pt saw a PCP in [place] [facility] [place] [place] today Dr Bluford who suggested she reach out to Dr Gregg re: concerns: increased confusion, irritability, agitation, wife states he gets along with everyone but her, pt is scheduled to see his PCP next week. Please call

## 2024-10-28 NOTE — Assessment & Plan Note (Addendum)
 Appears to be progressing with associated mood disturbance.  Stopping Seroquel  given excessive drowsiness/sedation.  Trial of Rexulti .  Obtaining labs today.  Recommended follow-up with PCP and neurology.

## 2024-10-28 NOTE — Assessment & Plan Note (Signed)
 On recheck, patient's blood pressure was 98/63.  Advised to hold lisinopril /HCTZ tomorrow.  Labs today.

## 2024-10-29 ENCOUNTER — Telehealth: Payer: Self-pay | Admitting: Pharmacy Technician

## 2024-10-29 ENCOUNTER — Ambulatory Visit: Payer: Self-pay | Admitting: Family Medicine

## 2024-10-29 ENCOUNTER — Other Ambulatory Visit (HOSPITAL_COMMUNITY): Payer: Self-pay

## 2024-10-29 LAB — CMP14+EGFR
ALT: 20 IU/L (ref 0–44)
AST: 17 IU/L (ref 0–40)
Albumin: 4.3 g/dL (ref 3.8–4.8)
Alkaline Phosphatase: 59 IU/L (ref 47–123)
BUN/Creatinine Ratio: 16 (ref 10–24)
BUN: 25 mg/dL (ref 8–27)
Bilirubin Total: 0.7 mg/dL (ref 0.0–1.2)
CO2: 23 mmol/L (ref 20–29)
Calcium: 10 mg/dL (ref 8.6–10.2)
Chloride: 94 mmol/L — ABNORMAL LOW (ref 96–106)
Creatinine, Ser: 1.55 mg/dL — ABNORMAL HIGH (ref 0.76–1.27)
Globulin, Total: 2.3 g/dL (ref 1.5–4.5)
Glucose: 96 mg/dL (ref 70–99)
Potassium: 4.3 mmol/L (ref 3.5–5.2)
Sodium: 131 mmol/L — ABNORMAL LOW (ref 134–144)
Total Protein: 6.6 g/dL (ref 6.0–8.5)
eGFR: 45 mL/min/1.73 — ABNORMAL LOW

## 2024-10-29 LAB — VITAMIN D 25 HYDROXY (VIT D DEFICIENCY, FRACTURES): Vit D, 25-Hydroxy: 48.1 ng/mL (ref 30.0–100.0)

## 2024-10-29 LAB — VITAMIN B12: Vitamin B-12: 855 pg/mL (ref 232–1245)

## 2024-10-29 LAB — TSH: TSH: 1.9 u[IU]/mL (ref 0.450–4.500)

## 2024-10-29 NOTE — Telephone Encounter (Signed)
 Pharmacy Patient Advocate Encounter   Received notification from [facility] KEY that prior authorization for Rexulti  0.5mg  is required/requested.   Insurance verification completed.   The patient is insured through [employer].   Per test claim: PA required; PA submitted to above mentioned insurance via Latent Key/confirmation #/EOC [id number] Status is pending

## 2024-10-29 NOTE — Telephone Encounter (Signed)
 Pharmacy Patient Advocate Encounter  Received notification from [employer] that Prior Authorization for Rexulti  0.5mg  has been APPROVED from 10/22/24 to 10/21/25. Ran test claim, Copay is $100. This test claim was processed through [facility]- copay amounts may vary at other pharmacies due to pharmacy/plan contracts, or as the patient moves through the different stages of their insurance plan.   PA #/Case ID/Reference #: [id number]

## 2024-11-02 NOTE — Telephone Encounter (Signed)
 Spoke with pt briefly. He was very calm on phone and said he was doing fine. He said his wife was out of the house getting some fresh air. He does feel he has poor short term memory. He thinks he saw audiology but cannot recall details from visit.   Called wife. She said he started a 6 day prednisone pack for his sciatica and finished it on 10/14/24. He started having some behavior issues which escalated on 1/2,1/3. She stated he was irrational and angry. She took his keys away. She took him to ER on 1/3. She had first called 911 but a deputy showed up instead of EMS and that made him worse. ER placed him on Seroquel  but it did not work for him. I also saw note that it made him too sleepy. She says ER ruled out infection. She states patient is better now though. He can have reasonable conversations. She does feel he is even better with others around him rather than with her. He has been on Trazodone  100 mg nightly which helps his agitation that was present before this recent incident. He refuses to wear hearing aid. He seems to get more confused around 8-9 pm at night. When he gets tired that's when she notices more of an issue. Pt sees pcp this Wednesday. Last PCP retired. Patient was worked in with Dr Bluford last week who recommended f/u with pcp and neurology. Pt currently scheduled for f/u with us  in July. She asked if there are any other meds Dr Gregg recommends although she hesitates causing more side effects. Also mentioned pt has some depression. He has seen behavioral health before but she was dissatisfied by the way the appointment seemed to go. I did mention to her she could consider geriatric psychiatrist. She thanked me for the call. She will see what pcp says Wednesday.

## 2024-11-02 NOTE — Telephone Encounter (Signed)
 Noted. We can add them to the cancellation list.

## 2024-11-04 ENCOUNTER — Ambulatory Visit: Payer: Self-pay

## 2024-11-04 VITALS — BP 114/72 | HR 75 | Resp 16 | Ht 68.0 in | Wt 207.4 lb

## 2024-11-04 DIAGNOSIS — F5101 Primary insomnia: Secondary | ICD-10-CM | POA: Diagnosis not present

## 2024-11-04 DIAGNOSIS — F330 Major depressive disorder, recurrent, mild: Secondary | ICD-10-CM | POA: Diagnosis not present

## 2024-11-04 DIAGNOSIS — G4700 Insomnia, unspecified: Secondary | ICD-10-CM

## 2024-11-04 DIAGNOSIS — R4189 Other symptoms and signs involving cognitive functions and awareness: Secondary | ICD-10-CM

## 2024-11-04 DIAGNOSIS — E039 Hypothyroidism, unspecified: Secondary | ICD-10-CM

## 2024-11-04 MED ORDER — LEVOTHYROXINE SODIUM 50 MCG PO TABS: 50.0000 ug | ORAL_TABLET | Freq: Every day | ORAL | 3 refills | Status: AC

## 2024-11-04 NOTE — Progress Notes (Signed)
 "  Established Patient Office Visit  Subjective   Patient ID: Erik Odonnell, male    DOB: 24-May-1946  Age: 79 y.o. MRN: [id number]  Chief Complaint  Patient presents with   ER follow up    On 10/25/24 for agitation. Diagnosed with cognitive impairement in 2023 Wife reports memory has also gotten worse, has trouble remembering names but his mood has improved. Had taken prednisone in Dec and ER thinks it may have contributed     HPI Discussed the use of AI scribe software for clinical note transcription with the patient, who gave verbal consent to proceed.  History of Present Illness    Erik Odonnell is an 79 year old male with sciatica and cognitive changes who presents with increased agitation and confusion. He is accompanied by his wife, who is his primary caregiver.  Cognitive impairment and neuropsychiatric symptoms - Increased agitation and confusion, particularly since a recent course of prednisone for sciatica flare-up - Requires guidance to the bathroom at night due to confusion - Symptoms of 'sundowning' with increased agitation in the evenings - History of cognitive decline with worsening symptoms in the fall, similar to previous year - Increased daytime sleepiness and memory impairment - Difficulty with word recall and recognizing familiar names, including his grandson's name - Wife has assumed responsibility for finances and household maintenance due to cognitive decline - Agitation worsens when tired or stressed - Recent medication adjustments, including trazodone  for sleep and a trial of Seroquel , which was ineffective - Recent agitation and confusion have persisted despite medication changes  Sciatica and mobility - Recent flare-up of sciatica, initially relieved by prednisone - Decreased activity due to cold weather and sciatica pain - Previously active with walking and Silver [employer]neakers at the [facility], now more sedentary - Increased daytime napping due to reduced  activity  Medication effects and history - Recent course of prednisone for sciatica, last dose on October 14, 2024, associated with onset of confusion and agitation - Trazodone  used for sleep, with improvement in nighttime rest - Trial of Seroquel  for agitation, not effective - Takes levothyroxine  for thyroid  management - Takes Zyrtec daily for allergies  Electrolyte and renal abnormalities - History of hyponatremia and decreased kidney function on recent blood tests - Advised to maintain hydration, but dislikes sports drinks  Functional status and safety concerns - Wife is primary caregiver and has taken over daily responsibilities - History of driving fast, raising safety concerns given current cognitive and behavioral symptoms     Patient Active Problem List   Diagnosis Date Noted   Mild recurrent major depression 11/07/2024   Constipation 09/09/2024   Insomnia 06/10/2024   IFG (impaired fasting glucose) 06/09/2024   Hypothyroidism 06/09/2024   Gout of ankle 06/09/2024   Vitamin D  deficiency 06/09/2024   Pulmonary hypertension, unspecified (HCC) 11/24/2023   Coronary artery calcification 11/24/2023   Dizziness 11/24/2023   Aortic dilatation 11/24/2023   Degenerative disease of central nervous system 10/07/2023   Essential (primary) hypertension 10/07/2023   Primary osteoarthritis 10/07/2023   Hemorrhoids 09/11/2022   Hemifacial spasm of left side of face 05/18/2022   Cognitive impairment 05/08/2022   Primary osteoarthritis of hip 08/26/2018   Proximal leg weakness 04/07/2014   Difficulty walking 04/07/2014   Impairment of balance 04/07/2014   Primary localized osteoarthrosis, pelvic region and thigh 03/24/2014   ROS    Objective:     BP 114/72   Pulse 75   Resp 16   Ht 5' 8 (1.727  m)   Wt 207 lb 6.4 oz (94.1 kg)   SpO2 92%   BMI 31.54 kg/m  BP Readings from Last 3 Encounters:  11/04/24 114/72  10/28/24 98/63  10/24/24 118/70   Wt Readings from Last 3  Encounters:  11/04/24 207 lb 6.4 oz (94.1 kg)  10/28/24 206 lb 9.6 oz (93.7 kg)  10/24/24 209 lb 3.5 oz (94.9 kg)     Physical Exam Vitals and nursing note reviewed. Exam conducted with a chaperone present (pt's wife).  Constitutional:      Appearance: Normal appearance.  HENT:     Head: Normocephalic.  Eyes:     Extraocular Movements: Extraocular movements intact.     Pupils: Pupils are equal, round, and reactive to light.  Cardiovascular:     Rate and Rhythm: Normal rate and regular rhythm.  Pulmonary:     Effort: Pulmonary effort is normal.     Breath sounds: Normal breath sounds.  Musculoskeletal:     Cervical back: Normal range of motion and neck supple.  Neurological:     Mental Status: He is alert and oriented to person, place, and time.  Psychiatric:        Attention and Perception: Attention normal.        Mood and Affect: Mood normal.        Behavior: Behavior is slowed.        Thought Content: Thought content normal.        Cognition and Memory: Memory is impaired.      No results found for any visits on 11/04/24.    The ASCVD Risk score (Arnett DK, et al., 2019) failed to calculate for the following reasons:   The 2019 ASCVD risk score is only valid for ages 53 to 64   * - Cholesterol units were assumed    Assessment & Plan:   Problem List Items Addressed This Visit       Endocrine   Hypothyroidism (Chronic)   Thyroid  function well-managed with current medication. - Continue current thyroid  medication regimen.      Relevant Medications   levothyroxine  (SYNTHROID ) 50 MCG tablet     Other   Cognitive impairment   Agitation and confusion improved after stopping prednisone. Trazodone  effective for sleep and mood. Seroquel  ineffective. Rexulti  not initiated due to cost.  - Continue trazodone  for sleep and mood stabilization. - Avoid prednisone and other steroids unless necessary. - Use light therapy in the evening for agitation. - Monitor  cognitive status and agitation levels. - Contact provider if agitation worsens or driving safety concerns arise.      Insomnia   Improved with trazodone , effective in regulating sleep patterns. - Continue trazodone  for sleep regulation.      Mild recurrent major depression - Primary   Patient was prescribed Rexulti  recently after behavioral issues that were a result of steroid use for sciatica.  Pt and his wife are reluctant to start this medication d/t possible adverse reaction to medication.  His wife states that he is back to baseline after recent steroid administration.  She will let us  know in future if she feels that he needs something else.         No follow-ups on file.    Leita Longs, FNP  "

## 2024-11-07 DIAGNOSIS — F330 Major depressive disorder, recurrent, mild: Secondary | ICD-10-CM | POA: Insufficient documentation

## 2024-11-07 NOTE — Assessment & Plan Note (Signed)
 Patient was prescribed Rexulti  recently after behavioral issues that were a result of steroid use for sciatica.  Pt and his wife are reluctant to start this medication d/t possible adverse reaction to medication.  His wife states that he is back to baseline after recent steroid administration.  She will let us  know in future if she feels that he needs something else.

## 2024-11-07 NOTE — Assessment & Plan Note (Signed)
 Agitation and confusion improved after stopping prednisone. Trazodone  effective for sleep and mood. Seroquel  ineffective. Rexulti  not initiated due to cost.  - Continue trazodone  for sleep and mood stabilization. - Avoid prednisone and other steroids unless necessary. - Use light therapy in the evening for agitation. - Monitor cognitive status and agitation levels. - Contact provider if agitation worsens or driving safety concerns arise.

## 2024-11-07 NOTE — Assessment & Plan Note (Signed)
 Improved with trazodone , effective in regulating sleep patterns. - Continue trazodone  for sleep regulation.

## 2024-11-07 NOTE — Assessment & Plan Note (Signed)
 Thyroid  function well-managed with current medication. - Continue current thyroid  medication regimen.

## 2024-11-13 ENCOUNTER — Ambulatory Visit: Admitting: Internal Medicine

## 2024-11-18 ENCOUNTER — Ambulatory Visit (HOSPITAL_COMMUNITY): Admitting: Psychiatry

## 2024-12-01 ENCOUNTER — Ambulatory Visit (HOSPITAL_COMMUNITY): Admitting: Psychiatry

## 2024-12-02 ENCOUNTER — Ambulatory Visit: Admitting: Gastroenterology

## 2024-12-03 ENCOUNTER — Ambulatory Visit: Admitting: Internal Medicine

## 2024-12-15 ENCOUNTER — Ambulatory Visit (HOSPITAL_COMMUNITY): Admitting: Psychiatry

## 2024-12-29 ENCOUNTER — Ambulatory Visit (HOSPITAL_COMMUNITY): Admitting: Psychiatry

## 2025-05-10 ENCOUNTER — Ambulatory Visit: Admitting: Neurology

## 2025-08-23 ENCOUNTER — Ambulatory Visit: Admitting: Urology
